# Patient Record
Sex: Male | Born: 1985 | Race: White | Hispanic: No | Marital: Single | Smoking: Former smoker
Health system: Southern US, Community
[De-identification: ages and names within clinical notes are randomized; demographics above are authoritative.]

## PROBLEM LIST (undated history)

## (undated) DIAGNOSIS — G709 Myoneural disorder, unspecified: Secondary | ICD-10-CM

## (undated) DIAGNOSIS — T8859XA Other complications of anesthesia, initial encounter: Secondary | ICD-10-CM

## (undated) DIAGNOSIS — I959 Hypotension, unspecified: Secondary | ICD-10-CM

## (undated) DIAGNOSIS — T4145XA Adverse effect of unspecified anesthetic, initial encounter: Secondary | ICD-10-CM

## (undated) DIAGNOSIS — R479 Unspecified speech disturbances: Secondary | ICD-10-CM

## (undated) DIAGNOSIS — G8250 Quadriplegia, unspecified: Secondary | ICD-10-CM

## (undated) DIAGNOSIS — K592 Neurogenic bowel, not elsewhere classified: Secondary | ICD-10-CM

## (undated) DIAGNOSIS — N319 Neuromuscular dysfunction of bladder, unspecified: Secondary | ICD-10-CM

## (undated) HISTORY — DX: Unspecified speech disturbances: R47.9

---

## 2005-04-27 ENCOUNTER — Emergency Department (HOSPITAL_COMMUNITY): Admission: EM | Admit: 2005-04-27 | Discharge: 2005-04-27 | Payer: Self-pay | Admitting: Emergency Medicine

## 2007-12-13 DIAGNOSIS — G8250 Quadriplegia, unspecified: Secondary | ICD-10-CM

## 2007-12-13 HISTORY — PX: OTHER SURGICAL HISTORY: SHX169

## 2007-12-13 HISTORY — DX: Quadriplegia, unspecified: G82.50

## 2009-06-19 ENCOUNTER — Ambulatory Visit (HOSPITAL_COMMUNITY): Admission: RE | Admit: 2009-06-19 | Discharge: 2009-06-19 | Payer: Self-pay | Admitting: Urology

## 2009-10-11 ENCOUNTER — Ambulatory Visit: Payer: Self-pay | Admitting: Diagnostic Radiology

## 2009-10-11 ENCOUNTER — Emergency Department (HOSPITAL_BASED_OUTPATIENT_CLINIC_OR_DEPARTMENT_OTHER): Admission: EM | Admit: 2009-10-11 | Discharge: 2009-10-11 | Payer: Self-pay | Admitting: Emergency Medicine

## 2009-12-12 HISTORY — PX: OTHER SURGICAL HISTORY: SHX169

## 2010-04-09 ENCOUNTER — Emergency Department (HOSPITAL_BASED_OUTPATIENT_CLINIC_OR_DEPARTMENT_OTHER): Admission: EM | Admit: 2010-04-09 | Discharge: 2010-04-09 | Payer: Self-pay | Admitting: Emergency Medicine

## 2010-08-05 ENCOUNTER — Emergency Department (HOSPITAL_BASED_OUTPATIENT_CLINIC_OR_DEPARTMENT_OTHER): Admission: EM | Admit: 2010-08-05 | Discharge: 2010-08-05 | Payer: Self-pay | Admitting: Emergency Medicine

## 2010-09-10 ENCOUNTER — Ambulatory Visit (HOSPITAL_COMMUNITY): Admission: RE | Admit: 2010-09-10 | Discharge: 2010-09-10 | Payer: Self-pay | Admitting: Gastroenterology

## 2010-12-12 HISTORY — PX: OTHER SURGICAL HISTORY: SHX169

## 2011-02-22 ENCOUNTER — Emergency Department (HOSPITAL_BASED_OUTPATIENT_CLINIC_OR_DEPARTMENT_OTHER)
Admission: EM | Admit: 2011-02-22 | Discharge: 2011-02-22 | Disposition: A | Discharge status: Nursing Home (Includes ICF) | Payer: Medicare Other | Source: Home / Self Care | Attending: Emergency Medicine | Admitting: Emergency Medicine

## 2011-02-22 ENCOUNTER — Inpatient Hospital Stay (HOSPITAL_COMMUNITY)
Admission: AD | Admit: 2011-02-22 | Discharge: 2011-02-25 | Disposition: A | Discharge status: Home Health | Payer: Medicare Other | Source: Other Acute Inpatient Hospital | Attending: Internal Medicine | Admitting: Internal Medicine

## 2011-02-22 ENCOUNTER — Emergency Department (INDEPENDENT_AMBULATORY_CARE_PROVIDER_SITE_OTHER): Payer: Medicare Other

## 2011-02-22 DIAGNOSIS — R1013 Epigastric pain: Secondary | ICD-10-CM | POA: Diagnosis present

## 2011-02-22 DIAGNOSIS — R509 Fever, unspecified: Secondary | ICD-10-CM | POA: Insufficient documentation

## 2011-02-22 DIAGNOSIS — Y929 Unspecified place or not applicable: Secondary | ICD-10-CM

## 2011-02-22 DIAGNOSIS — J189 Pneumonia, unspecified organism: Secondary | ICD-10-CM | POA: Diagnosis present

## 2011-02-22 DIAGNOSIS — N319 Neuromuscular dysfunction of bladder, unspecified: Secondary | ICD-10-CM | POA: Diagnosis present

## 2011-02-22 DIAGNOSIS — W19XXXS Unspecified fall, sequela: Secondary | ICD-10-CM

## 2011-02-22 DIAGNOSIS — A419 Sepsis, unspecified organism: Secondary | ICD-10-CM | POA: Diagnosis present

## 2011-02-22 DIAGNOSIS — R0602 Shortness of breath: Secondary | ICD-10-CM | POA: Insufficient documentation

## 2011-02-22 DIAGNOSIS — K5900 Constipation, unspecified: Secondary | ICD-10-CM | POA: Diagnosis present

## 2011-02-22 DIAGNOSIS — E876 Hypokalemia: Secondary | ICD-10-CM | POA: Diagnosis present

## 2011-02-22 DIAGNOSIS — J40 Bronchitis, not specified as acute or chronic: Secondary | ICD-10-CM | POA: Insufficient documentation

## 2011-02-22 DIAGNOSIS — R109 Unspecified abdominal pain: Secondary | ICD-10-CM

## 2011-02-22 DIAGNOSIS — R059 Cough, unspecified: Secondary | ICD-10-CM

## 2011-02-22 DIAGNOSIS — IMO0002 Reserved for concepts with insufficient information to code with codable children: Secondary | ICD-10-CM

## 2011-02-22 DIAGNOSIS — G8250 Quadriplegia, unspecified: Secondary | ICD-10-CM

## 2011-02-22 DIAGNOSIS — Z79899 Other long term (current) drug therapy: Secondary | ICD-10-CM

## 2011-02-22 DIAGNOSIS — M62838 Other muscle spasm: Secondary | ICD-10-CM | POA: Diagnosis present

## 2011-02-22 DIAGNOSIS — R062 Wheezing: Secondary | ICD-10-CM | POA: Insufficient documentation

## 2011-02-22 DIAGNOSIS — G904 Autonomic dysreflexia: Secondary | ICD-10-CM | POA: Diagnosis present

## 2011-02-22 DIAGNOSIS — X58XXXS Exposure to other specified factors, sequela: Secondary | ICD-10-CM

## 2011-02-22 DIAGNOSIS — E860 Dehydration: Secondary | ICD-10-CM | POA: Diagnosis present

## 2011-02-22 DIAGNOSIS — R05 Cough: Secondary | ICD-10-CM

## 2011-02-22 LAB — URINALYSIS, ROUTINE W REFLEX MICROSCOPIC
Ketones, ur: >80 mg/dL — AB
Nitrite: NEGATIVE
Protein, ur: NEGATIVE mg/dL
Urobilinogen, UA: 0.2 mg/dL (ref 0.0–1.0)

## 2011-02-22 LAB — COMPREHENSIVE METABOLIC PANEL
Alkaline Phosphatase: 51 U/L (ref 39–117)
BUN: 15 mg/dL (ref 6–23)
Chloride: 104 mEq/L (ref 96–112)
GFR calc non Af Amer: >60 mL/min (ref >60)
Glucose, Bld: 97 mg/dL (ref 70–99)
Potassium: 3.7 mEq/L (ref 3.5–5.1)
Total Bilirubin: 0.7 mg/dL (ref 0.3–1.2)

## 2011-02-22 LAB — CBC
MCV: 83.4 fL (ref 78.0–100.0)
Platelets: 193 10*3/uL (ref 150–400)
RDW: 13.5 % (ref 11.5–15.5)
WBC: 12.4 10*3/uL — ABNORMAL HIGH (ref 4.0–10.5)

## 2011-02-22 LAB — DIFFERENTIAL
Basophils Absolute: 0 10*3/uL (ref 0.0–0.1)
Eosinophils Absolute: 0 10*3/uL (ref 0.0–0.7)
Eosinophils Relative: 0 % (ref 0–5)
Lymphs Abs: 1.3 10*3/uL (ref 0.7–4.0)

## 2011-02-22 LAB — LIPASE, BLOOD: Lipase: 37 U/L (ref 23–300)

## 2011-02-23 LAB — BASIC METABOLIC PANEL
BUN: 8 mg/dL (ref 6–23)
CO2: 25 mEq/L (ref 19–32)
Glucose, Bld: 121 mg/dL — ABNORMAL HIGH (ref 70–99)
Potassium: 3.4 mEq/L — ABNORMAL LOW (ref 3.5–5.1)
Sodium: 140 mEq/L (ref 135–145)

## 2011-02-23 LAB — CBC
MCHC: 32.3 g/dL (ref 30.0–36.0)
RBC: 4.16 MIL/uL — ABNORMAL LOW (ref 4.22–5.81)
RDW: 13.7 % (ref 11.5–15.5)

## 2011-02-23 LAB — URINE CULTURE
Colony Count: NO GROWTH
Culture  Setup Time: 201203132005
Culture: NO GROWTH

## 2011-02-23 LAB — INFLUENZA PANEL BY PCR (TYPE A & B): Influenza B By PCR: NEGATIVE

## 2011-02-24 ENCOUNTER — Inpatient Hospital Stay (HOSPITAL_COMMUNITY): Payer: Medicare Other

## 2011-02-24 LAB — BASIC METABOLIC PANEL
CO2: 25 mEq/L (ref 19–32)
Calcium: 7.7 mg/dL — ABNORMAL LOW (ref 8.4–10.5)
Creatinine, Ser: 0.43 mg/dL (ref 0.4–1.5)
GFR calc Af Amer: >60 mL/min (ref >60)
GFR calc non Af Amer: >60 mL/min (ref >60)
Sodium: 141 mEq/L (ref 135–145)

## 2011-02-24 LAB — STREP A DNA PROBE

## 2011-02-24 LAB — CBC
MCH: 29.1 pg (ref 26.0–34.0)
MCHC: 32.7 g/dL (ref 30.0–36.0)
Platelets: 133 10*3/uL — ABNORMAL LOW (ref 150–400)
RDW: 14 % (ref 11.5–15.5)

## 2011-02-24 MED ORDER — IOHEXOL 300 MG/ML  SOLN
100.0000 mL | Freq: Once | INTRAMUSCULAR | Status: AC | PRN
  Administered 2011-02-24: 100 mL via INTRAVENOUS

## 2011-02-25 ENCOUNTER — Inpatient Hospital Stay (HOSPITAL_COMMUNITY)
Admission: EM | Admit: 2011-02-25 | Discharge: 2011-02-28 | DRG: 871 | Disposition: A | Discharge status: Home Health | Payer: Medicare Other | Source: Other Acute Inpatient Hospital | Attending: Internal Medicine | Admitting: Internal Medicine

## 2011-02-25 ENCOUNTER — Inpatient Hospital Stay (HOSPITAL_COMMUNITY)
Admission: AD | Admit: 2011-02-25 | Payer: Self-pay | Source: Other Acute Inpatient Hospital | Admitting: Internal Medicine

## 2011-02-25 ENCOUNTER — Inpatient Hospital Stay (HOSPITAL_COMMUNITY): Payer: Medicare Other

## 2011-02-25 LAB — CBC
MCH: 28.8 pg (ref 26.0–34.0)
MCHC: 33 g/dL (ref 30.0–36.0)
MCV: 87.5 fL (ref 78.0–100.0)
Platelets: 141 10*3/uL — ABNORMAL LOW (ref 150–400)
RBC: 4.16 MIL/uL — ABNORMAL LOW (ref 4.22–5.81)

## 2011-02-25 LAB — BASIC METABOLIC PANEL
BUN: 1 mg/dL — ABNORMAL LOW (ref 6–23)
Calcium: 8.3 mg/dL — ABNORMAL LOW (ref 8.4–10.5)
GFR calc non Af Amer: >60 mL/min (ref >60)
Glucose, Bld: 105 mg/dL — ABNORMAL HIGH (ref 70–99)
Sodium: 142 mEq/L (ref 135–145)

## 2011-02-26 LAB — CBC
MCH: 28.2 pg (ref 26.0–34.0)
MCV: 84.2 fL (ref 78.0–100.0)
Platelets: 181 10*3/uL (ref 150–400)
RDW: 13.5 % (ref 11.5–15.5)

## 2011-02-26 LAB — BASIC METABOLIC PANEL
BUN: 3 mg/dL — ABNORMAL LOW (ref 6–23)
Creatinine, Ser: 0.45 mg/dL (ref 0.4–1.5)
GFR calc non Af Amer: >60 mL/min (ref >60)

## 2011-02-26 LAB — MAGNESIUM: Magnesium: 2.2 mg/dL (ref 1.5–2.5)

## 2011-02-27 LAB — BASIC METABOLIC PANEL
BUN: 3 mg/dL — ABNORMAL LOW (ref 6–23)
Calcium: 8.4 mg/dL (ref 8.4–10.5)
GFR calc non Af Amer: >60 mL/min (ref >60)
Glucose, Bld: 100 mg/dL — ABNORMAL HIGH (ref 70–99)

## 2011-02-28 LAB — CULTURE, BLOOD (ROUTINE X 2)
Culture  Setup Time: 201203132001
Culture  Setup Time: 201203132001
Culture: NO GROWTH
Culture: NO GROWTH

## 2011-03-03 ENCOUNTER — Emergency Department (HOSPITAL_COMMUNITY)
Admission: EM | Admit: 2011-03-03 | Discharge: 2011-03-03 | Disposition: A | Discharge status: Home / Self Care | Payer: Medicare Other | Attending: Emergency Medicine | Admitting: Emergency Medicine

## 2011-03-03 DIAGNOSIS — IMO0002 Reserved for concepts with insufficient information to code with codable children: Secondary | ICD-10-CM | POA: Insufficient documentation

## 2011-03-03 DIAGNOSIS — G904 Autonomic dysreflexia: Secondary | ICD-10-CM | POA: Insufficient documentation

## 2011-03-03 DIAGNOSIS — X58XXXS Exposure to other specified factors, sequela: Secondary | ICD-10-CM | POA: Insufficient documentation

## 2011-03-03 DIAGNOSIS — Y846 Urinary catheterization as the cause of abnormal reaction of the patient, or of later complication, without mention of misadventure at the time of the procedure: Secondary | ICD-10-CM | POA: Insufficient documentation

## 2011-03-03 DIAGNOSIS — T83091A Other mechanical complication of indwelling urethral catheter, initial encounter: Secondary | ICD-10-CM | POA: Insufficient documentation

## 2011-03-03 DIAGNOSIS — G8250 Quadriplegia, unspecified: Secondary | ICD-10-CM | POA: Insufficient documentation

## 2011-03-03 LAB — BASIC METABOLIC PANEL
Calcium: 9.2 mg/dL (ref 8.4–10.5)
Creatinine, Ser: 0.58 mg/dL (ref 0.4–1.5)
GFR calc Af Amer: >60 mL/min (ref >60)
GFR calc non Af Amer: >60 mL/min (ref >60)

## 2011-03-03 LAB — URINALYSIS, ROUTINE W REFLEX MICROSCOPIC
Protein, ur: 100 mg/dL — AB
Urobilinogen, UA: 0.2 mg/dL (ref 0.0–1.0)

## 2011-03-03 LAB — DIFFERENTIAL
Basophils Relative: 0 % (ref 0–1)
Eosinophils Absolute: 0.3 10*3/uL (ref 0.0–0.7)
Monocytes Absolute: 1.2 10*3/uL — ABNORMAL HIGH (ref 0.1–1.0)
Monocytes Relative: 12 % (ref 3–12)
Neutro Abs: 5.6 10*3/uL (ref 1.7–7.7)

## 2011-03-03 LAB — PROTIME-INR: Prothrombin Time: 13.7 seconds (ref 11.6–15.2)

## 2011-03-03 LAB — CBC
Hemoglobin: 13.6 g/dL (ref 13.0–17.0)
MCH: 28.8 pg (ref 26.0–34.0)
MCHC: 33.5 g/dL (ref 30.0–36.0)
MCV: 85.8 fL (ref 78.0–100.0)

## 2011-03-03 LAB — URINE MICROSCOPIC-ADD ON

## 2011-03-03 LAB — ABO/RH: ABO/RH(D): A POS

## 2011-03-03 NOTE — Discharge Summary (Signed)
Curtis Mann, Curtis Mann NO.:  [id number]  MEDICAL RECORD NO.:  [id number]           PATIENT TYPE:  I  LOCATION:  5503                         FACILITY:  [facility]  PHYSICIAN:  Marinda Elk, M.D.DATE OF BIRTH:  [birth year]  DATE OF ADMISSION:  02/25/2011 DATE OF DISCHARGE:  02/28/2011                              DISCHARGE SUMMARY   PRIMARY CARE DOCTOR:  Dr. Florentina Jenny.  DISCHARGE DIAGNOSES: 1. Systemic inflammatory response syndrome. 2. Community-acquired pneumonia. 3. Abdominal pain. 4. Quadriplegia. 5. Hypokalemia.  DISCHARGE MEDICATIONS: 1. Tylenol 650 mg q.4 h. p.r.n. 2. Levaquin 750 mg p.o. daily for 5 days. 3. Senokot 2 tabs daily as needed. 4. Baclofen 20 mg one tab t.i.d. 5. Hydrocodone 5/500 one tab q.6 h. p.r.n. 6. Multivitamin 1 tab daily. 7. Oxybutynin XL 1 tab b.i.d.  PROCEDURES PERFORMED: 1. Abdominal x-ray showed no evidence of gallstone or other     cholecystitis, mild splenomegaly, small pleural effusion. 2. CT scan of the abdomen and pelvis showed left lower lobe pneumonia     with portable edema within the liver, pericholecystic fluid in the     abdomen.  These finding may represent volume overload. 3. Chest x-ray showed lower lobe pneumonia. 4. Abdominal x-ray with possible constipation.  No acute findings. 5. Chest x-ray showed hyperinflation with acute disease.  CONSULTANTS:  None.  BRIEF ADMITTING HISTORY AND PHYSICAL:  This 25 year old male with a C4 through C6 fracture in 2009 without dysreflexia and neurogenic bladder. The patient states that yesterday he started noticing some abdominal pain.  He said that he has spasm in his legs, leg spasm of its own, squeezing of his testicles and he thought he may be having pain radiating from his stomach.  He had some retching.  He was able to eat yesterday, but did not eat much today.  He has subsequently noted some wheezing.  He has not had much cough.  He states his diaphragm is  weak, however, he does admit to having sore throat, a temperature of 102. Please refer to dictation of February 22, 2011, for further details.  LABORATORY DATA ON ADMISSION:  White count of 12.4.  Lactic acid 0.7. His sodium was 142, potassium 3.7, chloride 23, glucose of 97, BUN of 15, creatinine 0.5.  LFTs within normal limits.  His lipase is 37.  His UA shows positive for ketone.  His white count is 12.4, hemoglobin of 13.9, platelet count of 193, ANC of 9.5.  His procalcitonin is less than 10.  HOSPITAL COURSE: 1. Community-acquired pneumonia.  On admission, he was admitted to     [facility].  He was put on Avelox.  With continuing spike     of fevers for 3 days, he was changed to vanco and Zosyn.  Then, he     was transferred to [facility] as per family request.  He was kept     on vanco and Zosyn for 3 days.  He did not spike any more fevers.     He was changed to Levaquin that he will continue for 5 days.  He  was discharged in stable condition. 2. SIRS probably secondary to pneumonia.  This has resolved.  At the    beginning, he was little bit hypotensive.  He responded to IV     fluids.  His strep throat was negative.  Influenza PCR was also     negative.  This was probably secondary to pneumonia.  This was     resolved with IV fluids. 3. Abdominal pain probably referred from an community-acquired     pneumonia.  This has resolved.  CT scan of the abdomen were done as     above.  LFTs remained within normal limits. 4. Hypokalemia.  This was probably secondary to dehydration.  This has     resolved. 5. Constipation.  He will remain on MiraLax and Senokot.  This has     resolved.  He will go home on Senokot p.r.n.  Vitals on the day of discharge shows temperature of 97, pulse 65, respirations 18, blood pressure 115/61 and he was satting 97% on room air.  Labs on day of discharge shows none.     Marinda Elk, M.D.     AF/MEDQ  D:  02/28/2011  T:   03/01/2011  Job:  [id number]  Electronically Signed by Lambert Keto M.D. on 03/03/2011 08:11:17 AM

## 2011-03-04 LAB — CROSSMATCH
Antibody Screen: NEGATIVE
Unit division: 0

## 2011-03-05 NOTE — H&P (Addendum)
NAMECONSUELO, Mann NO.:  [id number]  MEDICAL RECORD NO.:  [id number]           PATIENT TYPE:  I  LOCATION:  1527                         FACILITY:  [facility]  PHYSICIAN:  Calvert Cantor, M.D.     DATE OF BIRTH:  June 28, 1986  DATE OF ADMISSION:  02/22/2011 DATE OF DISCHARGE:                             HISTORY & PHYSICAL   PRIMARY CARE PHYSICIAN:  Dr. Florentina Jenny.  PRESENTING COMPLAINT:  Sent from [facility] for fever and wheezing.  HISTORY OF PRESENT ILLNESS:  This is a 25 year old male with history of C4-C6 injury after fall from a balcony which is resulted in quadriplegia, this occurred in 2009.  The patient also has autonomic dysreflexia and a neurogenic bladder and has to self-cath.  The patient states that yesterday he started noticing abdominal pain. He says that he has spasms in his legs and at times, leg spasms resultants squeezing of his testicles and he thought may be the pain was radiating from there.  He had some retching.  He was able to eat yesterday, but did not eat much today.  He subsequently noticed that he was wheezing.  He has not had much of a cough because he states his diaphragm is weak.  However, he does admit to having a sore throat and stuffy nose and body aches.  He had a temperature of 102, second 102.3 at home today and is febrile here with a temperature of 101.2.  He did receive Motrin a few hours ago and despite this continues to have a temperature.  The patient is not complaining of any chest pain.  He has not had any watery eyes or runny nose.  He does self cath, but is not prone to urinary tract infections and does not complain of any foul-smelling urine recently.  No complaints of diarrhea.  He has not had any vomiting associated with his abdominal pain.  He feels like he can eat a regular diet once again now.  PAST MEDICAL HISTORY: 1. Fall from a balcony in March 2009 resulting in C4 through C6 spinal     cord  injury.  The patient has had surgery and has a cage in his     neck. 2. 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 3. Had a bilateral biceps tendon transfer where part of his biceps was     removed and attached his triceps, this was done on November 09, 2010.  SOCIAL HISTORY:  Did smoke for about 5 years, but stopped after his injury in 2009.  Drinks occasionally.  He lives with his father who takes care of him.  No drug use.  ALLERGIES:  No known drug allergies.  FAMILY HISTORY:  Mother died of colon cancer at age 17.  Grandfather had melanoma.  HOME MEDICATIONS: 1. Baclofen 20 mg three times a day. 2. Oxybutynin 15 mg twice a day. 3. Vicodin 5/325 as needed.  He states he usually needs it twice a     day. 4. Multivitamins orally.  REVIEW OF SYSTEMS:  No recent weight loss, weight gain.  Positive for fatigue, body aches and fever.  HEENT:  No frequent headaches.  No blurred vision, double vision.  He does have sore throat and stuffy nose.  No earache.  RESPIRATORY:  No cough, but wheezing.  No significant shortness of breath.  CARDIAC:  No chest pain, palpitations, or pedal edema.  GI:  Positive for what is mentioned in HPI.  GU:  Per HPI. HEMATOLOGICALLY:  No easy bruising.  SKIN:  He did notice patches across his chest few days ago, which he associates with his autonomic dysreflexia, this resolved.  NEUROLOGICALLY:  Per HPI.  PSYCHOLOGICALLY: No anxiety, depression.  PHYSICAL EXAMINATION:  GENERAL:  Young male, sitting up in bed in no acute distress. VITAL SIGNS:  Blood pressure 112/56, pulse 97, respiratory rate 16, temperature 98.6, oxygen saturation 98% on 2 liters.  Blood pressure on arrival to the hospital is 88/53, temperature currently 101.2. HEENT:  Pupils are equal, round, and reacting to light.  Extraocular movements are intact.  Conjunctivae is pink.  Oropharynx clear. NECK:  Supple.  No thyromegaly or lymphadenopathy.  No carotid  bruits. HEART:  Regular rate and rhythm.  No murmurs, rubs or gallops. LUNGS:  Clear bilaterally.  Normal respiratory effort.  No use of accessory muscles. ABDOMEN:  Soft, tender in the epigastrium and right upper quadrant. Bowel sounds positive, nondistended. EXTREMITIES:  No cyanosis, clubbing or edema.  Pedal pulses positive. NEUROLOGICALLY:  Cranial nerves II through XII are intact.  He is able to move his arms up to shoulder.  He can flex his arm, but has no power as far as extension.  He is unable to move his risks or his fingers.  He is unable to move his lower extremities. PSYCHOLOGICALLY:  Awake, alert, and oriented.  Mood and affect normal. SKIN:  Warm and dry.  No rash or bruising.  BLOOD WORK:  WBC count is 12.4.  Rest of his CBC is normal.  Metabolic panel reveals a BUN of 15 and creatinine 0.5; therefore, his BUN- creatinine ratio is 30, rest his metabolic panel is normal.  LFTs are normal.  Lactic acid is 0.7, lipase is 37.  Urine is cloudy.  Specific gravity is little high at 1.026.  He does have ketones, but it is negative for infection.  Chest x-ray reveals underlying hyperinflation.  The patient is rotated, degraded by motion artifact.  No other acute abnormalities seen.  Abdominal two-view reveals possible constipation with moderate amount of stool in the colon.  There is distal gas and stool.  Procalcitonin is less than 0.10.  ASSESSMENT AND PLAN: 1. Sepsis.  He has a fever, leukocytosis.  May be related to his upper     respiratory tract.  Since he has a sore throat, I will request a     strep throat swab.  I will also request influenza swab.  He has     received Avelox and I will continue this.  He does not have any     pressure sores or rashes on his body.  Urine looks clean. 2. Epigastric pain tenderness with nausea.  His LFTs are normal.  He     wants to try a regular diet; therefore, I will order IV Protonix     for him and we will continue to monitor  his abdomen.  He does have     constipation on the x-ray and therefore I will order a Dulcolax     suppository for him. 3. Autonomic dysreflexia.  This is a  phenomenon where the patient     developed sudden hypertension, bradycardia.  At times, there is     blotching of skin, headache, flushing.  If this occurs, inciting     event should be found and corrected.  Usually this is     overdistention of the bladder constipation.  If no inciting event     is found, the patient can be given antihypertensives. 4. Neurogenic bladder.  He currently has a Foley catheter, which will     continue. 5. Quadriplegia. 6. Deep venous thrombosis prophylaxis with Lovenox.  TIME ON ADMISSION:  Sixty minutes.     Calvert Cantor, M.D.     SR/MEDQ  D:  02/22/2011  T:  02/22/2011  Job:  [id number]  cc:   Dr. Florentina Jenny  Electronically Signed by Calvert Cantor M.D. on 03/05/2011 05:07:42 PM

## 2011-03-17 LAB — COMPREHENSIVE METABOLIC PANEL
ALT: 25 U/L (ref 0–53)
CO2: 25 mEq/L (ref 19–32)
Calcium: 8.9 mg/dL (ref 8.4–10.5)
Creatinine, Ser: 0.6 mg/dL (ref 0.4–1.5)
GFR calc non Af Amer: >60 mL/min (ref >60)
Glucose, Bld: 99 mg/dL (ref 70–99)
Sodium: 140 mEq/L (ref 135–145)
Total Protein: 6.9 g/dL (ref 6.0–8.3)

## 2011-03-17 LAB — DIFFERENTIAL
Basophils Absolute: 0 10*3/uL (ref 0.0–0.1)
Eosinophils Relative: 1 % (ref 0–5)
Lymphocytes Relative: 6 % — ABNORMAL LOW (ref 12–46)
Lymphs Abs: 0.6 10*3/uL — ABNORMAL LOW (ref 0.7–4.0)
Neutro Abs: 7.3 10*3/uL (ref 1.7–7.7)
Neutrophils Relative %: 78 % — ABNORMAL HIGH (ref 43–77)

## 2011-03-17 LAB — CBC
Hemoglobin: 13.6 g/dL (ref 13.0–17.0)
MCHC: 34.9 g/dL (ref 30.0–36.0)
MCV: 87.7 fL (ref 78.0–100.0)
RBC: 4.44 MIL/uL (ref 4.22–5.81)
RDW: 13.4 % (ref 11.5–15.5)

## 2011-03-17 LAB — URINALYSIS, ROUTINE W REFLEX MICROSCOPIC
Glucose, UA: NEGATIVE mg/dL
Hgb urine dipstick: NEGATIVE
Ketones, ur: NEGATIVE mg/dL
Protein, ur: NEGATIVE mg/dL
pH: 5.5 (ref 5.0–8.0)

## 2011-03-17 LAB — URINE CULTURE
Colony Count: NO GROWTH
Culture: NO GROWTH

## 2011-03-17 LAB — URINE MICROSCOPIC-ADD ON

## 2011-03-20 LAB — PROTIME-INR
INR: 1.1 (ref 0.00–1.49)
Prothrombin Time: 14.2 seconds (ref 11.6–15.2)

## 2011-03-20 LAB — APTT: aPTT: 30 seconds (ref 24–37)

## 2011-03-29 NOTE — Discharge Summary (Signed)
  [name], [name] NO.:  [id number]  MEDICAL RECORD NO.:  [id number]           PATIENT TYPE:  LOCATION:                                 FACILITY:  PHYSICIAN:  Hartley Barefoot, MD    DATE OF BIRTH:  29-Jun-1986  DATE OF ADMISSION:  02/22/2011 DATE OF DISCHARGE:  02/28/2011                              DISCHARGE SUMMARY   Please, for further details, refer to discharge summary performed by Dr. Radonna Ricker on March 20th.  DISCHARGE DIAGNOSES: 1. Systemic inflammatory response syndrome. 2. Community-acquired pneumonia. 3. Abdominal pain. 4. Quadriplegia. 5. Hypokalemia.  Please refer to discharge medications dictated by Dr. Radonna Ricker.  HOSPITAL COURSE:  This is a 25 year old with C4 through C6 fracture in 2009 with a history of dysreflexia  and neurogenic bladder.  He presented to the hospital complaining of abdominal pain and cough. He had a temperature of 102.  HOSPITAL COURSE:  Community-acquired pneumonia.  The patient was admitted at [facility] and started on Avelox, then he was changed to vancomycin and Zosyn after 3 days of fever.  He was transferred to [facility] per family request.  Please, for further details, refer to prior discharge summary.  The patient was transferred in a stable condition.     Hartley Barefoot, MD     BR/MEDQ  D:  03/25/2011  T:  03/25/2011  Job:  [id number]  Electronically Signed by Hartley Barefoot MD on 03/29/2011 07:26:16 PM

## 2011-04-26 NOTE — Op Note (Signed)
Curtis Mann, Curtis Mann                  ACCOUNT NO.:  [id number]   MEDICAL RECORD NO.:  [id number]          PATIENT TYPE:  AMB   LOCATION:  DAY                          FACILITY:  [facility]   PHYSICIAN:  Sigmund I. Patsi Sears, M.D.DATE OF BIRTH:  1986-05-06   DATE OF PROCEDURE:  06/19/2009  DATE OF DISCHARGE:                               OPERATIVE REPORT   PREOPERATIVE DIAGNOSIS:  Neurogenic bladder.   POSTOPERATIVE DIAGNOSIS:  Neurogenic bladder.   PROCEDURE PERFORMED:  Flexible cystourethroscopy.   SURGEON:  Sigmund I. Patsi Sears, M.D.   RESIDENT:  Edward Qualia, MD   ANESTHESIA:  1% Lidocaine and Uro-Jet.   COMPLICATIONS:  None.   FINDINGS:  Normal anterior and posterior urethra.  Normal smooth walled  bladder.  No evidence of trabeculation.  Bladder capacity under  anesthesia easily at 300 mL.   INDICATIONS FOR PROCEDURE:  Curtis Mann is a 25 year old Caucasian male who is  quadriplegic with a cervical spine injury after a fall one year ago.  He  was seen and evaluated and his baseline urodynamics showed some  decreased compliance as well as some unstable bladder contractions.  He  also has a diagnosis of autonomic dysreflexia.  After his initial  evaluation, he had been placed on anticholinergics and he does clean  intermittent catheterization every 6 hours.  He noticed that since he  started his anticholinergics he has been able to increase his bladder  capacity to greater than 500 mL on some catheterizations.  He is brought  to the operating room for cystoscopic evaluation of his bladder.   DESCRIPTION OF PROCEDURE IN DETAIL:  Curtis Mann was brought back to the  operating room and identified via his wristband.  A preop time-out was  called to confirm correct patient, procedure and site.  He was  positioned supine and his groin was prepped and draped in the usual  sterile fashion.  Surgeon's were sterilely gowned and gloved.  1%  Lidocaine  Uro-Jet was infiltrated into the patient's  urethra.   A flexible cystoscope was inserted transurethrally into the bladder and  the anterior and posterior urethra had a normal appearance.  The bladder  was normal in shape and capacity.  There was no evidence of any  abnormal lesions, diverticulosis, or foreign bodies.  Bilateral orifices  were noted to be in the usual anatomic position and seemed to be  effluxing clear yellow urine.  The cystoscope was removed and bladder  capacity under anesthesia was noted to be easily 300 mL of water.  We  did not want to increase this to minimize his chance of autonomic  hyperreflexia.  He did not have any undue complications during this  procedure.  His bladder was emptied with a 16 French red rubber  catheter.  At that point, the procedure was terminated.   Dr. Patsi Sears was present, available and participated in all aspects of  this patient's care.   DISPOSITION:  The patient tolerated the procedure well and was  transported to the PACU in stable condition.     ______________________________  Darryll Capers  Sigmund I. Patsi Sears, M.D.  Electronically Signed    KR/MEDQ  D:  06/19/2009  T:  06/19/2009  Job:  [id number]   cc:   Vonzell Schlatter. Patsi Sears, M.D.  Fax: [phone]

## 2011-08-11 ENCOUNTER — Ambulatory Visit (HOSPITAL_COMMUNITY)
Admission: RE | Admit: 2011-08-11 | Discharge: 2011-08-11 | Disposition: A | Discharge status: Home / Self Care | Payer: Medicare Other | Source: Ambulatory Visit | Attending: Urology | Admitting: Urology

## 2011-08-11 DIAGNOSIS — G8250 Quadriplegia, unspecified: Secondary | ICD-10-CM | POA: Insufficient documentation

## 2011-08-11 DIAGNOSIS — X58XXXS Exposure to other specified factors, sequela: Secondary | ICD-10-CM | POA: Insufficient documentation

## 2011-08-11 DIAGNOSIS — Z01812 Encounter for preprocedural laboratory examination: Secondary | ICD-10-CM | POA: Insufficient documentation

## 2011-08-11 DIAGNOSIS — R32 Unspecified urinary incontinence: Secondary | ICD-10-CM | POA: Insufficient documentation

## 2011-08-11 DIAGNOSIS — IMO0002 Reserved for concepts with insufficient information to code with codable children: Secondary | ICD-10-CM | POA: Insufficient documentation

## 2011-08-11 DIAGNOSIS — N319 Neuromuscular dysfunction of bladder, unspecified: Secondary | ICD-10-CM | POA: Insufficient documentation

## 2011-08-11 DIAGNOSIS — Z79899 Other long term (current) drug therapy: Secondary | ICD-10-CM | POA: Insufficient documentation

## 2011-08-11 DIAGNOSIS — G904 Autonomic dysreflexia: Secondary | ICD-10-CM | POA: Insufficient documentation

## 2011-08-11 LAB — BASIC METABOLIC PANEL
CO2: 29 mEq/L (ref 19–32)
Chloride: 105 mEq/L (ref 96–112)
GFR calc Af Amer: >60 mL/min (ref >60)
Potassium: 3.9 mEq/L (ref 3.5–5.1)
Sodium: 141 mEq/L (ref 135–145)

## 2011-08-11 LAB — CBC
Platelets: 244 10*3/uL (ref 150–400)
RBC: 4.75 MIL/uL (ref 4.22–5.81)
RDW: 13.6 % (ref 11.5–15.5)
WBC: 6.9 10*3/uL (ref 4.0–10.5)

## 2011-08-11 LAB — SURGICAL PCR SCREEN
MRSA, PCR: NEGATIVE
Staphylococcus aureus: POSITIVE — AB

## 2011-08-12 NOTE — Op Note (Signed)
  NAMESATORU, MILICH NO.:  [id number]  MEDICAL RECORD NO.:  [id number]  LOCATION:  DAYL                         FACILITY:  [facility]  PHYSICIAN:  Sigmund I. 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  OPERATION:  Cystourethroscopy, Botox submucosal injection (200 units).  SURGEON:  Sigmund I. Patsi Sears, M.D.  ANESTHESIA:  General LMA.  PREPARATION:  After appropriate preanesthesia, the patient was brought to the operating room, placed on the operating room in dorsal supine position where general LMA anesthesia was induced.  He remained in this position, where the pubis was prepped with Betadine solution and draped in usual fashion.  REVIEW OF HISTORY:  The patient is a 25 year old C4 quadriplegic, resulting from a spinal cord injury.  The patient has a history of intermittent catheterizations by his father, but has difficulty with elevated bladder neck, and passage of the catheter.  The patient has significant incontinence, and desires Botox to help gain urinary control.  Noticed history of autonomic dysreflexia.  PROCEDURE IN DETAIL:  Cystourethroscopy was accomplished, with minimal amounts of water in order to avoid hydrodistention of the bladder. Under direct vision, reconstituted Botox was injected, 200 units in 20 cc, with 1 cc bleb injections.  The patient tolerated the procedure well.  There was no evidence of autonomic dysreflexia.  He was awakened and taken to recovery room in good condition.  I did not put Foley catheter in in order to avoid stimulation, but we will speak to his father about whether he would like to have Foley catheter placed, or would like  the intermittent cath.     Sigmund I. Patsi Sears, M.D.     SIT/MEDQ  D:  08/11/2011  T:  08/11/2011  Job:  [id number]  Electronically Signed by Jethro Bolus M.D. on 08/12/2011 03:12:33 PM

## 2011-08-13 LAB — URINE CULTURE
Culture  Setup Time: 201208301400
Special Requests: NEGATIVE

## 2012-09-07 ENCOUNTER — Emergency Department (HOSPITAL_BASED_OUTPATIENT_CLINIC_OR_DEPARTMENT_OTHER)
Admission: EM | Admit: 2012-09-07 | Discharge: 2012-09-07 | Disposition: A | Discharge status: Home / Self Care | Payer: Medicare Other | Attending: Emergency Medicine | Admitting: Emergency Medicine

## 2012-09-07 ENCOUNTER — Emergency Department (HOSPITAL_BASED_OUTPATIENT_CLINIC_OR_DEPARTMENT_OTHER): Payer: Medicare Other

## 2012-09-07 ENCOUNTER — Encounter (HOSPITAL_BASED_OUTPATIENT_CLINIC_OR_DEPARTMENT_OTHER): Payer: Self-pay | Admitting: Emergency Medicine

## 2012-09-07 DIAGNOSIS — N390 Urinary tract infection, site not specified: Secondary | ICD-10-CM

## 2012-09-07 DIAGNOSIS — G809 Cerebral palsy, unspecified: Secondary | ICD-10-CM | POA: Insufficient documentation

## 2012-09-07 LAB — URINALYSIS, ROUTINE W REFLEX MICROSCOPIC
Bilirubin Urine: NEGATIVE
Glucose, UA: NEGATIVE mg/dL
Specific Gravity, Urine: 1.008 (ref 1.005–1.030)
pH: 6 (ref 5.0–8.0)

## 2012-09-07 LAB — CBC WITH DIFFERENTIAL/PLATELET
Basophils Relative: 0 % (ref 0–1)
Eosinophils Absolute: 0.1 10*3/uL (ref 0.0–0.7)
HCT: 38.4 % — ABNORMAL LOW (ref 39.0–52.0)
Hemoglobin: 13.5 g/dL (ref 13.0–17.0)
Lymphs Abs: 2.1 10*3/uL (ref 0.7–4.0)
MCH: 30.1 pg (ref 26.0–34.0)
MCHC: 35.2 g/dL (ref 30.0–36.0)
Monocytes Absolute: 2.3 10*3/uL — ABNORMAL HIGH (ref 0.1–1.0)
Monocytes Relative: 12 % (ref 3–12)
Neutro Abs: 14.7 10*3/uL — ABNORMAL HIGH (ref 1.7–7.7)
Neutrophils Relative %: 77 % (ref 43–77)
RBC: 4.48 MIL/uL (ref 4.22–5.81)

## 2012-09-07 LAB — URINE MICROSCOPIC-ADD ON

## 2012-09-07 LAB — BASIC METABOLIC PANEL
BUN: 11 mg/dL (ref 6–23)
Chloride: 99 mEq/L (ref 96–112)
Creatinine, Ser: 0.5 mg/dL (ref 0.50–1.35)
Glucose, Bld: 114 mg/dL — ABNORMAL HIGH (ref 70–99)
Potassium: 3.9 mEq/L (ref 3.5–5.1)

## 2012-09-07 MED ORDER — CIPROFLOXACIN HCL 500 MG PO TABS: 500.0000 mg | ORAL_TABLET | Freq: Two times a day (BID) | ORAL | Status: DC

## 2012-09-07 MED ORDER — CEFTRIAXONE SODIUM 1 G IJ SOLR
1.0000 g | Freq: Once | INTRAMUSCULAR | Status: AC
  Administered 2012-09-07: 1 g via INTRAMUSCULAR
  Filled 2012-09-07: qty 10

## 2012-09-07 MED ORDER — LIDOCAINE HCL (PF) 1 % IJ SOLN
INTRAMUSCULAR | Status: AC
  Filled 2012-09-07: qty 5

## 2012-09-07 NOTE — ED Provider Notes (Signed)
History     CSN: [id number]  Arrival date & time 09/07/12  1622   First MD Initiated Contact with Patient 09/07/12 1638      Chief Complaint  Patient presents with  . Hypotension    (Consider location/radiation/quality/duration/timing/severity/associated sxs/prior treatment) HPI Comments: Patient with history of quadriplegia due to C-spine injury -- presents with several different complaints. Patient states that his blood pressure has been lower than his normal blood pressure of 80-58mmHg systolic. He also has had a fever noted at home to 101F. This has resolved without treatment. Also his heart has been "racing". He states that he has had similar symptoms in the past when he has had urinary tract infections. Patient also states that he's been having some chest "tightness" feeling like lead is sitting on his chest. Patient states that he has not had this before. He does note that he has been lifting heavier weights and has been exercising more strenuously over the past several days. He is unsure if this is contributing. Patient denies upper respiratory tract infection symptoms, cough, shortness of breath, abdominal pain, change in urination, bowel changes. Patient states he typically has a bowel movement every 2 days. It has been 3 days since his last bowel movement. No skin rashes or abscess.  The history is provided by the patient, medical records and a parent.    Past Medical History  Diagnosis Date  . Cerebral palsy     History reviewed. No pertinent past surgical history.  No family history on file.  History  Substance Use Topics  . Smoking status: Never Smoker   . Smokeless tobacco: Not on file  . Alcohol Use: No      Review of Systems  Constitutional: Positive for fever. Negative for appetite change and fatigue.  HENT: Negative for ear pain, sore throat and rhinorrhea.   Eyes: Negative for redness.  Respiratory: Positive for chest tightness. Negative for cough and  shortness of breath.   Cardiovascular: Positive for palpitations. Negative for leg swelling.  Gastrointestinal: Positive for nausea. Negative for vomiting, abdominal pain and diarrhea.  Genitourinary: Negative for decreased urine volume.  Musculoskeletal: Negative for myalgias.  Skin: Negative for rash.  Neurological: Negative for headaches.    Allergies  Review of patient's allergies indicates no known allergies.  Home Medications  No current outpatient prescriptions on file.  BP 77/53  Pulse 83  Temp 98.1 F (36.7 C) (Oral)  Resp 18  Physical Exam  Nursing note and vitals reviewed. Constitutional: He appears well-developed and well-nourished.  HENT:  Head: Normocephalic and atraumatic.  Eyes: Conjunctivae normal are normal. Right eye exhibits no discharge. Left eye exhibits no discharge.  Neck: Normal range of motion. Neck supple.  Cardiovascular: Normal rate, regular rhythm and normal heart sounds.   Pulmonary/Chest: Effort normal and breath sounds normal.  Abdominal: Soft. There is no tenderness.  Neurological: He is alert.  Skin: Skin is warm and dry.  Psychiatric: He has a normal mood and affect.    ED Course  Procedures (including critical care time)  Labs Reviewed  CBC WITH DIFFERENTIAL - Abnormal; Notable for the following:    WBC 19.1 (*)     HCT 38.4 (*)     Neutro Abs 14.7 (*)     Lymphocytes Relative 11 (*)     Monocytes Absolute 2.3 (*)     All other components within normal limits  BASIC METABOLIC PANEL - Abnormal; Notable for the following:    Glucose, Bld 114 (*)  All other components within normal limits  URINALYSIS, ROUTINE W REFLEX MICROSCOPIC - Abnormal; Notable for the following:    Color, Urine RED (*)  BIOCHEMICALS MAY BE AFFECTED BY COLOR   APPearance CLOUDY (*)     Hgb urine dipstick LARGE (*)     Ketones, ur 15 (*)     Protein, ur 100 (*)     Leukocytes, UA LARGE (*)     All other components within normal limits  URINE  MICROSCOPIC-ADD ON - Abnormal; Notable for the following:    Bacteria, UA MANY (*)     All other components within normal limits  LACTIC ACID, PLASMA  URINE CULTURE   Dg Chest 2 View  09/07/2012  *RADIOLOGY REPORT*  Clinical Data: Hypotension, fever, chest heaviness  CHEST - 2 VIEW  Comparison: 02/24/2011  Findings: Lungs are clear.  No pleural effusion or pneumothorax.  The heart is normal in size.  Cervical spine fixation hardware, incompletely visualized.  The visualized osseous structures are otherwise within normal limits.  IMPRESSION: No evidence of acute cardiopulmonary disease.   Original Report Authenticated By: Charline Bills, M.D.      1. Complicated urinary tract infection     5:03 PM Patient seen and examined. Work-up initiated. Medications ordered. D/w Dr. Bernette Mayers who will see.   Vital signs reviewed and are as follows: Filed Vitals:   09/07/12 1640  BP: 77/53  Pulse: 83  Temp: 98.1 F (36.7 C)  Resp: 18    Date: 09/07/2012  Rate: 79  Rhythm: normal sinus rhythm  QRS Axis: normal  Intervals: normal  ST/T Wave abnormalities: early repolarization  Conduction Disutrbances:none  Narrative Interpretation:   Old EKG Reviewed: none available  6:58 PM UA shows urinary tract infection. Patient and father informed. Patient continues to appear well. Will give IM Rocephin in ED at request of patient. Will discharge home on oral Cipro, 14 day course. Patient urged to followup with his primary care physician or his urologist next week for recheck of his urine. Urged to return sooner if he has high persistent fever, persistent vomiting, or any other symptoms that concern him. Patient and father verbalized understanding plan.  BP 84/57  Pulse 73  Temp 98.1 F (36.7 C) (Oral)  Resp 16  SpO2 97%   MDM  Patient with urinary tract infection. Patient has a history of quadriplegia and has atypical presentation of symptoms. He appears well, nontoxic. He's been afebrile in  emergency department. Patient's blood pressure is typically low. The blood pressure taken in emergency department is not unusual for him. I do not think that the patient is exhibiting sepsis. His lactic acid is normal.  Chest pain, tightness likely a typical symptom of his UTI or do to recent physical activity.        Renne Crigler, [place] 09/07/12 1902

## 2012-09-07 NOTE — ED Provider Notes (Signed)
Medical screening examination/treatment/procedure(s) were conducted as a shared visit with non-physician practitioner(s) and myself.  I personally evaluated the patient during the encounter  Pt with history of quadriplegia who typically self-caths but occasionally when he begins to have urine leaking he will place a foley in for a few days. 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 He has clear UTI on evaluation here but is otherwise non-toxic appearing and does not appear to be septic. He was advised to remove his foley catheter after a couple of days of antibiotics. Return for any other concerns.   Lannie Heaps B. Bernette Mayers, MD 09/07/12 1610

## 2012-09-07 NOTE — ED Notes (Signed)
Low b/p and intermittent fever since yesterday

## 2012-09-10 LAB — URINE CULTURE: Colony Count: >=100000

## 2012-09-11 NOTE — ED Notes (Signed)
+   Urine Patient treated with Cipro-sensitive to same-chart appended per protocol MD. 

## 2012-09-19 ENCOUNTER — Other Ambulatory Visit: Payer: Self-pay | Admitting: Urology

## 2012-10-02 ENCOUNTER — Encounter (HOSPITAL_COMMUNITY): Payer: Self-pay | Admitting: *Deleted

## 2012-10-02 ENCOUNTER — Encounter (HOSPITAL_COMMUNITY): Payer: Self-pay | Admitting: Pharmacy Technician

## 2012-10-02 NOTE — Progress Notes (Signed)
09-07-12 ekg epic 09-07-12 chest 2 view xray epic

## 2012-10-11 ENCOUNTER — Encounter (HOSPITAL_COMMUNITY): Payer: Self-pay | Admitting: Certified Registered Nurse Anesthetist

## 2012-10-11 ENCOUNTER — Encounter (HOSPITAL_COMMUNITY)
Admission: RE | Disposition: A | Discharge status: Home / Self Care | Payer: Self-pay | Source: Ambulatory Visit | Attending: Urology

## 2012-10-11 ENCOUNTER — Ambulatory Visit (HOSPITAL_COMMUNITY)
Admission: RE | Admit: 2012-10-11 | Discharge: 2012-10-11 | Disposition: A | Discharge status: Home / Self Care | Payer: Medicare Other | Source: Ambulatory Visit | Attending: Urology | Admitting: Urology

## 2012-10-11 ENCOUNTER — Ambulatory Visit (HOSPITAL_COMMUNITY): Payer: Medicare Other | Admitting: Certified Registered Nurse Anesthetist

## 2012-10-11 ENCOUNTER — Encounter (HOSPITAL_COMMUNITY): Payer: Self-pay

## 2012-10-11 ENCOUNTER — Encounter (HOSPITAL_COMMUNITY): Payer: Self-pay | Admitting: *Deleted

## 2012-10-11 DIAGNOSIS — Z87440 Personal history of urinary (tract) infections: Secondary | ICD-10-CM | POA: Insufficient documentation

## 2012-10-11 DIAGNOSIS — Z79899 Other long term (current) drug therapy: Secondary | ICD-10-CM | POA: Insufficient documentation

## 2012-10-11 DIAGNOSIS — IMO0002 Reserved for concepts with insufficient information to code with codable children: Secondary | ICD-10-CM | POA: Insufficient documentation

## 2012-10-11 DIAGNOSIS — N319 Neuromuscular dysfunction of bladder, unspecified: Secondary | ICD-10-CM | POA: Insufficient documentation

## 2012-10-11 DIAGNOSIS — N3020 Other chronic cystitis without hematuria: Secondary | ICD-10-CM | POA: Insufficient documentation

## 2012-10-11 DIAGNOSIS — N3941 Urge incontinence: Secondary | ICD-10-CM | POA: Insufficient documentation

## 2012-10-11 HISTORY — DX: Neurogenic bowel, not elsewhere classified: K59.2

## 2012-10-11 HISTORY — DX: Hypotension, unspecified: I95.9

## 2012-10-11 HISTORY — PX: CYSTOSCOPY WITH INJECTION: SHX1424

## 2012-10-11 HISTORY — DX: Quadriplegia, unspecified: G82.50

## 2012-10-11 HISTORY — DX: Neuromuscular dysfunction of bladder, unspecified: N31.9

## 2012-10-11 LAB — CBC
HCT: 41.5 % (ref 39.0–52.0)
MCV: 85 fL (ref 78.0–100.0)
RBC: 4.88 MIL/uL (ref 4.22–5.81)
WBC: 7.7 10*3/uL (ref 4.0–10.5)

## 2012-10-11 LAB — SURGICAL PCR SCREEN: Staphylococcus aureus: POSITIVE — AB

## 2012-10-11 SURGERY — CYSTOSCOPY, WITH INJECTION OF BLADDER NECK OR BLADDER WALL
Anesthesia: General | Site: Bladder | Wound class: Clean Contaminated

## 2012-10-11 MED ORDER — ONABOTULINUMTOXINA 100 UNITS IJ SOLR
INTRAMUSCULAR | Status: DC | PRN
  Administered 2012-10-11: 200 [IU] via INTRAMUSCULAR

## 2012-10-11 MED ORDER — STERILE WATER FOR IRRIGATION IR SOLN
Status: DC | PRN
  Administered 2012-10-11: 3000 mL via INTRAVESICAL

## 2012-10-11 MED ORDER — CEFAZOLIN SODIUM-DEXTROSE 2-3 GM-% IV SOLR
2.0000 g | INTRAVENOUS | Status: AC
  Administered 2012-10-11: 2 g via INTRAVENOUS

## 2012-10-11 MED ORDER — LACTATED RINGERS IV SOLN: INTRAVENOUS | Status: DC

## 2012-10-11 MED ORDER — HYDROCODONE-ACETAMINOPHEN 5-325 MG PO TABS: 1.0000 | ORAL_TABLET | Freq: Four times a day (QID) | ORAL | Status: DC | PRN

## 2012-10-11 MED ORDER — PROMETHAZINE HCL 25 MG/ML IJ SOLN: 6.2500 mg | INTRAMUSCULAR | Status: DC | PRN

## 2012-10-11 MED ORDER — MIDAZOLAM HCL 5 MG/5ML IJ SOLN
INTRAMUSCULAR | Status: DC | PRN
  Administered 2012-10-11: 2 mg via INTRAVENOUS

## 2012-10-11 MED ORDER — ONDANSETRON HCL 4 MG/2ML IJ SOLN
INTRAMUSCULAR | Status: DC | PRN
  Administered 2012-10-11: 4 mg via INTRAVENOUS

## 2012-10-11 MED ORDER — CEFAZOLIN SODIUM-DEXTROSE 2-3 GM-% IV SOLR
INTRAVENOUS | Status: AC
  Filled 2012-10-11: qty 50

## 2012-10-11 MED ORDER — LIDOCAINE HCL 1 % IJ SOLN
INTRAMUSCULAR | Status: DC | PRN
  Administered 2012-10-11: 50 mg via INTRADERMAL

## 2012-10-11 MED ORDER — LACTATED RINGERS IV SOLN
INTRAVENOUS | Status: DC
  Administered 2012-10-11: 1000 mL via INTRAVENOUS

## 2012-10-11 MED ORDER — METHENAMINE MAND-SOD PHOSPHATE 500-500 MG PO TABS: 1.0000 | ORAL_TABLET | Freq: Every day | ORAL | Status: DC

## 2012-10-11 MED ORDER — LACTATED RINGERS IV SOLN
INTRAVENOUS | Status: DC | PRN
  Administered 2012-10-11: 10:00:00 via INTRAVENOUS

## 2012-10-11 MED ORDER — FENTANYL CITRATE 0.05 MG/ML IJ SOLN: 25.0000 ug | INTRAMUSCULAR | Status: DC | PRN

## 2012-10-11 MED ORDER — MUPIROCIN 2 % EX OINT
TOPICAL_OINTMENT | Freq: Two times a day (BID) | CUTANEOUS | Status: DC
  Administered 2012-10-11: 1 via NASAL
  Filled 2012-10-11: qty 22

## 2012-10-11 MED ORDER — ONABOTULINUMTOXINA 100 UNITS IJ SOLR
400.0000 [IU] | Freq: Once | INTRAMUSCULAR | Status: DC
  Filled 2012-10-11: qty 400

## 2012-10-11 MED ORDER — OXYBUTYNIN CHLORIDE ER 15 MG PO TB24
30.0000 mg | ORAL_TABLET | Freq: Every morning | ORAL | Status: DC
Start: 2012-10-11 — End: 2012-10-11
  Filled 2012-10-11 (×2): qty 2

## 2012-10-11 MED ORDER — FENTANYL CITRATE 0.05 MG/ML IJ SOLN
INTRAMUSCULAR | Status: DC | PRN
  Administered 2012-10-11: 25 ug via INTRAVENOUS

## 2012-10-11 MED ORDER — PROPOFOL 10 MG/ML IV EMUL
INTRAVENOUS | Status: DC | PRN
  Administered 2012-10-11: 200 mg via INTRAVENOUS

## 2012-10-11 MED ORDER — OXYBUTYNIN CHLORIDE ER 15 MG PO TB24: 30.0000 mg | ORAL_TABLET | Freq: Every morning | ORAL | Status: DC

## 2012-10-11 SURGICAL SUPPLY — 19 items
BAG URO CATCHER STRL LF (DRAPE) ×2 IMPLANT
CATH ROBINSON RED A/P 14FR (CATHETERS) ×2 IMPLANT
CLOTH BEACON ORANGE TIMEOUT ST (SAFETY) ×2 IMPLANT
DEFLUX NEEDLE (NEEDLE) IMPLANT
DEFLUX SYRINGE (SYRINGE) IMPLANT
DRAPE CAMERA CLOSED 9X96 (DRAPES) ×2 IMPLANT
GLOVE BIOGEL M STRL SZ7.5 (GLOVE) ×2 IMPLANT
GOWN STRL NON-REIN LRG LVL3 (GOWN DISPOSABLE) ×2 IMPLANT
GOWN STRL REIN XL XLG (GOWN DISPOSABLE) ×2 IMPLANT
MANIFOLD NEPTUNE II (INSTRUMENTS) ×2 IMPLANT
NDL SAFETY ECLIPSE 18X1.5 (NEEDLE) IMPLANT
NDL SPNL 22GX7 QUINCKE BK (NEEDLE) ×1 IMPLANT
NEEDLE HYPO 18GX1.5 SHARP (NEEDLE) ×4
NEEDLE SPNL 22GX7 QUINCKE BK (NEEDLE) ×2 IMPLANT
PACK CYSTO (CUSTOM PROCEDURE TRAY) ×2 IMPLANT
SCRUB PCMX 4 OZ (MISCELLANEOUS) ×2 IMPLANT
SYR CONTROL 10ML LL (SYRINGE) ×4 IMPLANT
TUBING CONNECTING 10 (TUBING) IMPLANT
WATER STERILE IRR 3000ML UROMA (IV SOLUTION) ×2 IMPLANT

## 2012-10-11 NOTE — Op Note (Signed)
Pre-operative diagnosis :  Neurogenic Bladder with urinary incontinence  Postoperative diagnosis:Same  Operation:Cystoscopy, Botox injection ( 200 units)  Surgeon:  S. Patsi Sears, MD  First assistant : None  Anesthesia: General LMA  Preparation:  After appropriate premedication, the patient was brought into the OR and placed upon the OR table in the supine position, and the armband was double checked. He then received general LMA anesthesia, and was replaced in the dorsal lithotomy position.   Review history:  26 yo male, C-5, C-6 closed trauma, status post cystoscopy, Botox injection (200 units) 08/11/11 for neurogenic bladder-hyperreflexia secondary to C5-6 spinal injury. Botox took 2 weeks to begin to work. CIC QID will leak mildly late in day but this is after several coffees/day. Can now go 7-8 hrs from bedtime to am CIC. Has been taking additional oxybutinin, but is having constipation and dry mouth.  Interval HX:  Recently seen in ER 09/08/12 for fever (101) and UTI. Had (+) Klebsiella UTI sensitive to both Ceftriaxone and Cipro which he is still taking. Has chronic foley at this time due to increased UI with UTI. Does typically CIC Q4-6 hrs but over past several weeks this has progressively worsened and now he feels it is time for repeat Botox to bladder. No hematuria. Urine is clear at this time.    Statement of  Likelihood of Success: Excellent. TIME-OUT observed.:  Procedure:  Cysto showed small bilateral prostatic lobes. Urethra wnl, without scar. Bladder neck wnl.  Bladder base normal. Trigone normal. 20cc of Botox, 1cc/injection injected, for a total of 200 unit injection. No bleeding. No complication. Bladder drained and scope removed. Pt was awakened and taken to recovery room in good condition.

## 2012-10-11 NOTE — Interval H&P Note (Signed)
History and Physical Interval Note:  10/11/2012 8:43 AM  Curtis Mann  has presented today for surgery, with the diagnosis of neurogenic bladder   The various methods of treatment have been discussed with the patient and family. After consideration of risks, benefits and other options for treatment, the patient has consented to  Procedure(s) (LRB) with comments: CYSTOSCOPY WITH INJECTION (N/A) - botox as a surgical intervention .  The patient's history has been reviewed, patient examined, no change in status, stable for surgery.  I have reviewed the patient's chart and labs.  Questions were answered to the patient's satisfaction.     Jethro Bolus I

## 2012-10-11 NOTE — Anesthesia Preprocedure Evaluation (Signed)
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 negative psych ROS   GI/Hepatic negative GI ROS, Neg liver ROS,   Endo/Other  negative endocrine ROS  Renal/GU negative Renal ROS  negative genitourinary   Musculoskeletal negative musculoskeletal ROS (+)   Abdominal   Peds  Hematology negative hematology ROS (+)   Anesthesia Other Findings   Reproductive/Obstetrics negative OB ROS                           Anesthesia Physical Anesthesia Plan  ASA: III  Anesthesia Plan: General   Post-op Pain Management:    Induction: Intravenous  Airway Management Planned: LMA  Additional Equipment:   Intra-op Plan:   Post-operative Plan: Extubation in OR  Informed Consent: I have reviewed the patients History and Physical, chart, labs and discussed the procedure including the risks, benefits and alternatives for the proposed anesthesia with the patient or authorized representative who has indicated his/her understanding and acceptance.   Dental advisory given  Plan Discussed with: CRNA  Anesthesia Plan Comments:         Anesthesia Quick Evaluation

## 2012-10-11 NOTE — Transfer of Care (Signed)
Immediate Anesthesia Transfer of Care Note  Patient: Curtis Mann  Procedure(s) Performed: Procedure(s) (LRB) with comments: CYSTOSCOPY WITH INJECTION (N/A) - botox  Patient Location: PACU  Anesthesia Type:General  Level of Consciousness: awake and alert   Airway & Oxygen Therapy: Patient Spontanous Breathing  Post-op Assessment: Report given to PACU RN and Post -op Vital signs reviewed and stable  Post vital signs: Reviewed and stable  Complications: No apparent anesthesia complications

## 2012-10-11 NOTE — Anesthesia Postprocedure Evaluation (Signed)
Anesthesia Post Note  Patient: Curtis Mann  Procedure(s) Performed: Procedure(s) (LRB): CYSTOSCOPY WITH INJECTION (N/A)  Anesthesia type: General  Patient location: PACU  Post pain: Pain level controlled  Post assessment: Post-op Vital signs reviewed  Last Vitals:  Filed Vitals:   10/11/12 1130  BP: 71/16  Pulse: 74  Temp: 36.7 C  Resp: 20    Post vital signs: Reviewed  Level of consciousness: sedated  Complications: No apparent anesthesia complications

## 2012-10-11 NOTE — H&P (Signed)
1. Chronic Cystitis 595.2 2. Urge Incontinence Of Urine 788.31  History of Present Illness  GU HX:   C-5, C-6 closed trauma, status post cystoscopy, Botox injection (200 units) 08/11/11 for neurogenic bladder-hyperreflexia secondary to C5-6 spinal injury. Botox took 2 weeks to begin to work. CIC QID will leak mildly late in day but this is after several coffees/day. Can now go 7-8 hrs from bedtime to am CIC. Has been taking additional oxybutinin, but is having constipation and dry mouth.   Interval HX:  Recently seen in ER 09/08/12 for fever (101) and UTI. Had (+) Klebsiella UTI sensitive to both Ceftriaxone and Cipro which he is still taking. Has chronic foley at this time due to increased UI with UTI. Does typically CIC Q4-6 hrs but over past several weeks this has progressively worsened and now he feels it is time for repeat Botox to bladder. No hematuria. Urine is clear at this time.   Past Medical History Problems  1. History of  No Medical Problems  Surgical History Problems  1. History of  Cervical Vertebral Fusion 2. History of  Cystoscopy (Diagnostic)  Current Meds 1. Baclofen 20 MG Oral Tablet; Therapy: (Recorded:24Aug2011) to 2. Colace CAPS; Therapy: (Recorded:03Jul2013) to 3. Cranberry Concentrate CAPS; Therapy: (Recorded:26Mar2012) to 4. Hydrocodone-Acetaminophen 5-325 MG Oral Tablet; Therapy: 14Apr2013 to 5. Ibuprofen CAPS; Therapy: (Recorded:03Jul2013) to 6. Multi-Vitamin TABS; Therapy: (Recorded:26Mar2012) to 7. Myrbetriq 50 MG Oral Tablet Extended Release 24 Hour; Take 1 tablet daily; Therapy: 03Jul2013  to (Evaluate:28Jun2014)  Requested for: 03Jul2013; Last Rx:03Jul2013 8. Nitrofurantoin Macrocrystal 100 MG Oral Capsule; One capsule 2X/day for 10 days, then 1/day.   Take with food. MDD:2; Therapy: 26Mar2012 to (Evaluate:29Jan2014)  Requested for: 03Jul2013;  Last Rx:03Jul2013 9. Oxybutynin Chloride ER 15 MG Oral Tablet Extended Release 24 Hour; TAKE 2 TABLETS BY  MOUTH EVERY DAY; Therapy: 09Jun2010 to (Evaluate:29Oct2013)  Requested for: 03May2013;  Last Rx:02May2013  Allergies Medication  1. No Known Drug Allergies  Family History Problems  1. Maternal history of  Colon Cancer V16.0 2. Family history of  Family Health Status - Father's Age 26 3. Family history of  Mother Deceased At Age ____ 26, colon cancer 4. Family history of  Prostate Cancer V16.42  Social History Problems  1. Caffeine Use 1 per day 2. Former Smoker smoked x 3 yrs, quit 1 yr ago 3. Marital History - Single 4. Occupation: disabled Denied  5. History of  Alcohol Use  Review of Systems Genitourinary, constitutional, skin, eye, otolaryngeal, hematologic/lymphatic, cardiovascular, pulmonary, endocrine, musculoskeletal, gastrointestinal, neurological and psychiatric system(s) were reviewed and pertinent findings if present are noted.  Genitourinary: feelings of urinary urgency and incontinence.    Vitals Vital Signs [Data Includes: Last 1 Day]  08Oct2013 03:04PM  BMI Calculated: 17.34 BSA Calculated: 1.76 Weight: 128 lb  Blood Pressure: 83 / 54 Temperature: 96.2 F Heart Rate: 68  Physical Exam Constitutional: Well nourished and well developed . No acute distress. The patient appears well hydrated.  Abdomen: The abdomen is flat. The abdomen is soft and nontender. No suprapubic tenderness. No CVA tenderness.  Skin: Normal skin turgor and normal skin color and pigmentation.  Neuro/Psych:. Mood and affect are appropriate.    Results/Data  Old records or history reviewed: Reviewed ER records and C&S.  The following images/tracing/specimen were independently visualized:  RUS: right kidney: 11.3 X 0.86 X 4.22 X 5.37 cm. Left kidney: 11.09 X 0.96 X 5.28 X 4.19 cm. Both appear normal.  PVR: Ultrasound PVR Cath ballon seen. 12  ml ml.    Assessment Assessed  1. Urge Incontinence Of Urine 788.31 2. Chronic Cystitis 595.2   Will have pt complete Cipro as RX'd. He  will remove foley at completion of ABX. Will schedule pt for cysto/Botox with Dr. Patsi Sears. Risks and benefits discussed which include: bleeding, infection, perforation of bladder with injection. All questions addressed and wishes to proceed.   Plan Chronic Cystitis (595.2), Urinary Incontinence Without Sensory Awareness (788.34)  1. Follow-up Month x 6 Office  Follow-up  Requested for: 08Oct2013 Quadriparesis At C5 (344.04)  2. 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 Myrbetriq 50 MG Oral Tablet Extended Release 24 Hour; Take 1 tablet daily; Therapy: 03Jul2013  to 08Oct2013; Last Rx:03Jul2013; Status: DISCONTINUED

## 2012-10-12 ENCOUNTER — Encounter (HOSPITAL_COMMUNITY): Payer: Self-pay | Admitting: Urology

## 2013-06-11 ENCOUNTER — Other Ambulatory Visit (HOSPITAL_COMMUNITY): Payer: Self-pay | Admitting: Family Medicine

## 2013-06-11 DIAGNOSIS — M810 Age-related osteoporosis without current pathological fracture: Secondary | ICD-10-CM

## 2013-06-11 DIAGNOSIS — G8253 Quadriplegia, C5-C7 complete: Secondary | ICD-10-CM

## 2013-06-11 DIAGNOSIS — G8252 Quadriplegia, C1-C4 incomplete: Secondary | ICD-10-CM

## 2013-06-25 ENCOUNTER — Ambulatory Visit (HOSPITAL_COMMUNITY): Payer: Medicare Other

## 2013-06-26 ENCOUNTER — Ambulatory Visit (HOSPITAL_COMMUNITY)
Admission: RE | Admit: 2013-06-26 | Discharge: 2013-06-26 | Disposition: A | Discharge status: Home / Self Care | Payer: Medicare Other | Source: Ambulatory Visit | Attending: Family Medicine | Admitting: Family Medicine

## 2013-06-26 DIAGNOSIS — G8253 Quadriplegia, C5-C7 complete: Secondary | ICD-10-CM

## 2013-06-26 DIAGNOSIS — G8252 Quadriplegia, C1-C4 incomplete: Secondary | ICD-10-CM | POA: Insufficient documentation

## 2013-06-26 DIAGNOSIS — M810 Age-related osteoporosis without current pathological fracture: Secondary | ICD-10-CM

## 2013-08-06 ENCOUNTER — Emergency Department (HOSPITAL_BASED_OUTPATIENT_CLINIC_OR_DEPARTMENT_OTHER): Payer: Medicare Other

## 2013-08-06 ENCOUNTER — Encounter (HOSPITAL_BASED_OUTPATIENT_CLINIC_OR_DEPARTMENT_OTHER): Payer: Self-pay | Admitting: *Deleted

## 2013-08-06 ENCOUNTER — Emergency Department (HOSPITAL_BASED_OUTPATIENT_CLINIC_OR_DEPARTMENT_OTHER)
Admission: EM | Admit: 2013-08-06 | Discharge: 2013-08-07 | Disposition: A | Discharge status: Home / Self Care | Payer: Medicare Other | Attending: Emergency Medicine | Admitting: Emergency Medicine

## 2013-08-06 DIAGNOSIS — R059 Cough, unspecified: Secondary | ICD-10-CM | POA: Insufficient documentation

## 2013-08-06 DIAGNOSIS — R509 Fever, unspecified: Secondary | ICD-10-CM | POA: Insufficient documentation

## 2013-08-06 DIAGNOSIS — Z8669 Personal history of other diseases of the nervous system and sense organs: Secondary | ICD-10-CM | POA: Insufficient documentation

## 2013-08-06 DIAGNOSIS — Z79899 Other long term (current) drug therapy: Secondary | ICD-10-CM | POA: Insufficient documentation

## 2013-08-06 DIAGNOSIS — Z8679 Personal history of other diseases of the circulatory system: Secondary | ICD-10-CM | POA: Insufficient documentation

## 2013-08-06 DIAGNOSIS — Z8719 Personal history of other diseases of the digestive system: Secondary | ICD-10-CM | POA: Insufficient documentation

## 2013-08-06 DIAGNOSIS — R112 Nausea with vomiting, unspecified: Secondary | ICD-10-CM | POA: Insufficient documentation

## 2013-08-06 DIAGNOSIS — M542 Cervicalgia: Secondary | ICD-10-CM | POA: Insufficient documentation

## 2013-08-06 DIAGNOSIS — Z87448 Personal history of other diseases of urinary system: Secondary | ICD-10-CM | POA: Insufficient documentation

## 2013-08-06 DIAGNOSIS — N12 Tubulo-interstitial nephritis, not specified as acute or chronic: Secondary | ICD-10-CM | POA: Insufficient documentation

## 2013-08-06 DIAGNOSIS — Z87891 Personal history of nicotine dependence: Secondary | ICD-10-CM | POA: Insufficient documentation

## 2013-08-06 DIAGNOSIS — R51 Headache: Secondary | ICD-10-CM | POA: Insufficient documentation

## 2013-08-06 DIAGNOSIS — R05 Cough: Secondary | ICD-10-CM | POA: Insufficient documentation

## 2013-08-06 LAB — BASIC METABOLIC PANEL
Calcium: 9.3 mg/dL (ref 8.4–10.5)
Creatinine, Ser: 0.5 mg/dL (ref 0.50–1.35)
GFR calc Af Amer: >90 mL/min (ref >90)
GFR calc non Af Amer: >90 mL/min (ref >90)
Sodium: 134 mEq/L — ABNORMAL LOW (ref 135–145)

## 2013-08-06 LAB — URINALYSIS, ROUTINE W REFLEX MICROSCOPIC
Ketones, ur: 40 mg/dL — AB
Nitrite: NEGATIVE
Specific Gravity, Urine: 1.004 — ABNORMAL LOW (ref 1.005–1.030)
pH: 6.5 (ref 5.0–8.0)

## 2013-08-06 LAB — CBC WITH DIFFERENTIAL/PLATELET
Basophils Absolute: 0 10*3/uL (ref 0.0–0.1)
Basophils Relative: 0 % (ref 0–1)
Eosinophils Relative: 0 % (ref 0–5)
HCT: 38.5 % — ABNORMAL LOW (ref 39.0–52.0)
MCHC: 34 g/dL (ref 30.0–36.0)
MCV: 87.5 fL (ref 78.0–100.0)
Monocytes Absolute: 3.2 10*3/uL — ABNORMAL HIGH (ref 0.1–1.0)
Neutro Abs: 20.4 10*3/uL — ABNORMAL HIGH (ref 1.7–7.7)
Platelets: 180 10*3/uL (ref 150–400)
RDW: 13.2 % (ref 11.5–15.5)

## 2013-08-06 LAB — CSF CELL COUNT WITH DIFFERENTIAL
RBC Count, CSF: 0 /mm3
WBC, CSF: 1 /mm3 (ref 0–5)

## 2013-08-06 LAB — GLUCOSE, CSF: Glucose, CSF: 87 mg/dL — ABNORMAL HIGH (ref 43–76)

## 2013-08-06 LAB — URINE MICROSCOPIC-ADD ON

## 2013-08-06 LAB — HEPATIC FUNCTION PANEL
Indirect Bilirubin: 0.4 mg/dL (ref 0.3–0.9)
Total Protein: 7 g/dL (ref 6.0–8.3)

## 2013-08-06 MED ORDER — ACETAMINOPHEN 325 MG PO TABS
650.0000 mg | ORAL_TABLET | Freq: Four times a day (QID) | ORAL | Status: DC | PRN
  Administered 2013-08-06: 650 mg via ORAL
  Filled 2013-08-06: qty 2

## 2013-08-06 MED ORDER — ACETAMINOPHEN 325 MG PO TABS: 650.0000 mg | ORAL_TABLET | Freq: Once | ORAL | Status: DC

## 2013-08-06 MED ORDER — DEXTROSE 5 % IV SOLN
1.0000 g | Freq: Once | INTRAVENOUS | Status: AC
  Administered 2013-08-06: 1 g via INTRAVENOUS
  Filled 2013-08-06: qty 10

## 2013-08-06 NOTE — ED Notes (Addendum)
Fever, nausea, chills, neck pain on the right side into his head. Patient took tylenol at noon today for his fever.

## 2013-08-06 NOTE — ED Provider Notes (Signed)
CSN: [id number]     Arrival date & time 08/06/13  1626 History   First MD Initiated Contact with Patient 08/06/13 1745     Chief Complaint  Patient presents with  . Fever   (Consider location/radiation/quality/duration/timing/severity/associated sxs/prior Treatment) Patient is a 27 y.o. male presenting with fever. The history is provided by the patient and a parent.  Fever Max temp prior to arrival:  103 Temp source:  Oral Severity:  Moderate Onset quality:  Sudden Duration:  3 days Timing:  Intermittent Progression:  Waxing and waning Chronicity:  New Associated symptoms: cough, headaches (when the fever is high, then when fever is gone he has no headache), nausea and vomiting   Associated symptoms: no chest pain, no confusion, no diarrhea, no ear pain, no rhinorrhea and no sore throat   Risk factors comment:  Paraplegia   Past Medical History  Diagnosis Date  . Quadriplegic spinal paralysis 2009  . Hypotension occasional  . Neurogenic bladder     foley and i and o caths  . Neurogenic bowel     bowel program with digital stimulation as needed   Past Surgical History  Procedure Laterality Date  . Surgery for spinal cord injury c 4 to c 6  2009  . Bladder botox surgery  2012  . Bicep and tricept tendon transfers bilateral  2011  . Cystoscopy with injection  10/11/2012    Procedure: CYSTOSCOPY WITH INJECTION;  Surgeon: Kathi Ludwig, MD;  Location: [facility];  Service: Urology;  Laterality: N/A;  botox   No family history on file. History  Substance Use Topics  . Smoking status: Former Smoker -- 0.25 packs/day for 4 years    Types: Cigarettes    Quit date: 12/13/2007  . Smokeless tobacco: Never Used  . Alcohol Use: No    Review of Systems  Constitutional: Positive for fever.  HENT: Positive for neck pain (left neck hurts after sleeping, no pain now). Negative for ear pain, sore throat and rhinorrhea.   Respiratory: Positive for cough.   Cardiovascular: Negative  for chest pain.  Gastrointestinal: Positive for nausea and vomiting. Negative for abdominal pain, diarrhea and abdominal distention.  Genitourinary:       Has not noticed any typical changes in his urine that he usually has with UTIs  Musculoskeletal: Negative for back pain.  Neurological: Positive for headaches (when the fever is high, then when fever is gone he has no headache).  Psychiatric/Behavioral: Negative for confusion.  All other systems reviewed and are negative.    Allergies  Review of patient's allergies indicates no known allergies.  Home Medications   Current Outpatient Rx  Name  Route  Sig  Dispense  Refill  . baclofen (LIORESAL) 20 MG tablet   Oral   Take 20 mg by mouth 3 (three) times daily.         Marland Kitchen CRANBERRY PO   Oral   Take 2 capsules by mouth daily.         Marland Kitchen HYDROcodone-acetaminophen (NORCO/VICODIN) 5-325 MG per tablet   Oral   Take 1 tablet by mouth every 6 (six) hours as needed. Pain         . HYDROcodone-acetaminophen (NORCO/VICODIN) 5-325 MG per tablet   Oral   Take 1 tablet by mouth every 6 (six) hours as needed.   30 tablet   0   . methenamine-sodium biphosphonate (UROQUID) 500-500 MG per tablet   Oral   Take 1 tablet by mouth daily.  30 tablet   6   . Multiple Vitamin (MULTIVITAMIN) capsule   Oral   Take 1 capsule by mouth daily.         Marland Kitchen oxybutynin (DITROPAN XL) 15 MG 24 hr tablet   Oral   Take 30 mg by mouth every morning.          Marland Kitchen oxybutynin (DITROPAN XL) 15 MG 24 hr tablet   Oral   Take 2 tablets (30 mg total) by mouth every morning.   30 tablet   6    BP 95/57  Pulse 103  Temp(Src) 101.8 F (38.8 C) (Oral)  Resp 18  Ht 6' (1.829 m)  Wt 130 lb (58.968 kg)  BMI 17.63 kg/m2  SpO2 97% Physical Exam  Nursing note and vitals reviewed. Constitutional: He is oriented to person, place, and time. He appears well-developed and well-nourished.  HENT:  Head: Normocephalic and atraumatic.  Right Ear: External  ear normal.  Left Ear: External ear normal.  Nose: Nose normal.  Mouth/Throat: Oropharynx is clear and moist. No oropharyngeal exudate.  Eyes: EOM are normal. Pupils are equal, round, and reactive to light. Right eye exhibits no discharge. Left eye exhibits no discharge.  Neck: Normal range of motion and full passive range of motion without pain. Neck supple. No spinous process tenderness and no muscular tenderness present.  Cardiovascular: Normal rate, regular rhythm, normal heart sounds and intact distal pulses.   Pulmonary/Chest: Effort normal and breath sounds normal.  Abdominal: Soft. He exhibits no distension.  Musculoskeletal: He exhibits no edema.  Lymphadenopathy:    He has no cervical adenopathy.  Neurological: He is alert and oriented to person, place, and time.  Skin: Skin is warm and dry.    ED Course  LUMBAR PUNCTURE Date/Time: 08/06/2013 8:03 PM Performed by: Pricilla Loveless T Authorized by: Pricilla Loveless T Consent: Verbal consent obtained. written consent obtained. Risks and benefits: risks, benefits and alternatives were discussed Consent given by: patient and parent Time out: Immediately prior to procedure a "time out" was called to verify the correct patient, procedure, equipment, support staff and site/side marked as required. Indications: evaluation for infection Anesthesia: local infiltration Local anesthetic: lidocaine 1% without epinephrine Patient sedated: no Preparation: Patient was prepped and draped in the usual sterile fashion. Lumbar space: L4-L5 interspace Patient's position: sitting Number of attempts: 1 Fluid appearance: clear Tubes of fluid: 4 Post-procedure: site cleaned and adhesive bandage applied Patient tolerance: Patient tolerated the procedure well with no immediate complications.   (including critical care time) Labs Review Labs Reviewed  CBC WITH DIFFERENTIAL - Abnormal; Notable for the following:    WBC 24.6 (*)    HCT 38.5 (*)     Neutrophils Relative % 83 (*)    Neutro Abs 20.4 (*)    Lymphocytes Relative 4 (*)    Monocytes Relative 13 (*)    Monocytes Absolute 3.2 (*)    All other components within normal limits  BASIC METABOLIC PANEL - Abnormal; Notable for the following:    Sodium 134 (*)    Potassium 3.2 (*)    Glucose, Bld 157 (*)    All other components within normal limits  URINALYSIS, ROUTINE W REFLEX MICROSCOPIC - Abnormal; Notable for the following:    APPearance CLOUDY (*)    Specific Gravity, Urine 1.004 (*)    Hgb urine dipstick SMALL (*)    Ketones, ur 40 (*)    Leukocytes, UA LARGE (*)    All other components within normal  limits  URINE MICROSCOPIC-ADD ON - Abnormal; Notable for the following:    Bacteria, UA FEW (*)    All other components within normal limits  GLUCOSE, CSF - Abnormal; Notable for the following:    Glucose, CSF 87 (*)    All other components within normal limits  CSF CULTURE  URINE CULTURE  CULTURE, BLOOD (ROUTINE X 2)  CULTURE, BLOOD (ROUTINE X 2)  HEPATIC FUNCTION PANEL  CSF CELL COUNT WITH DIFFERENTIAL  CSF CELL COUNT WITH DIFFERENTIAL  PROTEIN, CSF  CG4 I-STAT (LACTIC ACID)   Imaging Review Dg Chest 2 View  08/06/2013   *RADIOLOGY REPORT*  Clinical Data: Left-sided chest pain, shortness of breath, fever  CHEST - 2 VIEW  Comparison: 09/07/2012; 02/24/2011  Findings:  Grossly unchanged cardiac silhouette and mediastinal contours. The lungs remain hyperexpanded.  No focal parenchymal opacities.  No pleural effusion or pneumothorax.  No evidence of edema.  Grossly unchanged bones including lower cervical paraspinal fusion, incompletely evaluated.  IMPRESSION: Hyperexpanded lungs without acute cardiopulmonary disease.   Original Report Authenticated By: Tacey Ruiz, MD    MDM   1. Fever   2. Pyelonephritis    Due to having intermittent neck pain and headache, an LP was performed. No signs of meningitis on exam or LP. There is 1 WBC on tube 1, but this is not c/w  meningitis.  His mental status has remained in normal here. He is high risk for a UTI given that he self caths and intermittently has a Foley. I recommended to him that he stop having a Foley this time. Will treat for pyelo. He is a high white count but otherwise appears well. He and his dad want to go home and try outpatient therapy as he has been able to take by mouth and otherwise appears well. Based on prior urine cultures that are both sensitive to Bactrim we will treat with Bactrim for 14 days. Advised very close follow up with his PCP.    Audree Camel, MD 08/07/13 [phone]

## 2013-08-07 MED ORDER — SULFAMETHOXAZOLE-TRIMETHOPRIM 800-160 MG PO TABS: 1.0000 | ORAL_TABLET | Freq: Two times a day (BID) | ORAL | Status: DC

## 2013-08-07 NOTE — ED Notes (Signed)
rec'd call from [facility] lab for gram stain results, MD made aware.

## 2013-08-08 LAB — URINE CULTURE: Colony Count: 80000

## 2013-08-09 NOTE — ED Notes (Signed)
Post ED Visit - Positive Culture Follow-up: Successful Patient Follow-Up  Culture assessed and recommendations reviewed by: []  Wes Dulaney, Pharm.D., BCPS [x]  Celedonio Miyamoto, [address].D., BCPS []  Georgina Pillion, Pharm.D., BCPS []  [place], [place].D., BCPS, AAHIVP []  Estella Husk, Pharm.D., BCPS, AAHIVP  Positive Urine culture  []  Patient discharged without antimicrobial prescription and treatment is now indicated [x]  Organism is resistant to prescribed ED discharge antimicrobial []  Patient with positive blood cultures  Changes discussed with ED provider: Antony Madura  New antibiotic prescription stop Bactrim-start ciprofloxacin 500 mg po BID x 14 days  Larena Sox 08/09/2013, 12:33 PM

## 2013-08-09 NOTE — Progress Notes (Signed)
ED Antimicrobial Stewardship Positive Culture Follow Up   Curtis Mann is an 27 y.o. male who presented to [facility] on 08/06/2013 with a chief complaint of  Chief Complaint  Patient presents with  . Fever    Recent Results (from the past 720 hour(s))  URINE CULTURE     Status: None   Collection Time    08/06/13  6:35 PM      Result Value Range Status   Specimen Description URINE, CATHETERIZED   Final   Special Requests NONE   Final   Culture  Setup Time     Final   Value: 08/07/2013 04:41     Performed at [facility]   Colony Count     Final   Value: 80,000 COLONIES/ML     Performed at [facility]   Culture     Final   Value: PSEUDOMONAS AERUGINOSA     Performed at [facility]   Report Status 08/08/2013 FINAL   Final   Organism ID, Bacteria PSEUDOMONAS AERUGINOSA   Final  CSF CULTURE     Status: None   Collection Time    08/06/13  8:06 PM      Result Value Range Status   Specimen Description CSF   Final   Special Requests NONE   Final   Gram Stain     Final   Value: WBC PRESENT,BOTH PMN AND MONONUCLEAR     NO ORGANISMS SEEN     Performed at [facility]   Culture     Final   Value: NO GROWTH 3 DAYS     Note: Gram Stain Report Called to,Read Back By and Verified With: ALICIA BAYSE ON 08/07/2013 AT 12:15A BY WILEJ     Performed at [facility]   Report Status PENDING   Incomplete  CULTURE, BLOOD (ROUTINE X 2)     Status: None   Collection Time    08/06/13 10:20 PM      Result Value Range Status   Specimen Description BLOOD FOREARM RIGHT   Final   Special Requests NONE BOTTLES DRAWN AEROBIC AND ANAEROBIC 5CC EACH   Final   Culture  Setup Time     Final   Value: 08/07/2013 03:54     Performed at [facility]   Culture     Final   Value:        BLOOD CULTURE RECEIVED NO GROWTH TO DATE CULTURE WILL BE HELD FOR 5 DAYS BEFORE ISSUING A FINAL NEGATIVE REPORT     Performed at [facility]   Report Status PENDING    Incomplete    [x]  Treated with Bactrim, organism resistant to prescribed antimicrobial []  Patient discharged originally without antimicrobial agent and treatment is now indicated  New antibiotic prescription: ciprofloxacin 500 mg PO BID x 14 days  ED Provider: Antony Madura, PAC   Mickeal Skinner 08/09/2013, 10:26 AM Infectious Diseases Pharmacist Phone# [phone]

## 2013-08-10 ENCOUNTER — Emergency Department (HOSPITAL_COMMUNITY): Payer: Medicare Other

## 2013-08-10 ENCOUNTER — Emergency Department (HOSPITAL_COMMUNITY)
Admission: EM | Admit: 2013-08-10 | Discharge: 2013-08-11 | Disposition: A | Discharge status: Home / Self Care | Payer: Medicare Other | Attending: Emergency Medicine | Admitting: Emergency Medicine

## 2013-08-10 ENCOUNTER — Encounter (HOSPITAL_COMMUNITY): Payer: Self-pay | Admitting: Emergency Medicine

## 2013-08-10 DIAGNOSIS — R21 Rash and other nonspecific skin eruption: Secondary | ICD-10-CM | POA: Insufficient documentation

## 2013-08-10 DIAGNOSIS — Z79899 Other long term (current) drug therapy: Secondary | ICD-10-CM | POA: Insufficient documentation

## 2013-08-10 DIAGNOSIS — Z7982 Long term (current) use of aspirin: Secondary | ICD-10-CM | POA: Insufficient documentation

## 2013-08-10 DIAGNOSIS — Z8669 Personal history of other diseases of the nervous system and sense organs: Secondary | ICD-10-CM | POA: Insufficient documentation

## 2013-08-10 DIAGNOSIS — R519 Headache, unspecified: Secondary | ICD-10-CM

## 2013-08-10 DIAGNOSIS — Z8719 Personal history of other diseases of the digestive system: Secondary | ICD-10-CM | POA: Insufficient documentation

## 2013-08-10 DIAGNOSIS — Z87891 Personal history of nicotine dependence: Secondary | ICD-10-CM | POA: Insufficient documentation

## 2013-08-10 DIAGNOSIS — R231 Pallor: Secondary | ICD-10-CM | POA: Insufficient documentation

## 2013-08-10 DIAGNOSIS — R51 Headache: Secondary | ICD-10-CM | POA: Insufficient documentation

## 2013-08-10 DIAGNOSIS — R509 Fever, unspecified: Secondary | ICD-10-CM | POA: Insufficient documentation

## 2013-08-10 DIAGNOSIS — R110 Nausea: Secondary | ICD-10-CM | POA: Insufficient documentation

## 2013-08-10 DIAGNOSIS — Z87448 Personal history of other diseases of urinary system: Secondary | ICD-10-CM | POA: Insufficient documentation

## 2013-08-10 LAB — CBC WITH DIFFERENTIAL/PLATELET
Basophils Absolute: 0 10*3/uL (ref 0.0–0.1)
Lymphs Abs: 1.3 10*3/uL (ref 0.7–4.0)
MCV: 85 fL (ref 78.0–100.0)
Monocytes Relative: 17 % — ABNORMAL HIGH (ref 3–12)
Platelets: 231 10*3/uL (ref 150–400)
RDW: 13.1 % (ref 11.5–15.5)
WBC: 5.1 10*3/uL (ref 4.0–10.5)

## 2013-08-10 LAB — URINALYSIS, ROUTINE W REFLEX MICROSCOPIC
Bilirubin Urine: NEGATIVE
Ketones, ur: 40 mg/dL — AB
Nitrite: NEGATIVE
Specific Gravity, Urine: 1.014 (ref 1.005–1.030)
Urobilinogen, UA: 0.2 mg/dL (ref 0.0–1.0)

## 2013-08-10 LAB — CSF CULTURE W GRAM STAIN: Culture: NO GROWTH

## 2013-08-10 MED ORDER — SODIUM CHLORIDE 0.9 % IV BOLUS (SEPSIS)
1000.0000 mL | Freq: Once | INTRAVENOUS | Status: AC
  Administered 2013-08-10: 1000 mL via INTRAVENOUS

## 2013-08-10 MED ORDER — METOCLOPRAMIDE HCL 5 MG/ML IJ SOLN
10.0000 mg | Freq: Once | INTRAMUSCULAR | Status: AC
  Administered 2013-08-10: 10 mg via INTRAVENOUS
  Filled 2013-08-10: qty 2

## 2013-08-10 MED ORDER — SODIUM CHLORIDE 0.9 % IV SOLN
Freq: Once | INTRAVENOUS | Status: AC
  Administered 2013-08-10: 22:00:00 via INTRAVENOUS

## 2013-08-10 MED ORDER — DIPHENHYDRAMINE HCL 50 MG/ML IJ SOLN
25.0000 mg | Freq: Once | INTRAMUSCULAR | Status: AC
  Administered 2013-08-10: 25 mg via INTRAVENOUS
  Filled 2013-08-10: qty 1

## 2013-08-10 MED ORDER — KETOROLAC TROMETHAMINE 30 MG/ML IJ SOLN
30.0000 mg | Freq: Once | INTRAMUSCULAR | Status: AC
  Administered 2013-08-10: 30 mg via INTRAVENOUS
  Filled 2013-08-10: qty 1

## 2013-08-10 MED ORDER — MORPHINE SULFATE 4 MG/ML IJ SOLN
4.0000 mg | Freq: Once | INTRAMUSCULAR | Status: AC
  Administered 2013-08-10: 4 mg via INTRAVENOUS
  Filled 2013-08-10: qty 1

## 2013-08-10 NOTE — ED Notes (Signed)
Pt notified that urine is needed and urinal placed at bedside

## 2013-08-10 NOTE — ED Provider Notes (Signed)
CSN: [id number]     Arrival date & time 08/10/13  2038 History   First MD Initiated Contact with Patient 08/10/13 2105     Chief Complaint  Patient presents with  . Headache   (Consider location/radiation/quality/duration/timing/severity/associated sxs/prior Treatment) HPI Comments: Patient who is as C4-5 [place] has had fever and headache since Monday. Had full evaluation on Tuesday including LP, blood cultures, urine with the final dx Pylonephritis and DC home on Septra.  Has had persistent HA and fevers since States when he sits straight up the pain increases, denies photophobia.  Has been taking the antibiotic as prescribed.  Has tried Norco for pain without relief  Also developed rash on L shoulder and R hip   Patient is a 27 y.o. male presenting with headaches. The history is provided by the patient.  Headache Pain location:  Frontal Quality:  Sharp Radiates to:  Does not radiate Severity currently:  5/10 Severity at highest:  10/10 Onset quality:  Gradual Duration:  6 days Timing:  Intermittent Progression:  Unchanged Chronicity:  New Similar to prior headaches: no   Context: activity   Relieved by:  Nothing Exacerbated by: sitting upright. Ineffective treatments:  Prescription medications Associated symptoms: fever and nausea   Associated symptoms: no dizziness   Nausea:    Severity:  Mild   Progression:  Unchanged Risk factors comment:  Paraplegia   Past Medical History  Diagnosis Date  . Quadriplegic spinal paralysis 2009  . Hypotension occasional  . Neurogenic bladder     foley and i and o caths  . Neurogenic bowel     bowel program with digital stimulation as needed   Past Surgical History  Procedure Laterality Date  . Surgery for spinal cord injury c 4 to c 6  2009  . Bladder botox surgery  2012  . Bicep and tricept tendon transfers bilateral  2011  . Cystoscopy with injection  10/11/2012    Procedure: CYSTOSCOPY WITH INJECTION;  Surgeon: Kathi Ludwig, MD;  Location: [facility];  Service: Urology;  Laterality: N/A;  botox   History reviewed. No pertinent family history. History  Substance Use Topics  . Smoking status: Former Smoker -- 0.25 packs/day for 4 years    Types: Cigarettes    Quit date: 12/13/2007  . Smokeless tobacco: Never Used  . Alcohol Use: No    Review of Systems  Constitutional: Positive for fever. Negative for chills.  Gastrointestinal: Positive for nausea.  Skin: Positive for pallor and rash.  Neurological: Positive for headaches. Negative for dizziness.  All other systems reviewed and are negative.    Allergies  Review of patient's allergies indicates no known allergies.  Home Medications   Current Outpatient Rx  Name  Route  Sig  Dispense  Refill  . aspirin 325 MG tablet   Oral   Take 325 mg by mouth daily.         . baclofen (LIORESAL) 20 MG tablet   Oral   Take 20 mg by mouth 3 (three) times daily.         Marland Kitchen HYDROcodone-acetaminophen (NORCO/VICODIN) 5-325 MG per tablet   Oral   Take 1 tablet by mouth every 6 (six) hours as needed. Pain         . HYDROcodone-acetaminophen (NORCO/VICODIN) 5-325 MG per tablet   Oral   Take 1 tablet by mouth every 6 (six) hours as needed.   30 tablet   0   . methenamine-sodium biphosphonate (UROQUID) 500-500 MG per  tablet   Oral   Take 1 tablet by mouth daily.         Marland Kitchen oxybutynin (DITROPAN XL) 15 MG 24 hr tablet   Oral   Take 2 tablets (30 mg total) by mouth every morning.   30 tablet   6   . sulfamethoxazole-trimethoprim (BACTRIM DS,SEPTRA DS) 800-160 MG per tablet   Oral   Take 1 tablet by mouth 2 (two) times daily. One po bid x 14 days   28 tablet   0    BP 95/46  Pulse 85  Temp(Src) 97.8 F (36.6 C) (Oral)  Resp 14  SpO2 96% Physical Exam  Vitals reviewed. Constitutional: He is oriented to person, place, and time. He appears well-developed and well-nourished.  HENT:  Head: Normocephalic.  Right Ear: External ear normal.   Left Ear: External ear normal.  Mouth/Throat: Oropharynx is clear and moist.  Eyes: Pupils are equal, round, and reactive to light.  Neck: Normal range of motion.  Cardiovascular: Normal rate and regular rhythm.   Pulmonary/Chest: Effort normal and breath sounds normal.  Abdominal: Soft.  Resolving rash on anterior abdomen   Musculoskeletal:  Spastic movement upper extremities paralysis of lower extremities   Neurological: He is alert and oriented to person, place, and time.  Skin: Rash noted.    ED Course  Procedures (including critical care time) Labs Review Labs Reviewed  CBC WITH DIFFERENTIAL - Abnormal; Notable for the following:    Monocytes Relative 17 (*)    All other components within normal limits  URINALYSIS, ROUTINE W REFLEX MICROSCOPIC - Abnormal; Notable for the following:    Ketones, ur 40 (*)    Leukocytes, UA TRACE (*)    All other components within normal limits  URINE MICROSCOPIC-ADD ON  ROCKY MTN SPOTTED FVR AB, IGG-BLOOD  B. BURGDORFI ANTIBODIES BY WB   Imaging Review Ct Head Wo Contrast  08/10/2013   CLINICAL DATA:  Headache.  EXAM: CT HEAD WITHOUT CONTRAST  TECHNIQUE: Contiguous axial images were obtained from the base of the skull through the vertex without intravenous contrast.  COMPARISON:  04/17/2005  FINDINGS: No acute intracranial abnormality. Specifically, no hemorrhage, hydrocephalus, mass lesion, acute infarction, or significant intracranial injury. No acute calvarial abnormality. Visualized paranasal sinuses and mastoids clear. Orbital soft tissues unremarkable.  IMPRESSION: Negative.   Electronically Signed   By: Charlett Nose   On: 08/10/2013 22:59    MDM   1. Headache     Headache resolved after IV fluids headache cocktail and IV morphine Think this is post lumbar tap headache  Encouraged to drink plenty of fluids and eat salty foods for the next several days To return if he again developes headaches     Arman Filter, NP 08/11/13  0104

## 2013-08-10 NOTE — ED Notes (Signed)
Pt states on Monday he woke with a fever of 101.6 and Tuesday his fever went up to 103  Pt states [facility] and [facility] test and a lumbar puncture   Pt was sent home on an antibiotic  Pt states since everytime he tries to sit up straight he has "excruciating pain"   Pt has a red area on his left shoulder and another place on his right hip  Pt states the one on his shoulder came up on Thursday

## 2013-08-11 LAB — POCT I-STAT, CHEM 8
BUN: 5 mg/dL — ABNORMAL LOW (ref 6–23)
Chloride: 103 mEq/L (ref 96–112)
Creatinine, Ser: 0.7 mg/dL (ref 0.50–1.35)
Potassium: 3.9 mEq/L (ref 3.5–5.1)
Sodium: 140 mEq/L (ref 135–145)

## 2013-08-13 LAB — CULTURE, BLOOD (ROUTINE X 2): Culture: NO GROWTH

## 2013-08-13 NOTE — ED Provider Notes (Signed)
Medical screening examination/treatment/procedure(s) were performed by non-physician practitioner and as supervising physician I was immediately available for consultation/collaboration.    Jamie Hafford L Rebeca Valdivia, MD 08/13/13 0719 

## 2013-12-27 ENCOUNTER — Other Ambulatory Visit: Payer: Self-pay | Admitting: Urology

## 2014-01-14 ENCOUNTER — Encounter (HOSPITAL_COMMUNITY): Payer: Self-pay | Admitting: Pharmacy Technician

## 2014-01-14 ENCOUNTER — Encounter (HOSPITAL_COMMUNITY): Payer: Self-pay | Admitting: *Deleted

## 2014-01-15 ENCOUNTER — Other Ambulatory Visit: Payer: Self-pay | Admitting: Urology

## 2014-01-16 ENCOUNTER — Other Ambulatory Visit: Payer: Self-pay | Admitting: Urology

## 2014-01-20 ENCOUNTER — Encounter (HOSPITAL_COMMUNITY): Payer: Self-pay | Admitting: *Deleted

## 2014-01-20 ENCOUNTER — Encounter (HOSPITAL_COMMUNITY)
Admission: RE | Disposition: A | Discharge status: Home / Self Care | Payer: Self-pay | Source: Ambulatory Visit | Attending: Urology

## 2014-01-20 ENCOUNTER — Ambulatory Visit (HOSPITAL_COMMUNITY)
Admission: RE | Admit: 2014-01-20 | Discharge: 2014-01-20 | Disposition: A | Discharge status: Home / Self Care | Payer: Medicare Other | Source: Ambulatory Visit | Attending: Urology | Admitting: Urology

## 2014-01-20 ENCOUNTER — Ambulatory Visit (HOSPITAL_COMMUNITY): Payer: Medicare Other | Admitting: Registered Nurse

## 2014-01-20 ENCOUNTER — Encounter (HOSPITAL_COMMUNITY): Payer: Medicare Other | Admitting: Registered Nurse

## 2014-01-20 DIAGNOSIS — N3020 Other chronic cystitis without hematuria: Secondary | ICD-10-CM | POA: Insufficient documentation

## 2014-01-20 DIAGNOSIS — G904 Autonomic dysreflexia: Secondary | ICD-10-CM | POA: Insufficient documentation

## 2014-01-20 DIAGNOSIS — Z87891 Personal history of nicotine dependence: Secondary | ICD-10-CM | POA: Insufficient documentation

## 2014-01-20 DIAGNOSIS — N319 Neuromuscular dysfunction of bladder, unspecified: Secondary | ICD-10-CM | POA: Insufficient documentation

## 2014-01-20 DIAGNOSIS — Z981 Arthrodesis status: Secondary | ICD-10-CM | POA: Insufficient documentation

## 2014-01-20 DIAGNOSIS — G8254 Quadriplegia, C5-C7 incomplete: Secondary | ICD-10-CM | POA: Insufficient documentation

## 2014-01-20 DIAGNOSIS — N3289 Other specified disorders of bladder: Secondary | ICD-10-CM | POA: Insufficient documentation

## 2014-01-20 DIAGNOSIS — Z79899 Other long term (current) drug therapy: Secondary | ICD-10-CM | POA: Insufficient documentation

## 2014-01-20 DIAGNOSIS — N39498 Other specified urinary incontinence: Secondary | ICD-10-CM | POA: Insufficient documentation

## 2014-01-20 DIAGNOSIS — IMO0002 Reserved for concepts with insufficient information to code with codable children: Secondary | ICD-10-CM | POA: Insufficient documentation

## 2014-01-20 DIAGNOSIS — N3941 Urge incontinence: Secondary | ICD-10-CM | POA: Insufficient documentation

## 2014-01-20 HISTORY — PX: CYSTOSCOPY: SHX5120

## 2014-01-20 SURGERY — CYSTOSCOPY
Anesthesia: General

## 2014-01-20 MED ORDER — LEVOFLOXACIN 500 MG PO TABS: 500.0000 mg | ORAL_TABLET | Freq: Every day | ORAL | Status: DC

## 2014-01-20 MED ORDER — ONDANSETRON HCL 4 MG/2ML IJ SOLN
INTRAMUSCULAR | Status: AC
  Filled 2014-01-20: qty 2

## 2014-01-20 MED ORDER — PROMETHAZINE HCL 25 MG/ML IJ SOLN: 6.2500 mg | INTRAMUSCULAR | Status: DC | PRN

## 2014-01-20 MED ORDER — PROPOFOL 10 MG/ML IV BOLUS
INTRAVENOUS | Status: DC | PRN
  Administered 2014-01-20: 150 mg via INTRAVENOUS

## 2014-01-20 MED ORDER — LACTATED RINGERS IV SOLN
INTRAVENOUS | Status: DC
Start: 2014-01-20 — End: 2014-01-20

## 2014-01-20 MED ORDER — MIDAZOLAM HCL 5 MG/5ML IJ SOLN
INTRAMUSCULAR | Status: DC | PRN
  Administered 2014-01-20 (×2): 1 mg via INTRAVENOUS

## 2014-01-20 MED ORDER — GLYCOPYRROLATE 0.2 MG/ML IJ SOLN
INTRAMUSCULAR | Status: DC | PRN
  Administered 2014-01-20: 0.2 mg via INTRAVENOUS

## 2014-01-20 MED ORDER — LIDOCAINE HCL (CARDIAC) 20 MG/ML IV SOLN
INTRAVENOUS | Status: AC
  Filled 2014-01-20: qty 5

## 2014-01-20 MED ORDER — ONABOTULINUMTOXINA 100 UNITS IJ SOLR
200.0000 [IU] | Freq: Once | INTRAMUSCULAR | Status: AC
  Administered 2014-01-20: 200 [IU] via INTRAMUSCULAR
  Filled 2014-01-20: qty 200

## 2014-01-20 MED ORDER — GENTAMICIN SULFATE 40 MG/ML IJ SOLN
260.0000 mg | Freq: Once | INTRAVENOUS | Status: AC
  Administered 2014-01-20: 260 mg via INTRAVENOUS
  Filled 2014-01-20: qty 6.5

## 2014-01-20 MED ORDER — MIDAZOLAM HCL 2 MG/2ML IJ SOLN
INTRAMUSCULAR | Status: AC
  Filled 2014-01-20: qty 2

## 2014-01-20 MED ORDER — GLYCOPYRROLATE 0.2 MG/ML IJ SOLN
INTRAMUSCULAR | Status: AC
  Filled 2014-01-20: qty 1

## 2014-01-20 MED ORDER — GENTAMICIN SULFATE 40 MG/ML IJ SOLN: 120.0000 mg | INTRAVENOUS | Status: DC

## 2014-01-20 MED ORDER — ONDANSETRON HCL 4 MG/2ML IJ SOLN
INTRAMUSCULAR | Status: DC | PRN
  Administered 2014-01-20: 4 mg via INTRAVENOUS

## 2014-01-20 MED ORDER — PROPOFOL 10 MG/ML IV BOLUS
INTRAVENOUS | Status: AC
  Filled 2014-01-20: qty 20

## 2014-01-20 MED ORDER — LIDOCAINE HCL (CARDIAC) 20 MG/ML IV SOLN
INTRAVENOUS | Status: DC | PRN
  Administered 2014-01-20: 80 mg via INTRAVENOUS

## 2014-01-20 MED ORDER — FENTANYL CITRATE 0.05 MG/ML IJ SOLN
INTRAMUSCULAR | Status: DC | PRN
  Administered 2014-01-20: 50 ug via INTRAVENOUS

## 2014-01-20 MED ORDER — SODIUM CHLORIDE 0.9 % IJ SOLN
INTRAMUSCULAR | Status: AC
  Filled 2014-01-20: qty 40

## 2014-01-20 MED ORDER — FENTANYL CITRATE 0.05 MG/ML IJ SOLN
INTRAMUSCULAR | Status: AC
  Filled 2014-01-20: qty 2

## 2014-01-20 MED ORDER — LACTATED RINGERS IV SOLN
INTRAVENOUS | Status: DC | PRN
  Administered 2014-01-20: 12:00:00 via INTRAVENOUS

## 2014-01-20 MED ORDER — SODIUM CHLORIDE 0.9 % IR SOLN
Status: DC | PRN
Start: 2014-01-20 — End: 2014-01-20
  Administered 2014-01-20: 2000 mL via INTRAVESICAL

## 2014-01-20 SURGICAL SUPPLY — 12 items
BAG URO CATCHER STRL LF (DRAPE) ×2 IMPLANT
CATH INTERMIT  6FR 70CM (CATHETERS) ×1 IMPLANT
CLOTH BEACON ORANGE TIMEOUT ST (SAFETY) ×2 IMPLANT
DRAPE CAMERA CLOSED 9X96 (DRAPES) ×2 IMPLANT
GLOVE BIOGEL M STRL SZ7.5 (GLOVE) ×2 IMPLANT
GOWN STRL REUS W/TWL XL LVL3 (GOWN DISPOSABLE) ×2 IMPLANT
GUIDEWIRE STR DUAL SENSOR (WIRE) ×1 IMPLANT
MANIFOLD NEPTUNE II (INSTRUMENTS) ×2 IMPLANT
NS IRRIG 1000ML POUR BTL (IV SOLUTION) ×1 IMPLANT
PACK CYSTO (CUSTOM PROCEDURE TRAY) ×2 IMPLANT
SCRUB PCMX 4 OZ (MISCELLANEOUS) ×1 IMPLANT
TUBING CONNECTING 10 (TUBING) ×2 IMPLANT

## 2014-01-20 NOTE — Op Note (Signed)
Pre-operative diagnosis :   Neurogenic Bladder with urinary incontinence  Postoperative diagnosis:  Same  Operation: Cystoscopy, Botox bladder injection ( 200 units).   Surgeon:  Curtis RhodesS. Patsi Searsannenbaum, MD  First assistant:  none  Anesthesia:  Gen LMA  Preparation:  After appropriate preanesthesia, the patient was brought the operating room, placed in the upper table in the dorsal supine position where general LMA anesthesia was introduced. Using replaced in the dorsal lithotomy position with pubis was prepped with Betadine solution draped in usual fashion. The patient's temperature remained normal. No evidence of autonomic dysreflexia.  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 Chronic cystitis (595.2)  3. Urinary incontinence (788.30)   Assessed By: Jethro Bolusannenbaum, Diamante Rubin (Urology); Last Assessed: 13 Jun 2012  History of Present Illness  [name] is a 28 year old gentleman returns today having bladder spasms & to discuss possibly scheduling Botox surgery. Hx of a spinal cord injury at C5-C6 in 2009. He catheterized 3-4 times a day and generally is dry. He is on oxybutynin ER 30 mg a day with 2 tablets taken simultaneously. His renal ultrasound 2013 was normal. His last Botox 200 units was on 10/11/12.      Statement of  Likelihood of Success: Excellent. TIME-OUT observed.:  Procedure:  The injection scope was placed transurethrally without difficulty. Cystoscopy revealed a neurogenic bladder with trabeculation. Trigone was easily identified with ureteral orifices in normal position. The patient then underwent 200 units of Botox submucosal injection in 21 cc injections without difficulty. There was no bleeding noted. The bladder is drained of fluid. The patient tolerated the procedure well. He was awakened and taken to recovery room in good condition.

## 2014-01-20 NOTE — Anesthesia Preprocedure Evaluation (Addendum)
Anesthesia Evaluation  Patient identified by MRN, date of birth, ID band Patient awake    Reviewed: Allergy & Precautions, H&P , NPO status , Patient's Chart, lab work & pertinent test results  History of Anesthesia Complications (+) AWARENESS UNDER ANESTHESIA  Airway Mallampati: II TM Distance: >3 FB Neck ROM: Full    Dental  (+) Teeth Intact and Dental Advisory Given   Pulmonary neg pulmonary ROS, former smoker,  Prior tracheostomy breath sounds clear to auscultation  Pulmonary exam normal       Cardiovascular negative cardio ROS  Rhythm:Regular Rate:Normal  Autonomic hyper-reflexia   Neuro/Psych Spinal paralysis C4-6 ; quadriplegic 2009 negative psych ROS   GI/Hepatic negative GI ROS, Neg liver ROS,   Endo/Other  negative endocrine ROS  Renal/GU negative Renal ROS  negative genitourinary   Musculoskeletal negative musculoskeletal ROS (+)   Abdominal   Peds  Hematology negative hematology ROS (+)   Anesthesia Other Findings   Reproductive/Obstetrics negative OB ROS                          Anesthesia Physical Anesthesia Plan  ASA: III  Anesthesia Plan: General   Post-op Pain Management:    Induction: Intravenous  Airway Management Planned: LMA  Additional Equipment:   Intra-op Plan:   Post-operative Plan: Extubation in OR  Informed Consent: I have reviewed the patients History and Physical, chart, labs and discussed the procedure including the risks, benefits and alternatives for the proposed anesthesia with the patient or authorized representative who has indicated his/her understanding and acceptance.   Dental advisory given  Plan Discussed with: CRNA  Anesthesia Plan Comments:         Anesthesia Quick Evaluation

## 2014-01-20 NOTE — Preoperative (Signed)
Beta Blockers   Reason not to administer Beta Blockers:Not Applicable 

## 2014-01-20 NOTE — H&P (Signed)
ms  1. 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 Chronic cystitis (595.2) 3. Urinary incontinence (788.30)   Assessed By: Jethro Bolus (Urology); Last Assessed: 13 Jun 2012  History of Present Illness [name] is a 28 year old gentleman returns today having bladder spasms & to discuss possibly scheduling Botox surgery. Hx of a spinal cord injury at C5-C6 in 2009. He catheterized 3-4 times a day and generally is dry. He is on oxybutynin ER 30 mg a day with 2 tablets taken simultaneously. His renal ultrasound 2013 was normal. His last Botox 200 units was on 10/11/12.     He says he gets approximately 2 infections a year treated with antibiotics. Other times he may have a low-grade infection with spasticity, but he drinks a lot of fluids and it clears. He says he is having some breakthrough incontinence and he will put a Foley in for a week and then go back to clean intermittent catheterization and things normalize for a few weeks.    He is on prophylactic antibiotics. He failed Toviaz and Myrbetriq.     There is no other modifying factors or associated signs or symptoms. There is no other aggravating or relieving factors. The symptoms are minimal, mild in severity and persistent.   Past Medical History Problems  1. History of No Medical Problems  Surgical History Problems  1. History of Cervical Vertebral Fusion 2. History of Cystoscopy (Diagnostic) 3. History of Urologic Surgery  Current Meds 1. Baclofen 20 MG Oral Tablet;  Therapy: (Recorded:24Aug2011) to Recorded 2. Cranberry Concentrate CAPS;  Therapy: (Recorded:26Mar2012) to Recorded 3. Hydrocodone-Acetaminophen 5-325 MG Oral Tablet;  Therapy: 14Apr2013 to Recorded 4. Multi-Vitamin TABS;  Therapy: (Recorded:26Mar2012) to Recorded 5. Nitrofurantoin Macrocrystal 100 MG Oral Capsule; TAKE 1 CAPSULE Daily;  Therapy: 18Feb2014 to (Evaluate:13Feb2015)   Requested for: 18Feb2014; Last  Rx:18Feb2014 Ordered 6. Oxybutynin Chloride ER 15 MG Oral Tablet Extended Release 24 Hour; TAKE 2 TABLETS  BY MOUTH EVERY DAY;  Therapy: 09Jun2010 to (Evaluate:07Jun2015)  Requested for: 13Jun2014; Last  Rx:12Jun2014 Ordered  Allergies Medication  1. No Known Drug Allergies  Family History Problems  1. Family history of Colon Cancer (V16.0) : Mother 2. Family history of Family Health Status - Father's Age   52 3. Family history of Mother Deceased At Age ____   41, colon cancer 4. Family history of Prostate Cancer (V16.42)  Social History Problems  1. Denied: History of Alcohol Use 2. Caffeine Use   1 per day 3. Former Smoker   smoked x 3 yrs, quit 1 yr ago 4. Marital History - Single 5. Occupation:   disabled  Review of Systems Genitourinary, constitutional, skin, eye, otolaryngeal, hematologic/lymphatic, cardiovascular, pulmonary, endocrine, musculoskeletal, gastrointestinal, neurological and psychiatric system(s) were reviewed and pertinent findings if present are noted.  Genitourinary: incontinence and bladder spasms.    Vitals Vital Signs [Data Includes: Last 1 Day]  Recorded: 14Jan2015 03:38PM  Blood Pressure: 82 / 59 Temperature: 98.1 F Heart Rate: 81  Physical Exam Constitutional: Well nourished and well developed . No acute distress. The patient appears well hydrated. Wheelchair-bound.  ENT:. The ears and nose are normal in appearance.  Neck: The appearance of the neck is normal.  Pulmonary: No respiratory distress.  Cardiovascular:. No peripheral edema.  Abdomen: Incision site(s) well healed. The abdomen is soft and nontender.  Genitourinary: Examination of the penis demonstrates no discharge, no masses, no lesions and a normal meatus. The scrotum is without lesions. The  right epididymis is palpably normal and non-tender. The left epididymis is palpably normal and non-tender. The right testis is non-tender and without  masses. The left testis is non-tender and without masses.  Lymphatics: The femoral and inguinal nodes are not enlarged or tender.  Skin: Normal skin turgor, no visible rash and no visible skin lesions.  Neuro/Psych:. Mood and affect are appropriate. Decreased sensation of the perineum/perianal region (S3,4,5).    Assessment Assessed  1. 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 Chronic cystitis (595.2) 3. Closed fracture of C5-C7 level with spinal cord injury (806.05) 4. 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 Urinary incontinence without sensory awareness (788.34) 6. Urge incontinence of urine (788.31)  Pt needs clean u/a before another Botox injection. He currently has indwelling foley catheter. ( convenience). He will wait until he can give a clean I/o catheter specimen, and, when his c/s is clear, he will have Botox again. ( 200 units). He has a hx of klebsiella UTI, sensitive to cipro, and Gentamycin, and will take cipro 5 days prior to his Botox injection.   Plan Autonomic dysreflexia, Chronic cystitis, Closed fracture of C5-C7 level with spinal cord injury, Quadriplegia, C5-C7, incomplete, Urge incontinence of urine, Urinary incontinence without sensory awareness  1. Follow-up Schedule Surgery Office  Follow-up  Status: Hold For - Appointment   Requested for: 14Jan2015  1. I/o cath for c/s  2. schedule Botox, 200 unit bladder injection, with awareness of his autonomic dysreflexia. ( he sits up in order to let BP fall, empty bladder and empty bowel).   3. cover with cipro for prior klebsiella UTI, sensitive to cipro and gentamycin.   Signatures Electronically signed by : Jethro BolusSigmund Camila Norville, M.D.; Dec 25 2013  4:20PM EST

## 2014-01-20 NOTE — Transfer of Care (Signed)
Immediate Anesthesia Transfer of Care Note  Patient: Curtis Mann  Procedure(s) Performed: Procedure(s): CYSTOSCOPY WITH BOTOX INJECTION (N/A)  Patient Location: PACU  Anesthesia Type:General  Level of Consciousness: awake, alert , oriented and patient cooperative  Airway & Oxygen Therapy: Patient Spontanous Breathing and Patient connected to face mask oxygen  Post-op Assessment: Report given to PACU RN, Post -op Vital signs reviewed and stable and Patient moving all extremities  Post vital signs: Reviewed and stable  Complications: No apparent anesthesia complications

## 2014-01-20 NOTE — Interval H&P Note (Signed)
History and Physical Interval Note:  01/20/2014 8:45 AM  Curtis Mann  has presented today for surgery, with the diagnosis of Overactive Bladder  The various methods of treatment have been discussed with the patient and family. After consideration of risks, benefits and other options for treatment, the patient has consented to  Procedure(s): CYSTOSCOPY (N/A) as a surgical intervention .  The patient's history has been reviewed, patient examined, no change in status, stable for surgery.  I have reviewed the patient's chart and labs.  Questions were answered to the patient's satisfaction.     Jethro BolusANNENBAUM, Judythe Postema I

## 2014-01-20 NOTE — Anesthesia Postprocedure Evaluation (Signed)
Anesthesia Post Note  Patient: Curtis Mann  Procedure(s) Performed: Procedure(s) (LRB): CYSTOSCOPY WITH BOTOX INJECTION (N/A)  Anesthesia type: General  Patient location: PACU  Post pain: Pain level controlled  Post assessment: Post-op Vital signs reviewed  Last Vitals:  Filed Vitals:   01/20/14 1445  BP: 96/66  Pulse: 79  Temp: 36.4 C  Resp: 14    Post vital signs: Reviewed  Level of consciousness: sedated  Complications: No apparent anesthesia complications

## 2014-01-20 NOTE — Discharge Instructions (Signed)
I have reviewed discharge instructions in detail with the patient. They will follow-up with me or their physician as scheduled. My nurse will also be calling the patients as per protocol.   

## 2014-01-21 ENCOUNTER — Encounter (HOSPITAL_COMMUNITY): Payer: Self-pay | Admitting: Urology

## 2014-09-05 ENCOUNTER — Emergency Department (HOSPITAL_COMMUNITY)
Admission: EM | Admit: 2014-09-05 | Discharge: 2014-09-05 | Disposition: A | Discharge status: Home / Self Care | Payer: Medicare Other | Attending: Emergency Medicine | Admitting: Emergency Medicine

## 2014-09-05 ENCOUNTER — Encounter (HOSPITAL_COMMUNITY): Payer: Self-pay | Admitting: Emergency Medicine

## 2014-09-05 ENCOUNTER — Emergency Department (HOSPITAL_COMMUNITY): Payer: Medicare Other

## 2014-09-05 DIAGNOSIS — Z9889 Other specified postprocedural states: Secondary | ICD-10-CM | POA: Insufficient documentation

## 2014-09-05 DIAGNOSIS — Z8679 Personal history of other diseases of the circulatory system: Secondary | ICD-10-CM | POA: Diagnosis not present

## 2014-09-05 DIAGNOSIS — N3020 Other chronic cystitis without hematuria: Secondary | ICD-10-CM

## 2014-09-05 DIAGNOSIS — R197 Diarrhea, unspecified: Secondary | ICD-10-CM | POA: Insufficient documentation

## 2014-09-05 DIAGNOSIS — Z79899 Other long term (current) drug therapy: Secondary | ICD-10-CM | POA: Insufficient documentation

## 2014-09-05 DIAGNOSIS — Z87891 Personal history of nicotine dependence: Secondary | ICD-10-CM | POA: Insufficient documentation

## 2014-09-05 DIAGNOSIS — Z8669 Personal history of other diseases of the nervous system and sense organs: Secondary | ICD-10-CM | POA: Insufficient documentation

## 2014-09-05 DIAGNOSIS — K6289 Other specified diseases of anus and rectum: Secondary | ICD-10-CM | POA: Diagnosis not present

## 2014-09-05 DIAGNOSIS — R1032 Left lower quadrant pain: Secondary | ICD-10-CM | POA: Insufficient documentation

## 2014-09-05 LAB — COMPREHENSIVE METABOLIC PANEL
ALT: 11 U/L (ref 0–53)
AST: 14 U/L (ref 0–37)
Albumin: 4.2 g/dL (ref 3.5–5.2)
Alkaline Phosphatase: 42 U/L (ref 39–117)
Anion gap: 11 (ref 5–15)
BUN: 11 mg/dL (ref 6–23)
CO2: 26 meq/L (ref 19–32)
CREATININE: 0.47 mg/dL — AB (ref 0.50–1.35)
Calcium: 9.2 mg/dL (ref 8.4–10.5)
Chloride: 103 mEq/L (ref 96–112)
GFR calc non Af Amer: >90 mL/min (ref >90)
Glucose, Bld: 93 mg/dL (ref 70–99)
Potassium: 4.2 mEq/L (ref 3.7–5.3)
SODIUM: 140 meq/L (ref 137–147)
Total Bilirubin: 0.2 mg/dL — ABNORMAL LOW (ref 0.3–1.2)
Total Protein: 7.2 g/dL (ref 6.0–8.3)

## 2014-09-05 LAB — CBC WITH DIFFERENTIAL/PLATELET
BASOS PCT: 0 % (ref 0–1)
Basophils Absolute: 0 10*3/uL (ref 0.0–0.1)
EOS ABS: 0.3 10*3/uL (ref 0.0–0.7)
Eosinophils Relative: 3 % (ref 0–5)
HEMATOCRIT: 42.3 % (ref 39.0–52.0)
Hemoglobin: 14.5 g/dL (ref 13.0–17.0)
Lymphocytes Relative: 24 % (ref 12–46)
Lymphs Abs: 1.9 10*3/uL (ref 0.7–4.0)
MCH: 29.3 pg (ref 26.0–34.0)
MCHC: 34.3 g/dL (ref 30.0–36.0)
MCV: 85.5 fL (ref 78.0–100.0)
MONO ABS: 0.9 10*3/uL (ref 0.1–1.0)
Monocytes Relative: 11 % (ref 3–12)
Neutro Abs: 4.7 10*3/uL (ref 1.7–7.7)
Neutrophils Relative %: 62 % (ref 43–77)
Platelets: 275 10*3/uL (ref 150–400)
RBC: 4.95 MIL/uL (ref 4.22–5.81)
RDW: 13.2 % (ref 11.5–15.5)
WBC: 7.7 10*3/uL (ref 4.0–10.5)

## 2014-09-05 LAB — URINALYSIS, ROUTINE W REFLEX MICROSCOPIC
BILIRUBIN URINE: NEGATIVE
Glucose, UA: NEGATIVE mg/dL
HGB URINE DIPSTICK: NEGATIVE
KETONES UR: NEGATIVE mg/dL
Leukocytes, UA: NEGATIVE
Nitrite: NEGATIVE
PROTEIN: NEGATIVE mg/dL
Specific Gravity, Urine: 1.013 (ref 1.005–1.030)
UROBILINOGEN UA: 0.2 mg/dL (ref 0.0–1.0)
pH: 7.5 (ref 5.0–8.0)

## 2014-09-05 LAB — I-STAT CG4 LACTIC ACID, ED: Lactic Acid, Venous: 0.51 mmol/L (ref 0.5–2.2)

## 2014-09-05 MED ORDER — IOHEXOL 300 MG/ML  SOLN
80.0000 mL | Freq: Once | INTRAMUSCULAR | Status: AC | PRN
  Administered 2014-09-05: 80 mL via INTRAVENOUS

## 2014-09-05 MED ORDER — SODIUM CHLORIDE 0.9 % IV BOLUS (SEPSIS)
1000.0000 mL | INTRAVENOUS | Status: AC
  Administered 2014-09-05: 1000 mL via INTRAVENOUS

## 2014-09-05 MED ORDER — MORPHINE SULFATE 4 MG/ML IJ SOLN
4.0000 mg | Freq: Once | INTRAMUSCULAR | Status: AC
  Administered 2014-09-05: 4 mg via INTRAVENOUS
  Filled 2014-09-05: qty 1

## 2014-09-05 MED ORDER — SODIUM CHLORIDE 0.9 % IV BOLUS (SEPSIS)
1000.0000 mL | Freq: Once | INTRAVENOUS | Status: AC
  Administered 2014-09-05: 1000 mL via INTRAVENOUS

## 2014-09-05 MED ORDER — IOHEXOL 300 MG/ML  SOLN
25.0000 mL | INTRAMUSCULAR | Status: AC
  Administered 2014-09-05 (×2): 25 mL via ORAL

## 2014-09-05 NOTE — Discharge Instructions (Signed)
Return to the emergency room with worsening of symptoms, new symptoms or with symptoms that are concerning, especially fevers, severe abdominal pain, nausea, vomiting, unable to keep fluids down, if you are unable to have BM. Use stool softeners and metamucil, fiber supplement. Continue with rectal stimulation as normal to continue having BM. Follow up with gastroenterology in 2 days. Follow up with urology as soon as possible.

## 2014-09-05 NOTE — ED Notes (Signed)
Pt reports Monday evening began having left lower quadrant pain. Pt reports some diarrhea Tuesday. Pt reports temp to 101 Sunday. Pt also c/o neck pain. Pt is currently wheelchair bound due to old spinal cord injury. Pt awake, alert, oriented x4, NAD.

## 2014-09-05 NOTE — ED Notes (Signed)
CT notified that pt has completed drinking contrast.

## 2014-09-05 NOTE — ED Provider Notes (Signed)
CSN: [id number]     Arrival date & time 09/05/14  1251 History   First MD Initiated Contact with Patient 09/05/14 1338     Chief Complaint  Patient presents with  . Abdominal Pain     (Consider location/radiation/quality/duration/timing/severity/associated sxs/prior Treatment) HPI Comments: The patient is a 28 year old male with past medical history of quadriplegia, hypertension, presents emergency room chief complaint of left lower abdominal discomfort for 5 days.  The patient reports initial onset of symptoms with movement upon laying down. He reports increase in discomfort with movement.  Patient reports fever 6 days ago, resolved. He reports one episode of loose stool 4 days ago. Patient is unsure if it's musculoskeletal, abdomen due to decrease sensation. Denies recent travel.  The history is provided by the patient. No language interpreter was used.    Past Medical History  Diagnosis Date  . Quadriplegic spinal paralysis 2009  . Hypotension occasional  . Neurogenic bladder     foley and i and o caths  . Neurogenic bowel     bowel program with digital stimulation as needed   Past Surgical History  Procedure Laterality Date  . Surgery for spinal cord injury c 4 to c 6  2009  . Bladder botox surgery  2012  . Bicep and tricept tendon transfers bilateral  2011  . Cystoscopy with injection  10/11/2012    Procedure: CYSTOSCOPY WITH INJECTION;  Surgeon: Kathi Ludwig, MD;  Location: [facility];  Service: Urology;  Laterality: N/A;  botox  . Cystoscopy N/A 01/20/2014    Procedure: CYSTOSCOPY WITH BOTOX INJECTION;  Surgeon: Kathi Ludwig, MD;  Location: [facility];  Service: Urology;  Laterality: N/A;   History reviewed. No pertinent family history. History  Substance Use Topics  . Smoking status: Former Smoker -- 0.25 packs/day for 4 years    Types: Cigarettes    Quit date: 12/13/2007  . Smokeless tobacco: Never Used  . Alcohol Use: No    Review of Systems   Constitutional: Positive for fever.  Respiratory: Negative for cough.   Gastrointestinal: Positive for abdominal pain and diarrhea. Negative for constipation and blood in stool.  Musculoskeletal: Positive for back pain.      Allergies  Review of patient's allergies indicates no known allergies.  Home Medications   Prior to Admission medications   Medication Sig Start Date End Date Taking? Authorizing Provider  baclofen (LIORESAL) 20 MG tablet Take 20 mg by mouth 3 (three) times daily.    Historical Provider, MD  Cranberry 400 MG CAPS Take 1 capsule by mouth daily.    Historical Provider, MD  HYDROcodone-acetaminophen (NORCO/VICODIN) 5-325 MG per tablet Take 1 tablet by mouth every 6 (six) hours as needed. Pain    Historical Provider, MD  levofloxacin (LEVAQUIN) 500 MG tablet Take 1 tablet (500 mg total) by mouth daily. 01/20/14   Kathi Ludwig, MD  levofloxacin (LEVAQUIN) 500 MG tablet Take 1 tablet (500 mg total) by mouth daily. 01/20/14   Kathi Ludwig, MD  methenamine-sodium biphosphonate (UROQUID) 500-500 MG per tablet Take 1 tablet by mouth daily. 10/11/12   Kathi Ludwig, MD  Multiple Vitamin (MULTIVITAMIN WITH MINERALS) TABS tablet Take 1 tablet by mouth daily.    Historical Provider, MD  oxybutynin (DITROPAN XL) 15 MG 24 hr tablet Take 15 mg by mouth every morning.    Historical Provider, MD   BP 81/60  Pulse 65  Temp(Src) 98 F (36.7 C) (Oral)  Resp 18  Wt 120 lb (  54.432 kg)  SpO2 97% Physical Exam  Nursing note and vitals reviewed. Constitutional: He is oriented to person, place, and time. He appears well-developed and well-nourished. No distress.  HENT:  Head: Normocephalic and atraumatic.  Cardiovascular: Normal rate and regular rhythm.   Pulmonary/Chest: Effort normal and breath sounds normal. He has no wheezes. He has no rales.  Abdominal: Soft. There is tenderness in the suprapubic area and left lower quadrant. There is guarding.   Musculoskeletal:  No movement in lower extremities. Decrease movement in upper extremities. No tenderness with palpation of lower back.  Neurological: He is alert and oriented to person, place, and time.  Skin: Skin is warm and dry. He is not diaphoretic.    ED Course  Procedures (including critical care time) Labs Review Labs Reviewed  COMPREHENSIVE METABOLIC PANEL - Abnormal; Notable for the following:    Creatinine, Ser 0.47 (*)    Total Bilirubin 0.2 (*)    All other components within normal limits  URINE CULTURE  CBC WITH DIFFERENTIAL  URINALYSIS, ROUTINE W REFLEX MICROSCOPIC  I-STAT CG4 LACTIC ACID, ED    Imaging Review No results found.   EKG Interpretation None      MDM   Final diagnoses:  Chronic nonspecific cystitis  Proctitis   Pt is a 28 year old male with a past medical history of quadriplegia, presents with abdominal pain.  Pt has suprapubic discomfort and left sided abdominal discomfort with palpation. Pt has decrease sensation due to spinal injury, CT to rule out acute findings. Labs, pain medication ordered. Pt care assumed by Ranell Patrick, PA-C at shift change, awaiting CT results.    Mellody Drown, PA-C 09/05/14 2242

## 2014-09-05 NOTE — ED Provider Notes (Signed)
28 year old male, history of high level quadriplegia, presents with abdominal discomfort which is lower midabdomen. On exam the patient does have reproducible tenderness in this area, he reports a  fever measured at approximately 101 yesterday. No difficulty with urination, self caths, no chest pain cough or shortness of breath.  there is no tachycardia, no difficulty breathing, no peripheral edema or rashes, his urinalysis at the bedside appears clean, he will need lab testing and a CT scan to rule out intra-abdominal process such as appendicitis or diverticulitis. The patient is in agreement.   Medical screening examination/treatment/procedure(s) were conducted as a shared visit with non-physician practitioner(s) and myself.  I personally evaluated the patient during the encounter.  Clinical Impression:   Final diagnoses:  Chronic nonspecific cystitis  Proctitis         Vida Roller, MD 09/06/14 [phone]

## 2014-09-06 LAB — URINE CULTURE
Colony Count: NO GROWTH
Culture: NO GROWTH

## 2014-09-06 NOTE — ED Provider Notes (Signed)
CSN: [id number]     Arrival date & time 09/05/14  1251 History   First MD Initiated Contact with Patient 09/05/14 1338     Chief Complaint  Patient presents with  . Abdominal Pain    Sign out patient from Mellody Drown PA-C at 4:15pm.  HPI Curtis Mann is a 28 y.o. male with PMH of quadriparesis, hypotension, neurogentic bladder and neurogenic bowel requiring in and out catheterizations and rectal stimulation every other day to have BM presenting with abdominal pain that is dull, started on Sunday with 101 temperature and became sharp last night. Pain located in LLQ, does not radiate. Patient reports one episode of diarrhea 4 days ago without blood. No melenotic stool. Patient having difficulty localizing pain due to old spinal cord injury.    Past Medical History  Diagnosis Date  . Quadriplegic spinal paralysis 2009  . Hypotension occasional  . Neurogenic bladder     foley and i and o caths  . Neurogenic bowel     bowel program with digital stimulation as needed   Past Surgical History  Procedure Laterality Date  . Surgery for spinal cord injury c 4 to c 6  2009  . Bladder botox surgery  2012  . Bicep and tricept tendon transfers bilateral  2011  . Cystoscopy with injection  10/11/2012    Procedure: CYSTOSCOPY WITH INJECTION;  Surgeon: Kathi Ludwig, MD;  Location: [facility];  Service: Urology;  Laterality: N/A;  botox  . Cystoscopy N/A 01/20/2014    Procedure: CYSTOSCOPY WITH BOTOX INJECTION;  Surgeon: Kathi Ludwig, MD;  Location: [facility];  Service: Urology;  Laterality: N/A;   History reviewed. No pertinent family history. History  Substance Use Topics  . Smoking status: Former Smoker -- 0.25 packs/day for 4 years    Types: Cigarettes    Quit date: 12/13/2007  . Smokeless tobacco: Never Used  . Alcohol Use: No    Review of Systems  Constitutional: Positive for fever. Negative for chills.  HENT: Negative for congestion and rhinorrhea.   Eyes: Negative for visual  disturbance.  Respiratory: Negative for cough and shortness of breath.   Cardiovascular: Negative for chest pain and palpitations.  Gastrointestinal: Positive for abdominal pain and diarrhea. Negative for nausea and vomiting.  Genitourinary: Negative for dysuria and hematuria.  Musculoskeletal: Negative for back pain and gait problem.  Skin: Negative for rash.  Neurological: Negative for weakness and headaches.      Allergies  Review of patient's allergies indicates no known allergies.  Home Medications   Prior to Admission medications   Medication Sig Start Date End Date Taking? Authorizing Provider  baclofen (LIORESAL) 20 MG tablet Take 20 mg by mouth 3 (three) times daily.   Yes Historical Provider, MD  Cranberry 400 MG CAPS Take 1 capsule by mouth daily.   Yes Historical Provider, MD  HYDROcodone-acetaminophen (NORCO/VICODIN) 5-325 MG per tablet Take 1 tablet by mouth every 6 (six) hours as needed for moderate pain. Pain   Yes Historical Provider, MD  Multiple Vitamin (MULTIVITAMIN WITH MINERALS) TABS tablet Take 1 tablet by mouth daily.   Yes Historical Provider, MD  nitrofurantoin (MACRODANTIN) 100 MG capsule Take 100 mg by mouth daily. 08/04/14  Yes Historical Provider, MD  oxybutynin (DITROPAN XL) 15 MG 24 hr tablet Take 30 mg by mouth every morning.    Yes Historical Provider, MD   BP 109/43  Pulse 62  Temp(Src) 97.5 F (36.4 C) (Oral)  Resp 12  Wt 120  lb (54.432 kg)  SpO2 98% Physical Exam  Nursing note and vitals reviewed. Constitutional: He appears well-developed and well-nourished. No distress.  HENT:  Head: Normocephalic and atraumatic.  Eyes: Conjunctivae and EOM are normal. Right eye exhibits no discharge. Left eye exhibits no discharge.  Cardiovascular: Normal rate, regular rhythm and normal heart sounds.   Pulmonary/Chest: Effort normal and breath sounds normal. No respiratory distress. He has no wheezes.  Abdominal: Soft. Bowel sounds are normal. He exhibits  no distension.  LLQ tenderness without rebound, guarding or signs of peritonitis. +BS  Neurological: He is alert. He exhibits normal muscle tone. Coordination normal.  Skin: Skin is warm and dry. He is not diaphoretic.    ED Course  Procedures (including critical care time) Labs Review Labs Reviewed  COMPREHENSIVE METABOLIC PANEL - Abnormal; Notable for the following:    Creatinine, Ser 0.47 (*)    Total Bilirubin 0.2 (*)    All other components within normal limits  URINE CULTURE  CBC WITH DIFFERENTIAL  URINALYSIS, ROUTINE W REFLEX MICROSCOPIC  I-STAT CG4 LACTIC ACID, ED    Imaging Review Ct Abdomen Pelvis W Contrast  09/05/2014   CLINICAL DATA:  Diffuse abdominal pain.  Quadriplegia.  EXAM: CT ABDOMEN AND PELVIS WITH CONTRAST  TECHNIQUE: Multidetector CT imaging of the abdomen and pelvis was performed using the standard protocol following bolus administration of intravenous contrast.  CONTRAST:  80mL OMNIPAQUE IOHEXOL 300 MG/ML  SOLN  COMPARISON:  CT 02/24/2011  FINDINGS: Lower chest: Lung bases are clear. No pleural fluid or pericardial fluid.  Hepatobiliary: No focal hepatic lesion. No biliary duct dilatation. Gallbladder is normal.  Pancreas: Pancreas is normal. No duct dilatation. No pancreatic inflammation.  Spleen: Spleen is normal  Adrenals/urinary tract: Adrenal glands and kidneys are normal. No hydronephrosis. No ureterolithiasis. There is gas within the bladder. Mild bladder wall thickening. There is a Foley catheter within the bladder.  Stomach/Bowel: The stomach and small bowel are normal. The appendix is normal. The ascending and transverse colon are normal. The descending and sigmoid colon are normal. The rectum demonstrates diffuse circumferential bowel wall thickening over approximately 10 cm segment from the sigmoid colon to the anus.  There is no evidence of perirectal abscess. There are small perirectal lymph nodes present (image 59, series 2). This rectal wall thickening /  edema is increased compared to prior.  Vascular/Lymphatic: Abdominal aorta is normal caliber. There is no retroperitoneal or periportal lymphadenopathy. No pelvic lymphadenopathy.  Reproductive: The prostate gland is normal. Foley catheter again noted.  Musculoskeletal: No acute findings  Other: No free fluid in the abdomen pelvis.  CTs.  IMPRESSION: 1. Diffuse circumferential bowel wall thickening of the rectum suggests proctitis. 2. Small perirectal lymph nodes are similar to  prior and reactive. 3. Gas with the bladder mild bladder wall thickening consistent with chronic cystitis and catheterization. 4. Normal appendix. 5. No free fluid or abscess in abdomen hour pelvis   Electronically Signed   By: Genevive Bi M.D.   On: 09/05/2014 17:06     EKG Interpretation None     Meds given in ED:  Medications  morphine 4 MG/ML injection 4 mg (4 mg Intravenous Given 09/05/14 1416)  sodium chloride 0.9 % bolus 1,000 mL (0 mLs Intravenous Stopped 09/05/14 1544)  iohexol (OMNIPAQUE) 300 MG/ML solution 25 mL (25 mLs Oral Contrast Given 09/05/14 1526)  morphine 4 MG/ML injection 4 mg (4 mg Intravenous Given 09/05/14 1628)  iohexol (OMNIPAQUE) 300 MG/ML solution 80 mL (80 mLs Intravenous  Contrast Given 09/05/14 1633)  sodium chloride 0.9 % bolus 1,000 mL (0 mLs Intravenous Stopped 09/05/14 2009)    Discharge Medication List as of 09/05/2014  7:29 PM        MDM   Final diagnoses:  Chronic nonspecific cystitis  Proctitis   Patient with history of quadriparesis presenting with one week of LLQ abdominal pain that is worsening and associated with remote fever and one episode of diarrhea. Labs without signs of infection in urine, CBC and CMP unremarkable. Patient unable to localize pain well and CT ordered. Patient with diffuse circumferential bowel thickening of rectum suggesting proctitis. Also with bladder thickening consistent with chronic cystitis.  Patient followed by [facility]. Consult to them and  spoke with on call physician. He agreed that proctitis most likely due to rectal stimulation to have BMs and stressed the importance of continuing to have BM. Call on Monday for appointment. He recommended no abx, steriod creams. Additionally patient with cystitis and to follow up with his urologist Dr. Patsi Sears. Patient encouraged to use stool softeners and metamucil. I have also discussed reasons to return immediately to the ER. Patient is afebrile, nontoxic, and in no acute distress. Patient is appropriate for outpatient management and is stable for discharge. Pain well controlled in ED.    Discussed return precautions with patient. Discussed all results and patient verbalizes understanding and agrees with plan.  This is a shared patient. This patient was discussed with the physician, Dr. Effie Shy who saw and evaluated the patient and agrees with the plan.      Louann Sjogren, PA-C 09/06/14 0121

## 2014-09-06 NOTE — ED Provider Notes (Signed)
Medical screening examination/treatment/procedure(s) were conducted as a shared visit with non-physician practitioner(s) and myself.  I personally evaluated the patient during the encounter  Please see my separate respective documentation pertaining to this patient encounter   Vida Roller, MD 09/06/14 [phone]

## 2014-09-07 NOTE — ED Provider Notes (Signed)
Medical screening examination/treatment/procedure(s) were performed by non-physician practitioner and as supervising physician I was immediately available for consultation/collaboration.   EKG Interpretation None       Flint Melter, MD 09/07/14 1154

## 2015-03-02 ENCOUNTER — Other Ambulatory Visit: Payer: Self-pay | Admitting: Urology

## 2015-03-30 ENCOUNTER — Encounter (HOSPITAL_COMMUNITY): Payer: Self-pay | Admitting: *Deleted

## 2015-04-08 MED ORDER — GENTAMICIN SULFATE 40 MG/ML IJ SOLN
260.0000 mg | INTRAVENOUS | Status: AC
  Administered 2015-04-09: 260 mg via INTRAVENOUS
  Filled 2015-04-08: qty 6.5

## 2015-04-08 NOTE — H&P (Signed)
Reason For Visit F/u to discuss Botox   Active Problems Problems  1. 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 Chronic cystitis (N30.20)   Assessed By: Jethro Bolusannenbaum, Karna Abed (Urology); Last Assessed: 25 Dec 2013 3. Closed fracture of C5-C7 level with spinal cord injury   Assessed By: Jethro Bolusannenbaum, Bambi Fehnel (Urology); Last Assessed: 03 Oct 2014 4. 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 Urinary incontinence (R32)   Assessed By: Jethro Bolusannenbaum, Robley Matassa (Urology); Last Assessed: 18 Feb 2015  History of Present Illness     29 YO quadriplegic male returns today to discuss repeating Botox. 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 He is very happy with results of Botox. Is CIC QID will leak mildly late in day but this is after several coffees/day. Can now go 7-8 hrs from bedtime to am CIC.    Dr. Redmond Schoolripp wants him off Ditropan , Benedryl and Ativan. ( awaiting literature).     He is on prophylactic antibiotics. He failed Toviaz and Myrbetriq in the past. He has oxybutinin at home.   Past Medical History Problems  1. History of No Medical Problems  Surgical History Problems  1. History of Cervical Vertebral Fusion 2. History of Cystoscopy (Diagnostic) 3. History of Urologic Surgery 4. History of Urologic Surgery  Current Meds 1. Baclofen 20 MG Oral Tablet;  Therapy: (Recorded:24Aug2011) to Recorded 2. Cranberry Concentrate CAPS;  Therapy: (Recorded:26Mar2012) to Recorded 3. Hydrocodone-Acetaminophen 5-325 MG Oral Tablet;  Therapy: 14Apr2013 to Recorded 4. Multi-Vitamin TABS;  Therapy: (Recorded:26Mar2012) to Recorded 5. Nitrofurantoin Macrocrystal 100 MG Oral Capsule; Take 1 capsule by mouth every day;  Therapy: 18Feb2014 to (Evaluate:12Mar2016)  Requested for: 19Mar2015; Last  Rx:18Mar2015 Ordered 6. Oxybutynin Chloride ER  15 MG Oral Tablet Extended Release 24 Hour; TAKE 2 TABLETS  BY MOUTH EVERY DAY;  Therapy: 09Jun2010 to (Evaluate:07Jun2015)  Requested for: 13Jun2014; Last  Rx:12Jun2014 Ordered  Allergies Medication  1. Ditropan  Family History Problems  1. Family history of Colon Cancer : Mother 2. Family history of Family Health Status - Father's Age   29 3. Family history of Mother Deceased At Age ____   7351, colon cancer 4. Family history of Prostate Cancer  Social History Problems  1. Denied: History of Alcohol Use 2. Caffeine Use   1 per day 3. Former Smoker   smoked x 3 yrs, quit 1 yr ago 4. Marital History - Single 5. Occupation:   disabled  Review of Systems Genitourinary, constitutional, skin, eye, otolaryngeal, hematologic/lymphatic, cardiovascular, pulmonary, endocrine, musculoskeletal, gastrointestinal, neurological and psychiatric system(s) were reviewed and pertinent findings if present are noted and are otherwise negative.  Genitourinary: incontinence and bladder spasms.    Vitals Vital Signs [Data Includes: Last 1 Day]  Recorded: 09Mar2016 10:36AM  Blood Pressure: 75 / 42 Temperature: 98.1 F Heart Rate: 75  Physical Exam Constitutional: Well nourished and well developed . No acute distress. Wheel-chair bound quadareplegic.  ENT:. The ears and nose are normal in appearance.  Neck: The appearance of the neck is normal and no neck mass is present.  Pulmonary: No respiratory distress and normal respiratory rhythm and effort.  Cardiovascular: Heart rate and rhythm are normal . No peripheral edema.  Abdomen: The abdomen is soft and nontender. No masses are palpated. No CVA tenderness. No hernias are palpable. No hepatosplenomegaly noted.  Genitourinary: Examination of the penis demonstrates no discharge, no masses, no lesions and a  normal meatus. The scrotum is without lesions. The right epididymis is palpably normal and non-tender. The left epididymis is palpably normal  and non-tender. The right testis is non-tender and without masses. The left testis is non-tender and without masses.  Lymphatics: The femoral and inguinal nodes are not enlarged or tender.  Skin: Normal skin turgor, no visible rash and no visible skin lesions.  Neuro/Psych:. Mood and affect are appropriate.    Assessment Assessed  1. 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 Urinary incontinence (R32) 3. 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 Urinary incontinence without sensory awareness (N39.42)  Renal u/s of kidneys in October, 2015 normal. He needs repeat Botox, and will need u/a. gent. coverage. Note hx of autonomic dysreflexia. Stop oxybutinin   Plan 1. Will need u/a prior to surgery Botox.   Discussion/Summary cc: Dr. Henry Tripp     Signatures Electronically signed by : Sheyanne Munley, M.D.; Feb 18 2015 11:13AM EST   

## 2015-04-09 ENCOUNTER — Ambulatory Visit (HOSPITAL_COMMUNITY): Payer: Medicare Other | Admitting: Anesthesiology

## 2015-04-09 ENCOUNTER — Ambulatory Visit (HOSPITAL_COMMUNITY)
Admission: RE | Admit: 2015-04-09 | Discharge: 2015-04-09 | Disposition: A | Discharge status: Home / Self Care | Payer: Medicare Other | Source: Ambulatory Visit | Attending: Urology | Admitting: Urology

## 2015-04-09 ENCOUNTER — Encounter (HOSPITAL_COMMUNITY)
Admission: RE | Disposition: A | Discharge status: Home / Self Care | Payer: Self-pay | Source: Ambulatory Visit | Attending: Urology

## 2015-04-09 ENCOUNTER — Encounter (HOSPITAL_COMMUNITY): Payer: Self-pay | Admitting: *Deleted

## 2015-04-09 DIAGNOSIS — N319 Neuromuscular dysfunction of bladder, unspecified: Secondary | ICD-10-CM

## 2015-04-09 DIAGNOSIS — G904 Autonomic dysreflexia: Secondary | ICD-10-CM | POA: Diagnosis not present

## 2015-04-09 DIAGNOSIS — G8254 Quadriplegia, C5-C7 incomplete: Secondary | ICD-10-CM | POA: Insufficient documentation

## 2015-04-09 DIAGNOSIS — Z87891 Personal history of nicotine dependence: Secondary | ICD-10-CM | POA: Insufficient documentation

## 2015-04-09 DIAGNOSIS — R32 Unspecified urinary incontinence: Secondary | ICD-10-CM | POA: Insufficient documentation

## 2015-04-09 DIAGNOSIS — N3020 Other chronic cystitis without hematuria: Secondary | ICD-10-CM | POA: Diagnosis not present

## 2015-04-09 HISTORY — DX: Myoneural disorder, unspecified: G70.9

## 2015-04-09 HISTORY — PX: CYSTOSCOPY: SHX5120

## 2015-04-09 LAB — CBC
HCT: 41 % (ref 39.0–52.0)
Hemoglobin: 13.9 g/dL (ref 13.0–17.0)
MCH: 29.4 pg (ref 26.0–34.0)
MCHC: 33.9 g/dL (ref 30.0–36.0)
MCV: 86.9 fL (ref 78.0–100.0)
PLATELETS: 237 10*3/uL (ref 150–400)
RBC: 4.72 MIL/uL (ref 4.22–5.81)
RDW: 13.1 % (ref 11.5–15.5)
WBC: 6.1 10*3/uL (ref 4.0–10.5)

## 2015-04-09 LAB — BASIC METABOLIC PANEL
Anion gap: 6 (ref 5–15)
BUN: 10 mg/dL (ref 6–23)
CHLORIDE: 109 mmol/L (ref 96–112)
CO2: 25 mmol/L (ref 19–32)
CREATININE: 0.55 mg/dL (ref 0.50–1.35)
Calcium: 8.8 mg/dL (ref 8.4–10.5)
GFR calc non Af Amer: >90 mL/min (ref >90)
Glucose, Bld: 96 mg/dL (ref 70–99)
Potassium: 3.9 mmol/L (ref 3.5–5.1)
SODIUM: 140 mmol/L (ref 135–145)

## 2015-04-09 SURGERY — CYSTOSCOPY
Anesthesia: General

## 2015-04-09 MED ORDER — MIDAZOLAM HCL 2 MG/2ML IJ SOLN
INTRAMUSCULAR | Status: AC
  Filled 2015-04-09: qty 2

## 2015-04-09 MED ORDER — PROPOFOL 10 MG/ML IV BOLUS
INTRAVENOUS | Status: DC | PRN
  Administered 2015-04-09: 160 mg via INTRAVENOUS

## 2015-04-09 MED ORDER — MIDAZOLAM HCL 5 MG/5ML IJ SOLN
INTRAMUSCULAR | Status: DC | PRN
  Administered 2015-04-09: 2 mg via INTRAVENOUS

## 2015-04-09 MED ORDER — FENTANYL CITRATE (PF) 100 MCG/2ML IJ SOLN
INTRAMUSCULAR | Status: DC | PRN
  Administered 2015-04-09: 50 ug via INTRAVENOUS

## 2015-04-09 MED ORDER — FENTANYL CITRATE (PF) 100 MCG/2ML IJ SOLN
INTRAMUSCULAR | Status: AC
  Filled 2015-04-09: qty 2

## 2015-04-09 MED ORDER — LACTATED RINGERS IV SOLN
INTRAVENOUS | Status: DC
  Administered 2015-04-09: 1000 mL via INTRAVENOUS

## 2015-04-09 MED ORDER — LIDOCAINE HCL (CARDIAC) 20 MG/ML IV SOLN
INTRAVENOUS | Status: DC | PRN
  Administered 2015-04-09: 50 mg via INTRAVENOUS

## 2015-04-09 MED ORDER — DEXAMETHASONE SODIUM PHOSPHATE 10 MG/ML IJ SOLN
INTRAMUSCULAR | Status: DC | PRN
  Administered 2015-04-09: 10 mg via INTRAVENOUS

## 2015-04-09 MED ORDER — CHLORHEXIDINE GLUCONATE 4 % EX LIQD: Freq: Once | CUTANEOUS | Status: DC

## 2015-04-09 MED ORDER — ONDANSETRON HCL 4 MG/2ML IJ SOLN
INTRAMUSCULAR | Status: DC | PRN
  Administered 2015-04-09: 4 mg via INTRAVENOUS

## 2015-04-09 MED ORDER — DEXAMETHASONE SODIUM PHOSPHATE 10 MG/ML IJ SOLN
INTRAMUSCULAR | Status: AC
  Filled 2015-04-09: qty 1

## 2015-04-09 MED ORDER — PROMETHAZINE HCL 25 MG/ML IJ SOLN: 6.2500 mg | INTRAMUSCULAR | Status: DC | PRN

## 2015-04-09 MED ORDER — SODIUM CHLORIDE 0.9 % IJ SOLN
INTRAMUSCULAR | Status: AC
  Filled 2015-04-09: qty 50

## 2015-04-09 MED ORDER — FENTANYL CITRATE (PF) 100 MCG/2ML IJ SOLN
25.0000 ug | INTRAMUSCULAR | Status: DC | PRN
Start: 2015-04-09 — End: 2015-04-09
  Administered 2015-04-09 (×2): 25 ug via INTRAVENOUS

## 2015-04-09 MED ORDER — ONABOTULINUMTOXINA 100 UNITS IJ SOLR
200.0000 [IU] | Freq: Once | INTRAMUSCULAR | Status: AC
  Administered 2015-04-09: 200 [IU] via INTRAMUSCULAR
  Filled 2015-04-09: qty 200

## 2015-04-09 MED ORDER — SODIUM CHLORIDE 0.9 % IR SOLN
Status: DC | PRN
  Administered 2015-04-09: 3000 mL

## 2015-04-09 MED ORDER — ONDANSETRON HCL 4 MG/2ML IJ SOLN
INTRAMUSCULAR | Status: AC
  Filled 2015-04-09: qty 2

## 2015-04-09 MED ORDER — PROPOFOL 10 MG/ML IV BOLUS
INTRAVENOUS | Status: AC
  Filled 2015-04-09: qty 20

## 2015-04-09 SURGICAL SUPPLY — 14 items
BAG URO CATCHER STRL LF (DRAPE) ×2 IMPLANT
BASKET ZERO TIP NITINOL 2.4FR (BASKET) IMPLANT
BSKT STON RTRVL ZERO TP 2.4FR (BASKET)
CATH FOLEY 2WAY SLVR  5CC 14FR (CATHETERS) ×1
CATH FOLEY 2WAY SLVR 5CC 14FR (CATHETERS) IMPLANT
CATH INTERMIT  6FR 70CM (CATHETERS) ×2 IMPLANT
GLOVE BIOGEL M STRL SZ7.5 (GLOVE) ×8 IMPLANT
GOWN STRL REUS W/TWL LRG LVL3 (GOWN DISPOSABLE) ×3 IMPLANT
GOWN STRL REUS W/TWL XL LVL3 (GOWN DISPOSABLE) ×2 IMPLANT
GUIDEWIRE STR DUAL SENSOR (WIRE) ×2 IMPLANT
MANIFOLD NEPTUNE II (INSTRUMENTS) ×2 IMPLANT
PACK CYSTO (CUSTOM PROCEDURE TRAY) ×2 IMPLANT
SCRUB PCMX 4 OZ (MISCELLANEOUS) ×1 IMPLANT
TUBING CONNECTING 10 (TUBING) ×2 IMPLANT

## 2015-04-09 NOTE — Anesthesia Postprocedure Evaluation (Signed)
  Anesthesia Post-op Note  Patient: Curtis Mann  Procedure(s) Performed: Procedure(s) (LRB): CYSTOSCOPY WITH BOTOX INJECTION (N/A)  Patient Location: PACU  Anesthesia Type: General  Level of Consciousness: awake and alert   Airway and Oxygen Therapy: Patient Spontanous Breathing  Post-op Pain: mild  Post-op Assessment: Post-op Vital signs reviewed, Patient's Cardiovascular Status Stable, Respiratory Function Stable, Patent Airway and No signs of Nausea or vomiting  Last Vitals:  Filed Vitals:   04/09/15 1309  BP: 104/57  Pulse:   Temp:   Resp: 16    Post-op Vital Signs: stable   Complications: No apparent anesthesia complications

## 2015-04-09 NOTE — Transfer of Care (Signed)
Immediate Anesthesia Transfer of Care Note  Patient: Curtis Mann  Procedure(s) Performed: Procedure(s): CYSTOSCOPY WITH BOTOX INJECTION (N/A)  Patient Location: PACU  Anesthesia Type:General  Level of Consciousness: sedated  Airway & Oxygen Therapy: Patient Spontanous Breathing and Patient connected to face mask oxygen  Post-op Assessment: Report given to RN and Post -op Vital signs reviewed and stable  Post vital signs: Reviewed and stable  Last Vitals:  Filed Vitals:   04/09/15 0905  BP: 106/63  Pulse: 68  Temp: 36.4 C  Resp: 18    Complications: No apparent anesthesia complications

## 2015-04-09 NOTE — Op Note (Signed)
Pre-operative diagnosis :  Neurogenic overactive bladder with incontinence  Postoperative diagnosis:  Same  Operation:  Intravesical Botox submucosal injection (200 units)  Surgeon:  S. Patsi Searsannenbaum, MD  First assistant:  None  Anesthesia:  General LMA  Preparation:  After appropriate pre-anesthesia, the patient was brought the operative room, placed on the operating table in the dorsal supine position where general LMA anesthesia was introduced. He was then replaced in the dorsal lithotomy position where pubis was prepped with Betadine solution and draped in usual fashion. No evidence of autonomic dysreflexia was noted. The patient was previously treated with antibiotic coverage for urinary tract infection, based on urine culture 4 days ago.  Review history:  Problems  1. 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 Chronic cystitis (N30.20)  Assessed By: Jethro Bolusannenbaum, Aireana Ryland (Urology); Last Assessed: 25 Dec 2013 3. Closed fracture of C5-C7 level with spinal cord injury  Assessed By: Jethro Bolusannenbaum, Leovardo Thoman (Urology); Last Assessed: 03 Oct 2014 4. 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 Urinary incontinence (R32)  Assessed By: Jethro Bolusannenbaum, Ashantia Amaral (Urology); Last Assessed: 18 Feb 2015  History of Present Illness    29 YO quadriplegic male returns today to discuss repeating Botox. 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 He is very happy with results of Botox. Is CIC QID will leak mildly late in day but this is after several coffees/day. Can now go 7-8 hrs from bedtime to am CIC.   Dr. Redmond Schoolripp wants him off Ditropan , Benedryl and Ativan. ( awaiting literature).     He is on prophylactic antibiotics. He failed Toviaz and Myrbetriq in the past. He has oxybutinin at home.     Statement of  Likelihood of Success: Excellent.  TIME-OUT observed.:  Procedure:  Cystoscopy was accomplished, showing normal urethral meatus, and normal pendulous urethra. The proximal urethra was normal. The prostate is normal in its structure. The bladder neck is normal. The bladder itself shows normal trigone, with clear reflux in both orifices. Trabeculation is noted. There is no cellule formation. I do not see bladder stone tumor or diverticular formation.  200 units of Botox is reconstituted in 20 mL of diluent, and is injected in 1 mL increments in the submucosa of the bladder, proximal to the trigone. No bleeding was noted. The patient tolerated the procedure well. Following this, the bladder was drained of fluid. A 14 French Foley catheter was left in the bladder, and is noted that the patient arrived with a 14 French Foley catheter in his bladder.  He was awakened, taken to recovery room in good condition.

## 2015-04-09 NOTE — Discharge Instructions (Signed)
Neurogenic Bladder °Neurogenic bladder is a loss of normal control of bladder function. This is caused by damaged nerves, which can be a result of a variety of injuries and diseases.  °The muscles and nerves of the urinary system work together to store urine and release urine at the right time. Nerves carry messages from the bladder to the brain and from the brain to the muscles of the bladder. In a neurogenic bladder, the nerves that are supposed to carry these messages do not work properly.There are 2 types of neurogenic bladder: °· Overactive.  The bladder is unable to control when or how much to urinate. Even when only a small amount of urine is in the bladder, there may be an urge to urinate that cannot be controlled. This can result in wetting accidents (urge incontinence). °· Underactive. The bladder holds much more urine than normal. Small amounts of urine leak out as bladder pressure builds because there is not a sensation that the bladder is full. This can also result in wetting accidents. °CAUSES  °Neurogenic bladder can be caused by many problems. These include:  °· Stroke. °· Multiple sclerosis. °· Infection. °· Trauma and injuries to the spine, spinal cord, depending on the level in the back of the injury. °· Diabetes. °· Parkinson's Disease. °· Brain and spinal cord tumors. °· Birth defects that affect the brain or spinal cord. °· Guillain-Barré syndrome. °· Complications of surgery involving the spine, spinal cord, or pelvis. °SYMPTOMS  °The following are the most common problems and symptoms of neurogenic bladder. However, each individual may experience symptoms differently.  °· Urinary tract infection symptoms: °¨ Pain or burning when urinating. °¨ Back or flank pain. °¨ Cloudy or bloody urine. °¨ Fever. °· Kidney stone symptoms. Symptoms of kidney stones include: °¨ Chills. °¨ Shivering. °¨ Feeling sick to your stomach (nausea) and/or vomiting. °¨ Fever. °· Loss of bladder control (urinary  incontinence). °· Small urine volume when emptying bladder (voiding). °· Urinary frequency and urgency. °· Dribbling urine. °· Loss of sensation of bladder fullness. °· Kidney failure. °· Infection in the blood stream (sepsis). °DIAGNOSIS  °When neurogenic bladder is suspected, both the nervous system and the bladder are examined. In addition to reviewing your complete medical history and having a physical exam, diagnostic procedures for neurogenic bladder may include: °· X-rays of the spine. °· Imaging tests of the kidneys, ureters, and bladder. These tests may include: °¨ MRI. °¨ CT scan. °¨ Ultrasound. °· Urodynamics or Cystometrogram (CMG). Thistests the nerves and muscles of the bladder. The test can show how much the bladder can hold and if it empties completely. °· EMG of the sphincter . This is measure of the electrical activity of the sphincter muscle and this helps determine when the sphincter is contracting or squeezing down. °· Video studies of the bladder and sphincter while you are urinating. °· Cystoscopy . Looking inside the bladder with a telescope like instrument. °TREATMENT  °Specific treatment for neurogenic bladder will be determined by your caregiver based on: °· Your age, overall health, and medical history. °· Severity of symptoms. °· Cause of the nerve damage. °· Type of bladder problem present. °· Your tolerance for specific medications, procedures, or therapies. °· Expectations for the course of the condition. °· Your opinion or preference. °Treatment may include: °· Antibiotic medicine. °· Medications. °· Insertion of a flexible tube (catheter) to empty the bladder. °· Surgery to create an artificial sphincterto prevent urinary leakage. °· Sacral nerve stimulation (SNS) to   help stimulate the nerves in order to empty the bladder. °· Sling surgery to hold the neck of the bladder and urethra in the proper position to prevent leakage. °· Bladder augmentation. °· Ileal loop surgery to connect  a section of intestine to the ureters and positioned out to a opening on the abdomen. Urine comes out and can be collected into a plastic bag. °· Perineal pads may be used to catch urine. °HOME CARE INSTRUCTIONS  °· Take all medications as prescribed by your caregiver. °· Review your medications (both prescription and non-prescription) with your caregiver to make sure medications you are presently taking will not be harmful. °· Periodic blood, urine, and imaging tests may be required. Follow your caregiver's advice regarding the timing of these. °· Follow the catheter care instructions if you use a catheter. °· Use disposiable underwear if you have bladder weakness. °SEEK MEDICAL CARE IF:  °· You have increasing fatigue or weakness. °· You find there is less and less control of the urine stream despite treatment. °· You develop a loss of appetite or nausea. °· You begin to have trouble inserting the catheter. °· Your urine appears somewhat dark, cloudy, or has an unusual smell. °SEEK IMMEDIATE MEDICAL CARE IF:  °· You develop worsening abdominal or back pain. °· You cannot pass any urine. °· Your urine becomes bloody. °· You experience a lot of burning while urinating. °· You have a fever. °· You keep vomiting. °· You have bleeding with catheter insertion. °Document Released: 06/11/2007 Document Revised: 11/14/2012 Document Reviewed: 03/11/2014 °Exit[employer]® Patient Information ©2015 [employer]. This information is not intended to replace advice given to you by your health care provider. Make sure you discuss any questions you have with your health care provider. ° °

## 2015-04-09 NOTE — Interval H&P Note (Signed)
History and Physical Interval Note:  04/09/2015 10:46 AM  Curtis Mann  has presented today for surgery, with the diagnosis of URINARY INCONTINENCE  The various methods of treatment have been discussed with the patient and family. After consideration of risks, benefits and other options for treatment, the patient has consented to  Procedure(s): CYSTOSCOPY (N/A) as a surgical intervention .  The patient's history has been reviewed, patient examined, no change in status, stable for surgery.  I have reviewed the patient's chart and labs.  Questions were answered to the patient's satisfaction.     Oluwadarasimi Favor I Scott Fix

## 2015-04-09 NOTE — Anesthesia Preprocedure Evaluation (Signed)
Anesthesia Evaluation  Patient identified by MRN, date of birth, ID band Patient awake    Reviewed: Allergy & Precautions, NPO status , Patient's Chart, lab work & pertinent test results  Airway Mallampati: II  TM Distance: >3 FB Neck ROM: Full    Dental no notable dental hx.    Pulmonary neg pulmonary ROS, former smoker,  breath sounds clear to auscultation  Pulmonary exam normal       Cardiovascular negative cardio ROS  Rhythm:Regular Rate:Normal     Neuro/Psych Quadriplegic spinal paralysis 2009    Neuromuscular disease negative psych ROS   GI/Hepatic negative GI ROS, Neg liver ROS,   Endo/Other  negative endocrine ROS  Renal/GU negative Renal ROS  negative genitourinary   Musculoskeletal negative musculoskeletal ROS (+)   Abdominal   Peds negative pediatric ROS (+)  Hematology negative hematology ROS (+)   Anesthesia Other Findings   Reproductive/Obstetrics negative OB ROS                             Anesthesia Physical Anesthesia Plan  ASA: III  Anesthesia Plan: General   Post-op Pain Management:    Induction: Intravenous  Airway Management Planned: LMA  Additional Equipment:   Intra-op Plan:   Post-operative Plan:   Informed Consent: I have reviewed the patients History and Physical, chart, labs and discussed the procedure including the risks, benefits and alternatives for the proposed anesthesia with the patient or authorized representative who has indicated his/her understanding and acceptance.   Dental advisory given  Plan Discussed with: CRNA and Surgeon  Anesthesia Plan Comments:         Anesthesia Quick Evaluation

## 2015-04-10 ENCOUNTER — Encounter (HOSPITAL_COMMUNITY): Payer: Self-pay | Admitting: Urology

## 2015-09-16 ENCOUNTER — Telehealth: Payer: Self-pay | Admitting: Geriatric Medicine

## 2015-09-16 NOTE — Telephone Encounter (Signed)
FYI. Please call Pt's father Molly Maduro and schedule appt at their convenience. Okay to put 2-15 minute appt's together if needed. Thank you.

## 2015-09-16 NOTE — Telephone Encounter (Signed)
Please advise 

## 2015-09-16 NOTE — Telephone Encounter (Signed)
I'll see him, please arrange a OV

## 2015-09-16 NOTE — Telephone Encounter (Signed)
Caller name:Robert Deyo Relationship to patient:father Can be reached:[phone] Pharmacy:  Reason for call:has medicare and medicaid. His dr is retiring and is needing to find a new dr. Would Dr Drue Novel be willing to take him on as new patient? Patient is a quadriplegic. If no, do you recommend a dr?

## 2015-09-17 NOTE — Telephone Encounter (Signed)
Father aware, unable to schedule until patient has [facility] switched to [facility].

## 2016-03-24 ENCOUNTER — Other Ambulatory Visit: Payer: Self-pay | Admitting: Urology

## 2016-03-24 MED ORDER — ONABOTULINUMTOXINA 100 UNITS IJ SOLR: 200.0000 [IU] | Freq: Once | INTRAMUSCULAR | Status: DC

## 2016-04-18 ENCOUNTER — Encounter (HOSPITAL_COMMUNITY): Payer: Self-pay | Admitting: *Deleted

## 2016-04-26 ENCOUNTER — Encounter (HOSPITAL_COMMUNITY): Payer: Self-pay | Admitting: *Deleted

## 2016-04-29 ENCOUNTER — Ambulatory Visit (HOSPITAL_COMMUNITY): Payer: Medicare Other | Admitting: Anesthesiology

## 2016-04-29 ENCOUNTER — Ambulatory Visit (HOSPITAL_COMMUNITY)
Admission: RE | Admit: 2016-04-29 | Discharge: 2016-04-29 | Disposition: A | Discharge status: Home / Self Care | Payer: Medicare Other | Source: Ambulatory Visit | Attending: Urology | Admitting: Urology

## 2016-04-29 ENCOUNTER — Encounter (HOSPITAL_COMMUNITY): Payer: Self-pay | Admitting: *Deleted

## 2016-04-29 ENCOUNTER — Encounter (HOSPITAL_COMMUNITY)
Admission: RE | Disposition: A | Discharge status: Home / Self Care | Payer: Self-pay | Source: Ambulatory Visit | Attending: Urology

## 2016-04-29 DIAGNOSIS — N3942 Incontinence without sensory awareness: Secondary | ICD-10-CM | POA: Insufficient documentation

## 2016-04-29 DIAGNOSIS — Z79899 Other long term (current) drug therapy: Secondary | ICD-10-CM | POA: Insufficient documentation

## 2016-04-29 DIAGNOSIS — Z87891 Personal history of nicotine dependence: Secondary | ICD-10-CM | POA: Insufficient documentation

## 2016-04-29 DIAGNOSIS — G8254 Quadriplegia, C5-C7 incomplete: Secondary | ICD-10-CM | POA: Insufficient documentation

## 2016-04-29 DIAGNOSIS — Z79891 Long term (current) use of opiate analgesic: Secondary | ICD-10-CM | POA: Insufficient documentation

## 2016-04-29 DIAGNOSIS — G904 Autonomic dysreflexia: Secondary | ICD-10-CM | POA: Diagnosis not present

## 2016-04-29 DIAGNOSIS — R32 Unspecified urinary incontinence: Secondary | ICD-10-CM | POA: Diagnosis present

## 2016-04-29 DIAGNOSIS — Z792 Long term (current) use of antibiotics: Secondary | ICD-10-CM | POA: Diagnosis not present

## 2016-04-29 DIAGNOSIS — N319 Neuromuscular dysfunction of bladder, unspecified: Secondary | ICD-10-CM

## 2016-04-29 HISTORY — PX: CYSTOSCOPY WITH INJECTION: SHX1424

## 2016-04-29 HISTORY — DX: Adverse effect of unspecified anesthetic, initial encounter: T41.45XA

## 2016-04-29 HISTORY — DX: Other complications of anesthesia, initial encounter: T88.59XA

## 2016-04-29 LAB — BASIC METABOLIC PANEL
ANION GAP: 9 (ref 5–15)
BUN: 11 mg/dL (ref 6–20)
CHLORIDE: 108 mmol/L (ref 101–111)
CO2: 24 mmol/L (ref 22–32)
CREATININE: 0.58 mg/dL — AB (ref 0.61–1.24)
Calcium: 9.1 mg/dL (ref 8.9–10.3)
GFR calc non Af Amer: >60 mL/min (ref >60)
Glucose, Bld: 94 mg/dL (ref 65–99)
POTASSIUM: 4.1 mmol/L (ref 3.5–5.1)
Sodium: 141 mmol/L (ref 135–145)

## 2016-04-29 LAB — CBC
HEMATOCRIT: 41.3 % (ref 39.0–52.0)
HEMOGLOBIN: 13.8 g/dL (ref 13.0–17.0)
MCH: 28.9 pg (ref 26.0–34.0)
MCHC: 33.4 g/dL (ref 30.0–36.0)
MCV: 86.4 fL (ref 78.0–100.0)
Platelets: 286 10*3/uL (ref 150–400)
RBC: 4.78 MIL/uL (ref 4.22–5.81)
RDW: 13.5 % (ref 11.5–15.5)
WBC: 6.5 10*3/uL (ref 4.0–10.5)

## 2016-04-29 SURGERY — CYSTOSCOPY, WITH INJECTION OF BLADDER NECK OR BLADDER WALL
Anesthesia: General

## 2016-04-29 MED ORDER — ONABOTULINUMTOXINA 100 UNITS IJ SOLR
100.0000 [IU] | Freq: Once | INTRAMUSCULAR | Status: DC
  Filled 2016-04-29: qty 100

## 2016-04-29 MED ORDER — DEXAMETHASONE SODIUM PHOSPHATE 10 MG/ML IJ SOLN
INTRAMUSCULAR | Status: AC
  Filled 2016-04-29: qty 1

## 2016-04-29 MED ORDER — CEFAZOLIN SODIUM-DEXTROSE 2-4 GM/100ML-% IV SOLN
2.0000 g | INTRAVENOUS | Status: AC
  Administered 2016-04-29: 2 g via INTRAVENOUS
  Filled 2016-04-29: qty 100

## 2016-04-29 MED ORDER — LIDOCAINE HCL (CARDIAC) 20 MG/ML IV SOLN
INTRAVENOUS | Status: DC | PRN
  Administered 2016-04-29: 60 mg via INTRAVENOUS

## 2016-04-29 MED ORDER — LACTATED RINGERS IV SOLN
INTRAVENOUS | Status: DC
  Administered 2016-04-29: 12:00:00 via INTRAVENOUS

## 2016-04-29 MED ORDER — FENTANYL CITRATE (PF) 100 MCG/2ML IJ SOLN
INTRAMUSCULAR | Status: DC | PRN
  Administered 2016-04-29 (×2): 50 ug via INTRAVENOUS

## 2016-04-29 MED ORDER — FENTANYL CITRATE (PF) 100 MCG/2ML IJ SOLN
INTRAMUSCULAR | Status: AC
  Filled 2016-04-29: qty 2

## 2016-04-29 MED ORDER — SODIUM CHLORIDE 0.9 % IJ SOLN: 100.0000 [IU] | Freq: Once | INTRAMUSCULAR | Status: DC

## 2016-04-29 MED ORDER — FENTANYL CITRATE (PF) 100 MCG/2ML IJ SOLN
25.0000 ug | INTRAMUSCULAR | Status: DC | PRN
  Administered 2016-04-29 (×2): 50 ug via INTRAVENOUS

## 2016-04-29 MED ORDER — SODIUM CHLORIDE 0.9 % IJ SOLN
INTRAMUSCULAR | Status: AC
  Filled 2016-04-29: qty 20

## 2016-04-29 MED ORDER — ONDANSETRON HCL 4 MG/2ML IJ SOLN
INTRAMUSCULAR | Status: DC | PRN
  Administered 2016-04-29: 4 mg via INTRAVENOUS

## 2016-04-29 MED ORDER — DEXAMETHASONE SODIUM PHOSPHATE 4 MG/ML IJ SOLN
INTRAMUSCULAR | Status: DC | PRN
  Administered 2016-04-29: 10 mg via INTRAVENOUS

## 2016-04-29 MED ORDER — ONDANSETRON HCL 4 MG/2ML IJ SOLN
INTRAMUSCULAR | Status: AC
  Filled 2016-04-29: qty 2

## 2016-04-29 MED ORDER — LIDOCAINE HCL (CARDIAC) 20 MG/ML IV SOLN
INTRAVENOUS | Status: AC
  Filled 2016-04-29: qty 5

## 2016-04-29 MED ORDER — PROPOFOL 10 MG/ML IV BOLUS
INTRAVENOUS | Status: AC
  Filled 2016-04-29: qty 20

## 2016-04-29 MED ORDER — HYOSCYAMINE SULFATE 0.125 MG SL SUBL: 0.1250 mg | SUBLINGUAL_TABLET | SUBLINGUAL | Status: DC | PRN

## 2016-04-29 MED ORDER — MIDAZOLAM HCL 2 MG/2ML IJ SOLN
INTRAMUSCULAR | Status: AC
  Filled 2016-04-29: qty 2

## 2016-04-29 MED ORDER — MIDAZOLAM HCL 5 MG/5ML IJ SOLN
INTRAMUSCULAR | Status: DC | PRN
  Administered 2016-04-29: 2 mg via INTRAVENOUS

## 2016-04-29 MED ORDER — CEFAZOLIN SODIUM-DEXTROSE 2-4 GM/100ML-% IV SOLN
INTRAVENOUS | Status: AC
  Filled 2016-04-29: qty 100

## 2016-04-29 MED ORDER — PROPOFOL 10 MG/ML IV BOLUS
INTRAVENOUS | Status: DC | PRN
  Administered 2016-04-29: 150 mg via INTRAVENOUS

## 2016-04-29 MED ORDER — ONABOTULINUMTOXINA 100 UNITS IJ SOLR
100.0000 [IU] | Freq: Once | INTRAMUSCULAR | Status: AC
  Administered 2016-04-29: 200 [IU] via INTRAMUSCULAR
  Filled 2016-04-29: qty 100

## 2016-04-29 MED ORDER — SODIUM CHLORIDE 0.9 % IJ SOLN
INTRAMUSCULAR | Status: DC | PRN
  Administered 2016-04-29: 20 mL

## 2016-04-29 SURGICAL SUPPLY — 18 items
BAG URO CATCHER STRL LF (MISCELLANEOUS) ×2 IMPLANT
CATH ROBINSON RED A/P 14FR (CATHETERS) ×2 IMPLANT
CLOTH BEACON ORANGE TIMEOUT ST (SAFETY) ×2 IMPLANT
DEFLUX NEEDLE (NEEDLE) IMPLANT
DEFLUX SYRINGE (SYRINGE) IMPLANT
GLOVE BIOGEL M STRL SZ7.5 (GLOVE) ×2 IMPLANT
GOWN STRL REUS W/TWL LRG LVL3 (GOWN DISPOSABLE) ×2 IMPLANT
GOWN STRL REUS W/TWL XL LVL3 (GOWN DISPOSABLE) ×2 IMPLANT
MANIFOLD NEPTUNE II (INSTRUMENTS) ×2 IMPLANT
NDL SAFETY ECLIPSE 18X1.5 (NEEDLE) IMPLANT
NDL SPNL 22GX7 QUINCKE BK (NEEDLE) ×1 IMPLANT
NEEDLE HYPO 18GX1.5 SHARP (NEEDLE)
NEEDLE SPNL 22GX7 QUINCKE BK (NEEDLE) ×2 IMPLANT
PACK CYSTO (CUSTOM PROCEDURE TRAY) ×2 IMPLANT
SCRUB PCMX 4 OZ (MISCELLANEOUS) ×2 IMPLANT
SYR CONTROL 10ML LL (SYRINGE) IMPLANT
TUBING CONNECTING 10 (TUBING) IMPLANT
WATER STERILE IRR 3000ML UROMA (IV SOLUTION) ×2 IMPLANT

## 2016-04-29 NOTE — Transfer of Care (Signed)
  Last Vitals:  Filed Vitals:   04/29/16 1048  BP: 92/58  Pulse: 74  Temp: 36.7 C  Resp: 16    Last Pain:  Filed Vitals:   04/29/16 1107  PainSc: 3       Patients Stated Pain Goal: 3 (04/29/16 1105)  Immediate Anesthesia Transfer of Care Note  Patient: Curtis Mann  Procedure(s) Performed: Procedure(s) (LRB): CYSTOSCOPY WITH INJECTION (N/A)  Patient Location: PACU  Anesthesia Type: General  Level of Consciousness: awake, alert  and oriented  Airway & Oxygen Therapy: Patient Spontanous Breathing and Patient connected to face mask oxygen  Post-op Assessment: Report given to PACU RN and Post -op Vital signs reviewed and stable  Post vital signs: Reviewed and stable  Complications: No apparent anesthesia complications

## 2016-04-29 NOTE — Discharge Instructions (Addendum)
Neurogenic Bladder Neurogenic bladder is a bladder control disorder. It is caused by problems with the nerves that control your bladder. This condition may make your bladder overactive or underactive. You may have trouble holding your urine or passing your urine (urinating). The bladder is a hollow organ in the lower part of your abdomen. It stores urine after the urine is made by your kidneys. Normally, when your bladder is not full, the muscles that control your bladder are relaxed. When your bladder fills with urine, nerve signals are sent to your brain, indicating that your bladder is full. Your brain then sends signals through your spinal cord to the muscles in your bladder that start and stop urine flow. If you have neurogenic bladder, the nerves and muscles do not work together the way they should. CAUSES  Any kind of nerve damage or condition that disrupts the signals from your brain to your bladder can cause neurogenic bladder. Many things can cause these nerve problems. They include:  A disease that affects the nervous system, such as:  Alzheimer disease.  Cerebral palsy.  Multiple sclerosis.  Diabetes.  Parkinson disease.  Damage to your brain or spinal cord from:  Trauma.  Tumor.  Infection.  Surgery.  Alcohol abuse.  Stroke.  A spinal cord birth defect. RISK FACTORS Having nerve damage or a nerve disorder puts you at risk for neurogenic bladder. SIGNS AND SYMPTOMS  Signs and symptoms of neurogenic bladder include:  Leaking or gushing urine (incontinence).  A sudden, strong urge to pass urine (urgency).  Frequent urination during the day and night.  Being unable to empty your bladder completely (urinary retention).  Frequent urinary tract infections. DIAGNOSIS  Your health care provider may diagnose neurogenic bladder based on your symptoms and medical history. A physical exam will also be done. You may be asked to keep a record of your bladder symptoms  and the times that you urinate (bladder diary). Your health care provider may also do several tests to help diagnose neurogenic bladder, including:  A urine test to check for infection.  A bladder scan after you urinate to see how much urine is left in your bladder.  Various tests to measure your urine flow and see how well the flow is controlled (urodynamic tests).  A procedure that involves using a tool that is like a very thin telescope to look through your urethra into your bladder (cystoscopy). A health care provider who specializes in the urinary tract (urologist) may do this test.  Imaging tests of your brain or spine. TREATMENT  Treatment depends on the cause of your neurogenic bladder and the symptoms you are having. Work closely with your health care provider to find the treatments that will improve your quality of life. Treatment options include:  Learning ways to control when you urinate, such as:  Urinating at scheduled times.  Training yourself to delay urination.  Doing exercises (Kegel exercises) to strengthen the muscles that control urine flow.  Avoiding foods or drinks that make your symptoms worse.  Taking medicines to:  Stimulate an underactive bladder.  Relax an overactive bladder.  Treat a urinary tract infection.  Learning how to use a thin tube (catheter) to empty your bladder. A catheter is a hollow tube that you pass through your urethra.  Procedures to stimulate the nerves that control your bladder.  Surgical procedures if other treatments do not help. HOME CARE INSTRUCTIONS   Keep a bladder diary to find out which foods, liquids, or activities  make your symptoms worse.  Use your bladder diary to schedule bathroom trips. If you are away from home, plan to be near a bathroom when your schedule says you need one.  Do Kegel exercises to strengthen the muscles that control urination. These muscles are the ones you use to try to hold urine when you  need to go. To do Kegel exercises:  Squeeze these muscles tight and hold for about 10 seconds.  Repeat three times.  Do these exercises often during the day when you do not have to urinate.  Limit your drinking of beverages that stimulate urination. These include soda, coffee, and tea.  After urinating, wait a few minutes and try again (double voiding).  Make sure you urinate just before you leave the house and just before you go to bed.  Take medicines only as directed by your health care provider.  Keep all follow-up visits as directed by your health care provider. This is important. SEEK MEDICAL CARE IF:   You are having a hard time controlling your symptoms.  Your symptoms are getting worse.  You have signs of a urinary tract infection:  A burning feeling when you urinate.  Chills.  Fever. SEEK IMMEDIATE MEDICAL CARE IF:  You cannot pass urine.    This information is not intended to replace advice given to you by your health care provider. Make sure you discuss any questions you have with your health care provider.   Document Released: 06/11/2007 Document Revised: 12/19/2014 Document Reviewed: 03/11/2014 Elsevier Interactive Patient Education 2016 [employer]      General Anesthesia, Adult, Care After Refer to this sheet in the next few weeks. These instructions provide you with information on caring for yourself after your procedure. Your health care provider may also give you more specific instructions. Your treatment has been planned according to current medical practices, but problems sometimes occur. Call your health care provider if you have any problems or questions after your procedure. WHAT TO EXPECT AFTER THE PROCEDURE After the procedure, it is typical to experience: Sleepiness. Nausea and vomiting. HOME CARE INSTRUCTIONS For the first 24 hours after general anesthesia: Have a responsible person with you. Do not drive a car. If you are alone, do  not take public transportation. Do not drink alcohol. Do not take medicine that has not been prescribed by your health care provider. Do not sign important papers or make important decisions. You may resume a normal diet and activities as directed by your health care provider. Change bandages (dressings) as directed. If you have questions or problems that seem related to general anesthesia, call the hospital and ask for the anesthetist or anesthesiologist on call. SEEK MEDICAL CARE IF: You have nausea and vomiting that continue the day after anesthesia. You develop a rash. SEEK IMMEDIATE MEDICAL CARE IF:  You have difficulty breathing. You have chest pain. You have any allergic problems.   This information is not intended to replace advice given to you by your health care provider. Make sure you discuss any questions you have with your health care provider.   Document Released: 03/06/2001 Document Revised: 12/19/2014 Document Reviewed: 03/28/2012 Elsevier Interactive Patient Education 2016 [employer] Foley Catheter Care, Adult A Foley catheter is a soft, flexible tube. This tube is placed into your bladder to drain pee (urine). If you go home with this catheter in place, follow the instructions below. TAKING CARE OF THE CATHETER Wash your hands with soap and water. Put soap and water on  a clean washcloth. Clean the skin where the tube goes into your body. Clean away from the tube site. Never wipe toward the tube. Clean the area using a circular motion. Remove all the soap. Pat the area dry with a clean towel. For males, reposition the skin that covers the end of the penis (foreskin). Attach the tube to your leg with tape or a leg strap. Do not stretch the tube tight. If you are using tape, remove any stickiness left behind by past tape you used. Keep the drainage bag below your hips. Keep it off the floor. Check your tube during the day. Make sure it is working and draining. Make  sure the tube does not curl, twist, or bend. Do not pull on the tube or try to take it out. TAKING CARE OF THE DRAINAGE BAGS You will have a large overnight drainage bag and a small leg bag. You may wear the overnight bag any time. Never wear the small bag at night. Follow the directions below. Emptying the Drainage Bag Empty your drainage bag when it is  - full or at least 2-3 times a day. Wash your hands with soap and water. Keep the drainage bag below your hips. Hold the dirty bag over the toilet or clean container. Open the pour spout at the bottom of the bag. Empty the pee into the toilet or container. Do not let the pour spout touch anything. Clean the pour spout with a gauze pad or cotton ball that has rubbing alcohol on it. Close the pour spout. Attach the bag to your leg with tape or a leg strap. Wash your hands well. Changing the Drainage Bag Change your bag once a month or sooner if it starts to smell or look dirty.  Wash your hands with soap and water. Pinch the rubber tube so that pee does not spill out. Disconnect the catheter tube from the drainage tube at the connection valve. Do not let the tubes touch anything. Clean the end of the catheter tube with an alcohol wipe. Clean the end of a the drainage tube with a different alcohol wipe. Connect the catheter tube to the drainage tube of the clean drainage bag. Attach the new bag to the leg with tape or a leg strap. Avoid attaching the new bag too tightly. Wash your hands well. Cleaning the Drainage Bag Wash your hands with soap and water. Wash the bag in warm, soapy water. Rinse the bag with warm water. Fill the bag with a mixture of white vinegar and water (1 cup vinegar to 1 quart warm water [.2 liter vinegar to 1 liter warm water]). Close the bag and soak it for 30 minutes in the solution. Rinse the bag with warm water. Hang the bag to dry with the pour spout open and hanging downward. Store the clean bag (once it is  dry) in a clean plastic bag. Wash your hands well. PREVENT INFECTION Wash your hands before and after touching your tube. Take showers every day. Wash the skin where the tube enters your body. Do not take baths. Replace wet leg straps with dry ones, if this applies. Do not use powders, sprays, or lotions on the genital area. Only use creams, lotions, or ointments as told by your doctor. For females, wipe from front to back after going to the bathroom. Drink enough fluids to keep your pee clear or pale yellow unless you are told not to have too much fluid (fluid restriction). Do not let  the drainage bag or tubing touch or lie on the floor. Wear cotton underwear to keep the area dry. GET HELP IF: Your pee is cloudy or smells unusually bad. Your tube becomes clogged. You are not draining pee into the bag or your bladder feels full. Your tube starts to leak. GET HELP RIGHT AWAY IF: You have pain, puffiness (swelling), redness, or yellowish-white fluid (pus) where the tube enters the body. You have pain in the belly (abdomen), legs, lower back, or bladder. You have a fever. You see blood fill the tube, or your pee is pink or red. You feel sick to your stomach (nauseous), throw up (vomit), or have chills. Your tube gets pulled out. MAKE SURE YOU:  Understand these instructions. Will watch your condition. Will get help right away if you are not doing well or get worse.   This information is not intended to replace advice given to you by your health care provider. Make sure you discuss any questions you have with your health care provider.   Document Released: 03/25/2013 Document Revised: 12/19/2014 Document Reviewed: 03/25/2013 Elsevier Interactive Patient Education [employer]

## 2016-04-29 NOTE — H&P (Signed)
Reason For Visit F/u to discuss Botox   Active Problems Problems  1. 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 Chronic cystitis (N30.20)   Assessed By: Jethro Bolusannenbaum, Lyrick Worland (Urology); Last Assessed: 25 Dec 2013 3. Closed fracture of C5-C7 level with spinal cord injury   Assessed By: Jethro Bolusannenbaum, Zahava Quant (Urology); Last Assessed: 03 Oct 2014 4. 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 Urinary incontinence (R32)   Assessed By: Jethro Bolusannenbaum, Tyliah Schlereth (Urology); Last Assessed: 18 Feb 2015  History of Present Illness     [age] YO quadriplegic male returns today to discuss repeating Botox. 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 He is very happy with results of Botox. Is CIC QID will leak mildly late in day but this is after several coffees/day. Can now go 7-8 hrs from bedtime to am CIC.    Dr. Redmond Schoolripp wants him off Ditropan , Benedryl and Ativan. ( awaiting literature).     He is on prophylactic antibiotics. He failed Toviaz and Myrbetriq in the past. He has oxybutinin at home.   Past Medical History Problems  1. History of No Medical Problems  Surgical History Problems  1. History of Cervical Vertebral Fusion 2. History of Cystoscopy (Diagnostic) 3. History of Urologic Surgery 4. History of Urologic Surgery  Current Meds 1. Baclofen 20 MG Oral Tablet;  Therapy: (Recorded:24Aug2011) to Recorded 2. Cranberry Concentrate CAPS;  Therapy: (Recorded:26Mar2012) to Recorded 3. Hydrocodone-Acetaminophen 5-325 MG Oral Tablet;  Therapy: 14Apr2013 to Recorded 4. Multi-Vitamin TABS;  Therapy: (Recorded:26Mar2012) to Recorded 5. Nitrofurantoin Macrocrystal 100 MG Oral Capsule; Take 1 capsule by mouth every day;  Therapy: 18Feb2014 to (Evaluate:12Mar2016)  Requested for: 19Mar2015; Last  Rx:18Mar2015 Ordered 6. Oxybutynin Chloride ER  15 MG Oral Tablet Extended Release 24 Hour; TAKE 2 TABLETS  BY MOUTH EVERY DAY;  Therapy: 09Jun2010 to (Evaluate:07Jun2015)  Requested for: 13Jun2014; Last  Rx:12Jun2014 Ordered  Allergies Medication  1. Ditropan  Family History Problems  1. Family history of Colon Cancer : Mother 2. Family history of Family Health Status - Father's Age   30 3. Family history of Mother Deceased At Age ____   3251, colon cancer 4. Family history of Prostate Cancer  Social History Problems  1. Denied: History of Alcohol Use 2. Caffeine Use   1 per day 3. Former Smoker   smoked x 3 yrs, quit 1 yr ago 4. Marital History - Single 5. Occupation:   disabled  Review of Systems Genitourinary, constitutional, skin, eye, otolaryngeal, hematologic/lymphatic, cardiovascular, pulmonary, endocrine, musculoskeletal, gastrointestinal, neurological and psychiatric system(s) were reviewed and pertinent findings if present are noted and are otherwise negative.  Genitourinary: incontinence and bladder spasms.    Vitals Vital Signs [Data Includes: Last 1 Day]  Recorded: 09Mar2016 10:36AM  Blood Pressure: 75 / 42 Temperature: 98.1 F Heart Rate: 75  Physical Exam Constitutional: Well nourished and well developed . No acute distress. Wheel-chair bound quadareplegic.  ENT:. The ears and nose are normal in appearance.  Neck: The appearance of the neck is normal and no neck mass is present.  Pulmonary: No respiratory distress and normal respiratory rhythm and effort.  Cardiovascular: Heart rate and rhythm are normal . No peripheral edema.  Abdomen: The abdomen is soft and nontender. No masses are palpated. No CVA tenderness. No hernias are palpable. No hepatosplenomegaly noted.  Genitourinary: Examination of the penis demonstrates no discharge, no masses, no lesions and a  normal meatus. The scrotum is without lesions. The right epididymis is palpably normal and non-tender. The left epididymis is palpably normal  and non-tender. The right testis is non-tender and without masses. The left testis is non-tender and without masses.  Lymphatics: The femoral and inguinal nodes are not enlarged or tender.  Skin: Normal skin turgor, no visible rash and no visible skin lesions.  Neuro/Psych:. Mood and affect are appropriate.    Assessment Assessed  1. 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 Urinary incontinence (R32) 3. 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 Urinary incontinence without sensory awareness (N39.42)  Renal u/s of kidneys in October, 2015 normal. He needs repeat Botox, and will need u/a. gent. coverage. Note hx of autonomic dysreflexia. Stop oxybutinin   Plan 1. Will need u/a prior to surgery Botox.   Discussion/Summary cc: Dr. Florentina Jenny     Signatures Electronically signed by : Jethro Bolus, M.D.; Feb 18 2015 11:13AM EST

## 2016-04-29 NOTE — Anesthesia Preprocedure Evaluation (Signed)
Anesthesia Evaluation  Patient identified by MRN, date of birth, ID band Patient awake    Reviewed: Allergy & Precautions, NPO status , reviewed documented beta blocker date and time   History of Anesthesia Complications (+) history of anesthetic complications  Airway Mallampati: II  TM Distance: >3 FB Neck ROM: Full    Dental   Pulmonary former smoker,    breath sounds clear to auscultation       Cardiovascular negative cardio ROS   Rhythm:Regular Rate:Normal     Neuro/Psych    GI/Hepatic negative GI ROS, Neg liver ROS,   Endo/Other  negative endocrine ROS  Renal/GU negative Renal ROS     Musculoskeletal   Abdominal   Peds  Hematology   Anesthesia Other Findings   Reproductive/Obstetrics                             Anesthesia Physical Anesthesia Plan  ASA: III  Anesthesia Plan: General   Post-op Pain Management:    Induction:   Airway Management Planned: LMA  Additional Equipment:   Intra-op Plan:   Post-operative Plan: Extubation in OR  Informed Consent: I have reviewed the patients History and Physical, chart, labs and discussed the procedure including the risks, benefits and alternatives for the proposed anesthesia with the patient or authorized representative who has indicated his/her understanding and acceptance.   Dental advisory given  Plan Discussed with: CRNA and Anesthesiologist  Anesthesia Plan Comments:         Anesthesia Quick Evaluation

## 2016-04-29 NOTE — Op Note (Signed)
Pre-operative diagnosis :  Urinary incontinence 2ndary to traumatic C-5 Quadraplegia  Postoperative diagnosis:  Same  Operation: cytoscopy, Submucosal Botox injection x 200 units  Surgeon:  S. Patsi Searsannenbaum, MD  First assistant:  None  Anesthesia:  General LMA  Preparation:  After appropriate preanesthesia, the patient was brought the [place], placed on the operating table in the dorsal supine position where general LMA anesthesia was introduced. He was then replaced in the dorsal lithotomy position with the pubis was prepped with Betadine solution and draped in usual fashion. The history was reviewed. The patient is noted to be a C5 spinal cord injury, with history of neurogenic bladder, with hyperreflexia, which has responded to submucosal Botox injection in the past. He has been covered with antibiotic, and anesthesia is alert to a history of autonomic dysreflexia. The patient our exhibits no symptoms of this problem during the procedure.  Review history:   Active Problems Problems  1. 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 Chronic cystitis (N30.20)  Assessed By: Jethro Bolusannenbaum, Belma Dyches (Urology); Last Assessed: 25 Dec 2013 3. Closed fracture of C5-C7 level with spinal cord injury  Assessed By: Jethro Bolusannenbaum, Judie Hollick (Urology); Last Assessed: 03 Oct 2014 4. 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 Urinary incontinence (R32)  Assessed By: Jethro Bolusannenbaum, Macarthur Lorusso (Urology); Last Assessed: 18 Feb 2015  History of Present Illness    30 YO quadriplegic male returns today to discuss repeating Botox. 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 He is very happy with results of Botox. Is CIC QID will leak mildly late in day but this is after several coffees/day. Can now go 7-8 hrs from bedtime to am CIC.   Dr. Redmond Schoolripp wants him off  Ditropan , Benedryl and Ativan. ( awaiting literature).     He is on prophylactic antibiotics. He failed Toviaz and Myrbetriq in the past. He has oxybutinin at home.   Statement of  Likelihood of Success: Excellent. TIME-OUT observed.:  Procedure:  [facility], and 200 units of Botox is injected in the bladder, with 20 separate 1 mL submucosal injections. The patient is felt to have an excellent treatment. No problems exist. A 1 mL "chaser" is also used at the end of the injection, to be sure all the Botox is delivered.  The bladder is drained of fluid, and the patient is awakened, taken to recovery room in good condition.

## 2016-04-29 NOTE — Anesthesia Procedure Notes (Signed)
Procedure Name: LMA Insertion Date/Time: 04/29/2016 12:15 PM Performed by: Norva PavlovALLAWAY, Curtis Fonda G Pre-anesthesia Checklist: Patient identified, Emergency Drugs available, Suction available and Patient being monitored Patient Re-evaluated:Patient Re-evaluated prior to inductionOxygen Delivery Method: Circle System Utilized Preoxygenation: Pre-oxygenation with 100% oxygen Intubation Type: IV induction Ventilation: Mask ventilation without difficulty LMA: LMA inserted LMA Size: 4.0 Number of attempts: 1 Airway Equipment and Method: bite block Placement Confirmation: positive ETCO2 Tube secured with: Tape Dental Injury: Teeth and Oropharynx as per pre-operative assessment

## 2016-04-29 NOTE — Anesthesia Postprocedure Evaluation (Signed)
Anesthesia Post Note  Patient: Noralee CharsLeo M Rossbach  Procedure(s) Performed: Procedure(s) (LRB): CYSTOSCOPY WITH INJECTION (N/A)  Patient location during evaluation: PACU Anesthesia Type: General Level of consciousness: awake Pain management: pain level controlled Vital Signs Assessment: post-procedure vital signs reviewed and stable Respiratory status: spontaneous breathing Cardiovascular status: stable Anesthetic complications: no    Last Vitals:  Filed Vitals:   04/29/16 1307 04/29/16 1315  BP: 125/73 131/78  Pulse: 69 68  Temp: 36.5 C   Resp: 12 19    Last Pain:  Filed Vitals:   04/29/16 1316  PainSc: 2                  EDWARDS,Astou Lada

## 2016-04-29 NOTE — Interval H&P Note (Signed)
History and Physical Interval Note:  04/29/2016 11:38 AM  Curtis Mann  has presented today for surgery, with the diagnosis of BLADDER SPASMS, URGE INCONTINENCE  The various methods of treatment have been discussed with the patient and family. After consideration of risks, benefits and other options for treatment, the patient has consented to  Procedure(s): CYSTOSCOPY WITH INJECTION (N/A) as a surgical intervention .  The patient's history has been reviewed, patient examined, no change in status, stable for surgery.  I have reviewed the patient's chart and labs.  Questions were answered to the patient's satisfaction.     Rogelio Waynick I Greenley Martone

## 2016-05-04 NOTE — H&P (Signed)
Reason For Visit Seen today for a yearly office visit.   Active Problems Problems  1. Urge incontinence of urine (N39.41)   Assessed By: Jetta LoutWarden, Diane (Urology); Last Assessed: 24 Mar 2016 2. Urinary incontinence without sensory awareness (N39.42)   Assessed By: Jetta LoutWarden, Diane (Urology); Last Assessed: 24 Mar 2016  History of Present Illness 30 YO male patient of Dr. Imelda Pillowannenbaum's seen today for a yearly office visit.     GU hx:  Hx of Botox. 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 He is very happy with results of Botox. Is CIC QID will leak mildly late in day but this is after several coffees/day. Can now go 7-8 hrs from bedtime to am CIC.     He is on prophylactic antibiotics. He failed Toviaz and Myrbetriq in the past. He has oxybutynin at home.     April 2017 Interval hx:   Today states that he can now tell its time for repeat Botox. Oxybutynin 15 mg 2 po PRN is no longer effective. He typically only uses this when he has foley in place which could range from 7-10 days/month to daily for several weeks. He is now concerned about the risk of dementia with this med but was [facility] that they did not have any information about this possible increase risk. Denies any other issues at this time.   Past Medical History Problems  1. History of No Medical Problems  Surgical History Problems  1. History of Cervical Vertebral Fusion 2. History of Diagnostic Cystoscopy 3. History of Urologic Surgery 4. History of Urologic Surgery 5. History of Urologic Surgery  Current Meds 1. Baclofen 20 MG Oral Tablet;  Therapy: (Recorded:24Aug2011) to Recorded 2. Cranberry Concentrate CAPS;  Therapy: (Recorded:26Mar2012) to Recorded 3. Hydrocodone-Acetaminophen 5-325 MG Oral Tablet;  Therapy: 14Apr2013 to Recorded 4. Multi-Vitamin TABS;  Therapy: (Recorded:26Mar2012) to Recorded 5.  Nitrofurantoin Macrocrystal 100 MG Oral Capsule; Take 1 capsule by mouth every day;  Therapy: 18Feb2014 to (Evaluate:12Mar2016)  Requested for: 19Mar2015; Last  Rx:18Mar2015 Ordered 6. Oxybutynin Chloride ER 15 MG Oral Tablet Extended Release 24 Hour; TAKE 2 TABLET  Daily;  Therapy: (Recorded:13Apr2017) to Recorded  Family History Problems  1. Family history of Colon Cancer : Mother 2. Family history of Family Health Status - Father's Age   30 3. Family history of Mother Deceased At Age ___   8251, colon cancer 4. Family history of Prostate Cancer  Social History Problems  1. Denied: History of Alcohol Use (History) 2. Caffeine Use   1 per day 3. Former Smoker   smoked x 3 yrs, quit 1 yr ago 4. Married 5. Occupation:   disabled  Review of Systems Genitourinary, constitutional, skin, eye, otolaryngeal, hematologic/lymphatic, cardiovascular, pulmonary, endocrine, musculoskeletal, gastrointestinal, neurological and psychiatric system(s) were reviewed and pertinent findings if present are noted and are otherwise negative.  Genitourinary: feelings of urinary urgency and incontinence.    Vitals Vital Signs [Data Includes: Last 1 Day]  Recorded: 13Apr2017 04:02PM  Blood Pressure: 91 / 53 Temperature: 98.2 F Heart Rate: 71  Physical Exam Constitutional: Well nourished and well developed . No acute distress. The patient appears well hydrated.  Abdomen: The abdomen is flat. The abdomen is soft and nontender.  Skin: Normal skin turgor and normal skin color and pigmentation.  Neuro/Psych:. Mood and affect are appropriate.    Results/Data  The following images/tracing/specimen were independently visualized:  RUS: right  kidney: 11.6 X 1.4 X 4.2 X 4.7 cm. Left kidney: 11.3 X 1.5 X4.7 X5.0 cm. Both appear normal.  PVR: Ultrasound PVR cath seen 0 ml.    Assessment Assessed  1. Urinary incontinence without sensory awareness (N39.42) 2. Urge incontinence of urine  (N39.41)  Plan Will proceed with elective repeat cysto/Botox injection 04/28/16 w/Dr. Patsi Sears. Risks and benefits discussed and pt voices understanding and wishes to proceed.   Signatures

## 2016-10-26 ENCOUNTER — Encounter (HOSPITAL_BASED_OUTPATIENT_CLINIC_OR_DEPARTMENT_OTHER): Payer: Self-pay | Admitting: *Deleted

## 2016-10-26 ENCOUNTER — Emergency Department (HOSPITAL_BASED_OUTPATIENT_CLINIC_OR_DEPARTMENT_OTHER)
Admission: EM | Admit: 2016-10-26 | Discharge: 2016-10-26 | Disposition: A | Discharge status: Home / Self Care | Payer: Worker's Compensation | Attending: Physician Assistant | Admitting: Physician Assistant

## 2016-10-26 ENCOUNTER — Emergency Department (HOSPITAL_BASED_OUTPATIENT_CLINIC_OR_DEPARTMENT_OTHER): Payer: Worker's Compensation

## 2016-10-26 DIAGNOSIS — Z79899 Other long term (current) drug therapy: Secondary | ICD-10-CM | POA: Diagnosis not present

## 2016-10-26 DIAGNOSIS — W050XXA Fall from non-moving wheelchair, initial encounter: Secondary | ICD-10-CM | POA: Insufficient documentation

## 2016-10-26 DIAGNOSIS — S0990XA Unspecified injury of head, initial encounter: Secondary | ICD-10-CM | POA: Diagnosis present

## 2016-10-26 DIAGNOSIS — Z791 Long term (current) use of non-steroidal anti-inflammatories (NSAID): Secondary | ICD-10-CM | POA: Insufficient documentation

## 2016-10-26 DIAGNOSIS — S060X0A Concussion without loss of consciousness, initial encounter: Secondary | ICD-10-CM | POA: Diagnosis not present

## 2016-10-26 DIAGNOSIS — Y999 Unspecified external cause status: Secondary | ICD-10-CM | POA: Diagnosis not present

## 2016-10-26 DIAGNOSIS — Z87891 Personal history of nicotine dependence: Secondary | ICD-10-CM | POA: Diagnosis not present

## 2016-10-26 DIAGNOSIS — Y929 Unspecified place or not applicable: Secondary | ICD-10-CM | POA: Insufficient documentation

## 2016-10-26 DIAGNOSIS — Y939 Activity, unspecified: Secondary | ICD-10-CM | POA: Diagnosis not present

## 2016-10-26 MED ORDER — OXYCODONE-ACETAMINOPHEN 5-325 MG PO TABS
1.0000 | ORAL_TABLET | Freq: Once | ORAL | Status: AC
  Administered 2016-10-26: 1 via ORAL
  Filled 2016-10-26: qty 1

## 2016-10-26 NOTE — ED Provider Notes (Signed)
[facility]-EMERGENCY DEPT Provider Note   CSN: [id number] Arrival date & time: 10/26/16  1758  By signing my name below, I, Linna DarnerRussell Turner, attest that this documentation has been prepared under the direction and in the presence of physician practitioner, Maikayla Beggs Randall AnLyn Kelvin Burpee, MD. Electronically Signed: Linna Darnerussell Turner, Scribe. 10/26/2016. 7:07 PM.  History   Chief Complaint Chief Complaint  Patient presents with  . Head Injury    The history is provided by the patient. No language interpreter was used.     HPI Comments: Noralee CharsLeo M Alsop is a 30 y.o. male with PMHx of quadriplegic spinal paralysis and neuromuscular disorder who presents to the Emergency Department complaining of a head injury that occurred shortly PTA. Pt is wheelchair-bound and states he fell backwards down two stairs and slammed the back of his head on concrete. No LOC. Pt states he was severely lightheaded for about a minute immediately after the incident but this has resolved. He endorses pain and an abrasion to his posterior head as well as some pain in his bilateral forearms. Tetanus UTD. He denies nausea, vomiting, or any other associated symptoms.  Past Medical History:  Diagnosis Date  . Complication of anesthesia    in 2015 patient states he woke up immediately after surgery , 2016- took him 30 minutes to wake up   . Hypotension occasional  . Neurogenic bladder    foley and i and o caths  . Neurogenic bowel    bowel program with digital stimulation as needed  . Neuromuscular disorder (HCC)    Quadriplegic uses electric wheelchair, Nurse, adultHoyer Lift or Two person Transfer  . Quadriplegic spinal paralysis (HCC) 2009    There are no active problems to display for this patient.   Past Surgical History:  Procedure Laterality Date  . bicep and tricept tendon transfers bilateral  2011  . bladder botox surgery  2012  . CYSTOSCOPY N/A 01/20/2014   Procedure: CYSTOSCOPY WITH BOTOX INJECTION;  Surgeon: Kathi LudwigSigmund I Tannenbaum,  MD;  Location: [facility];  Service: Urology;  Laterality: N/A;  . CYSTOSCOPY N/A 04/09/2015   Procedure: CYSTOSCOPY WITH BOTOX INJECTION;  Surgeon: Jethro BolusSigmund Tannenbaum, MD;  Location: [facility];  Service: Urology;  Laterality: N/A;  . CYSTOSCOPY WITH INJECTION  10/11/2012   Procedure: CYSTOSCOPY WITH INJECTION;  Surgeon: Kathi LudwigSigmund I Tannenbaum, MD;  Location: [facility];  Service: Urology;  Laterality: N/A;  botox  . CYSTOSCOPY WITH INJECTION N/A 04/29/2016   Procedure: CYSTOSCOPY WITH INJECTION;  Surgeon: Jethro BolusSigmund Tannenbaum, MD;  Location: [facility];  Service: Urology;  Laterality: N/A;  . 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 Authorizing Provider  baclofen (LIORESAL) 20 MG tablet Take 20 mg by mouth 3 (three) times daily.    Historical Provider, MD  Cranberry 400 MG CAPS Take 1 capsule by mouth daily.    Historical Provider, MD  HYDROcodone-acetaminophen (NORCO/VICODIN) 5-325 MG per tablet Take 1 tablet by mouth every 6 (six) hours as needed for moderate pain. Pain    Historical Provider, MD  hyoscyamine (LEVSIN/SL) 0.125 MG SL tablet Place 1 tablet (0.125 mg total) under the tongue every 4 (four) hours as needed. 04/29/16   Jethro BolusSigmund Tannenbaum, MD  Multiple Vitamin (MULTIVITAMIN WITH MINERALS) TABS tablet Take 1 tablet by mouth daily.    Historical Provider, MD    Family History History reviewed. No pertinent  family history.  Social History Social History  Substance Use Topics  . Smoking status: Former Smoker    Packs/day: 0.25    Years: 4.00    Types: Cigarettes    Quit date: 12/13/2007  . Smokeless tobacco: Never Used  . Alcohol use No     Allergies   Patient has no known allergies.   Review of Systems Review of Systems  Gastrointestinal: Negative for nausea and vomiting.  Musculoskeletal: Positive for myalgias (bilateral forearms).  Skin: Positive for wound (abrasion to  posterior head).  Neurological: Positive for light-headedness (resolved). Negative for syncope.     Physical Exam Updated Vital Signs BP (!) 83/51 (BP Location: Left Arm) Comment: pt states that is his normal B/P  Pulse 72   Temp 98.1 F (36.7 C)   Resp 16   Ht 6' (1.829 m)   Wt 140 lb (63.5 kg)   SpO2 99%   BMI 18.99 kg/m   Physical Exam  Constitutional: He is oriented to person, place, and time. He appears well-developed and well-nourished. No distress.  Wheelchair-bound  HENT:  Head: Normocephalic.  3 x 4 cm abrasion to back of his head  Eyes: Conjunctivae and EOM are normal.  Neck: Neck supple. No tracheal deviation present.  Cardiovascular: Normal rate.   Pulmonary/Chest: Effort normal. No respiratory distress.  Musculoskeletal: Normal range of motion.  Chronic contractures of bilateral lower extremities, patient feels baseline  Neurological: He is alert and oriented to person, place, and time. No cranial nerve deficit.  Cranial nerves II-XII intact  Skin: Skin is warm and dry.  Psychiatric: He has a normal mood and affect. His behavior is normal.  Nursing note and vitals reviewed.    ED Treatments / Results  Labs (all labs ordered are listed, but only abnormal results are displayed) Labs Reviewed - No data to display  EKG  EKG Interpretation None       Radiology No results found.  Procedures Procedures (including critical care time)  DIAGNOSTIC STUDIES: Oxygen Saturation is 97% on RA, normal by my interpretation.    COORDINATION OF CARE: 7:11 PM Discussed treatment plan with pt at bedside and pt agreed to plan.  Medications Ordered in ED Medications  oxyCODONE-acetaminophen (PERCOCET/ROXICET) 5-325 MG per tablet 1 tablet (1 tablet Oral Given 10/26/16 1924)     Initial Impression / Assessment and Plan / ED Course  I have reviewed the triage vital signs and the nursing notes.  Pertinent labs & imaging results that were available during my  care of the patient were reviewed by me and considered in my medical decision making (see chart for details).  Clinical Course     30 year old male in a wheelchair fell backwords striking his head.  Given force of injury and the prior surgery, will get CT head and neck.  CTs normal.  Tdap up to date.  Concussion precuations discussed. Patient at neurologic baselien.    Will discharge home.   I personally performed the services described in this documentation, which was scribed in my presence. The recorded information has been reviewed and is accurate.     Final Clinical Impressions(s) / ED Diagnoses   Final diagnoses:  Concussion without loss of consciousness, initial encounter    New Prescriptions Discharge Medication List as of 10/26/2016  9:13 PM       Soledad Budreau Randall AnLyn Jedidiah Demartini, MD 10/30/16 2237

## 2016-10-26 NOTE — ED Triage Notes (Signed)
Pt c/o head injury with abrasion to posterior head, also c/o neck pain and bil shoulder pain , pt denies LOC or vomiting.

## 2016-10-26 NOTE — ED Notes (Signed)
Patient transported to CT 

## 2016-12-12 ENCOUNTER — Emergency Department (HOSPITAL_COMMUNITY): Payer: Medicare Other

## 2016-12-12 ENCOUNTER — Encounter (HOSPITAL_COMMUNITY): Payer: Self-pay | Admitting: Emergency Medicine

## 2016-12-12 ENCOUNTER — Inpatient Hospital Stay (HOSPITAL_COMMUNITY)
Admission: EM | Admit: 2016-12-12 | Discharge: 2016-12-16 | DRG: 193 | Disposition: A | Discharge status: Home / Self Care | Payer: Medicare Other | Attending: Internal Medicine | Admitting: Internal Medicine

## 2016-12-12 DIAGNOSIS — K592 Neurogenic bowel, not elsewhere classified: Secondary | ICD-10-CM | POA: Diagnosis present

## 2016-12-12 DIAGNOSIS — Z466 Encounter for fitting and adjustment of urinary device: Secondary | ICD-10-CM

## 2016-12-12 DIAGNOSIS — R918 Other nonspecific abnormal finding of lung field: Secondary | ICD-10-CM | POA: Diagnosis not present

## 2016-12-12 DIAGNOSIS — Z681 Body mass index (BMI) 19 or less, adult: Secondary | ICD-10-CM | POA: Diagnosis not present

## 2016-12-12 DIAGNOSIS — R0600 Dyspnea, unspecified: Secondary | ICD-10-CM | POA: Diagnosis not present

## 2016-12-12 DIAGNOSIS — R0602 Shortness of breath: Secondary | ICD-10-CM | POA: Diagnosis not present

## 2016-12-12 DIAGNOSIS — R079 Chest pain, unspecified: Secondary | ICD-10-CM | POA: Diagnosis not present

## 2016-12-12 DIAGNOSIS — Z87891 Personal history of nicotine dependence: Secondary | ICD-10-CM

## 2016-12-12 DIAGNOSIS — D72829 Elevated white blood cell count, unspecified: Secondary | ICD-10-CM | POA: Diagnosis present

## 2016-12-12 DIAGNOSIS — Z79899 Other long term (current) drug therapy: Secondary | ICD-10-CM

## 2016-12-12 DIAGNOSIS — J180 Bronchopneumonia, unspecified organism: Secondary | ICD-10-CM | POA: Diagnosis not present

## 2016-12-12 DIAGNOSIS — Z981 Arthrodesis status: Secondary | ICD-10-CM

## 2016-12-12 DIAGNOSIS — R0789 Other chest pain: Secondary | ICD-10-CM | POA: Diagnosis not present

## 2016-12-12 DIAGNOSIS — J189 Pneumonia, unspecified organism: Secondary | ICD-10-CM | POA: Diagnosis present

## 2016-12-12 DIAGNOSIS — G8250 Quadriplegia, unspecified: Secondary | ICD-10-CM | POA: Diagnosis not present

## 2016-12-12 DIAGNOSIS — N319 Neuromuscular dysfunction of bladder, unspecified: Secondary | ICD-10-CM | POA: Diagnosis present

## 2016-12-12 DIAGNOSIS — J181 Lobar pneumonia, unspecified organism: Secondary | ICD-10-CM | POA: Diagnosis not present

## 2016-12-12 DIAGNOSIS — J40 Bronchitis, not specified as acute or chronic: Secondary | ICD-10-CM | POA: Diagnosis present

## 2016-12-12 DIAGNOSIS — Z993 Dependence on wheelchair: Secondary | ICD-10-CM

## 2016-12-12 LAB — DIFFERENTIAL
BASOS ABS: 0 10*3/uL (ref 0.0–0.1)
Basophils Relative: 0 %
Eosinophils Absolute: 0 10*3/uL (ref 0.0–0.7)
Eosinophils Relative: 0 %
LYMPHS ABS: 2 10*3/uL (ref 0.7–4.0)
LYMPHS PCT: 8 %
MONO ABS: 2.7 10*3/uL — AB (ref 0.1–1.0)
MONOS PCT: 11 %
NEUTROS ABS: 19.1 10*3/uL — AB (ref 1.7–7.7)
Neutrophils Relative %: 81 %

## 2016-12-12 LAB — BASIC METABOLIC PANEL
Anion gap: 12 (ref 5–15)
BUN: 12 mg/dL (ref 6–20)
CO2: 22 mmol/L (ref 22–32)
CREATININE: 0.51 mg/dL — AB (ref 0.61–1.24)
Calcium: 8.8 mg/dL — ABNORMAL LOW (ref 8.9–10.3)
Chloride: 102 mmol/L (ref 101–111)
Glucose, Bld: 112 mg/dL — ABNORMAL HIGH (ref 65–99)
POTASSIUM: 3.7 mmol/L (ref 3.5–5.1)
Sodium: 136 mmol/L (ref 135–145)

## 2016-12-12 LAB — CBC
HEMATOCRIT: 39.4 % (ref 39.0–52.0)
Hemoglobin: 13.5 g/dL (ref 13.0–17.0)
MCH: 29.3 pg (ref 26.0–34.0)
MCHC: 34.3 g/dL (ref 30.0–36.0)
MCV: 85.5 fL (ref 78.0–100.0)
Platelets: 316 10*3/uL (ref 150–400)
RBC: 4.61 MIL/uL (ref 4.22–5.81)
RDW: 13.3 % (ref 11.5–15.5)
WBC: 24.8 10*3/uL — ABNORMAL HIGH (ref 4.0–10.5)

## 2016-12-12 LAB — INFLUENZA PANEL BY PCR (TYPE A & B)
Influenza A By PCR: NEGATIVE
Influenza B By PCR: NEGATIVE

## 2016-12-12 LAB — I-STAT TROPONIN, ED: Troponin i, poc: 0 ng/mL (ref 0.00–0.08)

## 2016-12-12 LAB — I-STAT CG4 LACTIC ACID, ED: Lactic Acid, Venous: 0.6 mmol/L (ref 0.5–1.9)

## 2016-12-12 MED ORDER — ONDANSETRON HCL 4 MG/2ML IJ SOLN: 4.0000 mg | Freq: Four times a day (QID) | INTRAMUSCULAR | Status: DC | PRN

## 2016-12-12 MED ORDER — AZITHROMYCIN 250 MG PO TABS
250.0000 mg | ORAL_TABLET | ORAL | Status: DC
  Administered 2016-12-13: 250 mg via ORAL
  Filled 2016-12-12: qty 1

## 2016-12-12 MED ORDER — ACETAMINOPHEN 325 MG PO TABS
650.0000 mg | ORAL_TABLET | Freq: Four times a day (QID) | ORAL | Status: DC | PRN
Start: 2016-12-12 — End: 2016-12-16
  Administered 2016-12-13 – 2016-12-14 (×2): 650 mg via ORAL
  Filled 2016-12-12 (×2): qty 2

## 2016-12-12 MED ORDER — SODIUM CHLORIDE 0.9 % IV SOLN: INTRAVENOUS | Status: DC

## 2016-12-12 MED ORDER — FENTANYL CITRATE (PF) 100 MCG/2ML IJ SOLN
50.0000 ug | INTRAMUSCULAR | Status: DC | PRN
  Administered 2016-12-12: 50 ug via INTRAVENOUS
  Filled 2016-12-12: qty 2

## 2016-12-12 MED ORDER — DEXTROSE 5 % IV SOLN
1.0000 g | INTRAVENOUS | Status: DC
  Administered 2016-12-12: 1 g via INTRAVENOUS
  Filled 2016-12-12 (×2): qty 10

## 2016-12-12 MED ORDER — ONDANSETRON HCL 4 MG PO TABS: 4.0000 mg | ORAL_TABLET | Freq: Four times a day (QID) | ORAL | Status: DC | PRN

## 2016-12-12 MED ORDER — IOPAMIDOL (ISOVUE-300) INJECTION 61%
INTRAVENOUS | Status: AC
  Administered 2016-12-12: 75 mL
  Filled 2016-12-12: qty 75

## 2016-12-12 MED ORDER — ENOXAPARIN SODIUM 40 MG/0.4ML ~~LOC~~ SOLN
40.0000 mg | Freq: Every day | SUBCUTANEOUS | Status: DC
  Administered 2016-12-12 – 2016-12-15 (×4): 40 mg via SUBCUTANEOUS
  Filled 2016-12-12 (×4): qty 0.4

## 2016-12-12 MED ORDER — BACLOFEN 20 MG PO TABS
20.0000 mg | ORAL_TABLET | Freq: Three times a day (TID) | ORAL | Status: DC
  Administered 2016-12-12 – 2016-12-16 (×11): 20 mg via ORAL
  Filled 2016-12-12 (×11): qty 1

## 2016-12-12 MED ORDER — SODIUM CHLORIDE 0.9 % IV BOLUS (SEPSIS)
1000.0000 mL | Freq: Once | INTRAVENOUS | Status: AC
  Administered 2016-12-12: 1000 mL via INTRAVENOUS

## 2016-12-12 MED ORDER — ACETAMINOPHEN 650 MG RE SUPP: 650.0000 mg | Freq: Four times a day (QID) | RECTAL | Status: DC | PRN

## 2016-12-12 MED ORDER — DEXTROSE 5 % IV SOLN
500.0000 mg | Freq: Once | INTRAVENOUS | Status: AC
  Administered 2016-12-12: 500 mg via INTRAVENOUS
  Filled 2016-12-12: qty 500

## 2016-12-12 MED ORDER — HYDROCODONE-ACETAMINOPHEN 5-325 MG PO TABS
1.0000 | ORAL_TABLET | Freq: Four times a day (QID) | ORAL | Status: DC | PRN
  Administered 2016-12-12 – 2016-12-16 (×13): 1 via ORAL
  Filled 2016-12-12 (×13): qty 1

## 2016-12-12 NOTE — ED Notes (Signed)
Pt transported to CT scan.

## 2016-12-12 NOTE — Progress Notes (Signed)
CPT done. Patient caughed up a moderate amount of thick tan sputum.

## 2016-12-12 NOTE — ED Triage Notes (Signed)
Patient reports shortness of breath/chest discomfort starting last night. Also reports nasal congestion/cough x2 weeks.

## 2016-12-12 NOTE — H&P (Signed)
History and Physical  Patient Name: Curtis Mann     WUJ:[id number]    DOB: 1986-11-24    DOA: 12/12/2016 PCP: Florentina Jenny, MD   Patient coming from: Home  Chief Complaint: Cough, dyspnea, fever  HPI: Curtis Mann is a 31 y.o. male with a past medical history significant for spastic quadruparesis with indwelling foley who presents with cough and dyspnea.  The patient was in his usual state of health until about a week ago when he developed 1-2 days fever to 102F followed by upper respiratory infection symptoms of coryza, congestion, cough that seemed to be resolving (his daughter has also been sick at home).    Then last night, he was going to sleep when he had a sensation of being unable to breathe, and new left-sided chest discomfort, and urge to cough. He had his wife help him with cough maneuvers (because of his paralysis), and he was very uncomfortable and needed help essentially all night.  He then felt somewhat better for the morning, until this afternoon around 2PM when he worsened again, felt short of breath, difficulty bringing up sputum, chest congsetion, and chills, so he came to the ER.  ED course: -Afebrile, heart rate 90, respirations normal, pulse ox low 90s on room air, initial BP 95/64 -Na 136, K 3.7, Cr 0.5, WBC 24.8 K, Hgb 13.5 -Influenza PCR negative -Chest x-ray showed no focal opacities -CT of the neck was obtained to rule out upper airway obstruction causing his feeling of dyspnea which was normal -CT of the chest with contrast was obtained which showed a right lower lobe bronchopneumonia and bronchitis -He was given ceftriaxone and azithromycin and RT was consulted for chest PT and TRH were asked to evaluate for community acquired pneumonia     ROS: Review of Systems  Constitutional: Positive for chills, fever and malaise/fatigue.  Respiratory: Positive for cough and shortness of breath. Negative for hemoptysis and sputum production.   Cardiovascular: Positive  for chest pain. Negative for leg swelling.  Gastrointestinal: Negative for nausea and vomiting.  Skin: Positive for rash.  Neurological: Positive for tingling.          Past Medical History:  Diagnosis Date  . Complication of anesthesia    in 2015 patient states he woke up immediately after surgery , 2016- took him 30 minutes to wake up   . Hypotension occasional  . Neurogenic bladder    foley and i and o caths  . Neurogenic bowel    bowel program with digital stimulation as needed  . Neuromuscular disorder (HCC)    Quadriplegic uses electric wheelchair, Nurse, adult or Two person Transfer  . Quadriplegic spinal paralysis (HCC) 2009    Past Surgical History:  Procedure Laterality Date  . bicep and tricept tendon transfers bilateral  2011  . bladder botox surgery  2012  . CYSTOSCOPY N/A 01/20/2014   Procedure: CYSTOSCOPY WITH BOTOX INJECTION;  Surgeon: Kathi Ludwig, MD;  Location: [facility];  Service: Urology;  Laterality: N/A;  . CYSTOSCOPY N/A 04/09/2015   Procedure: CYSTOSCOPY WITH BOTOX INJECTION;  Surgeon: Jethro Bolus, MD;  Location: [facility];  Service: Urology;  Laterality: N/A;  . CYSTOSCOPY WITH INJECTION  10/11/2012   Procedure: CYSTOSCOPY WITH INJECTION;  Surgeon: Kathi Ludwig, MD;  Location: [facility];  Service: Urology;  Laterality: N/A;  botox  . CYSTOSCOPY WITH INJECTION N/A 04/29/2016   Procedure: CYSTOSCOPY WITH INJECTION;  Surgeon: Jethro Bolus, MD;  Location: [facility];  Service: Urology;  Laterality: N/A;  . surgery for spinal cord injury C 4 to C 6  2009    Social History: Patient lives with his wife and daughter.  The patient is wheelchair bound.  He is a Lawyer.  He is a former smoker.    No Known Allergies  Family history: family history includes Colon cancer in his mother; Congestive Heart Failure in his paternal grandfather; Hypertension in his father; Pulmonary embolism in his mother.  Prior to Admission medications     Medication Sig Start Date End Date Taking? Authorizing Provider  baclofen (LIORESAL) 20 MG tablet Take 20 mg by mouth 3 (three) times daily.   Yes Historical Provider, MD  HYDROcodone-acetaminophen (NORCO/VICODIN) 5-325 MG per tablet Take 1 tablet by mouth every 6 (six) hours as needed for moderate pain. Pain   Yes Historical Provider, MD  Multiple Vitamin (MULTIVITAMIN WITH MINERALS) TABS tablet Take 1 tablet by mouth daily.   Yes Historical Provider, MD       Physical Exam: BP 138/58 (BP Location: Left Arm)   Pulse 95   Temp 98.5 F (36.9 C) (Oral)   Resp 17   Ht 6' (1.829 m)   Wt 61.2 kg (135 lb)   SpO2 93%   BMI 18.31 kg/m  General appearance: Thin quadruplegic adult male, alert and in no acute distress, appears tired and ill.   Eyes: Anicteric, conjunctiva pink, lids and lashes normal. PERRL.    ENT: No nasal deformity, epistaxis.  Nose sounds congested, clear discharge only.  Hearing normal. OP moist without lesions.   Neck: No neck masses.  Trachea midline.  No thyromegaly/tenderness. Lymph: No cervical or supraclavicular lymphadenopathy. Skin: Warm and dry.  No jaundice.  No suspicious rashes or lesions.  The left 2nd toe is red, but without frank infection:   Cardiac: Tachycardic, regular, nl S1-S2, no murmurs appreciated.  Capillary refill is brisk.  JVP normal.  No LE edema, legs equal in size without redness.  Radial and DP pulses 2+ and symmetric. Respiratory: Normal respiratory rate and rhythm.  No wheezes.  Coarse airway sounds on right, more than left. Abdomen: Abdomen soft.  No TTP. No ascites, distension, hepatosplenomegaly.   MSK: Contracturse in both arms. Thenar wasting.  No cyanosis or clubbing. Neuro: Cranial nerves grossly normal. Speech is fluent.  Muscle strength diminihsed but symmetric in upper extremities, legs flaccid.    Psych: Sensorium intact and responding to questions, attention normal.  Behavior appropriate.  Affect normal.  Judgment and insight  appear normal.     Labs on Admission:  I have personally reviewed following labs and imaging studies: CBC:  Recent Labs Lab 12/12/16 1654  WBC 24.8*  NEUTROABS 19.1*  HGB 13.5  HCT 39.4  MCV 85.5  PLT 316   Basic Metabolic Panel:  Recent Labs Lab 12/12/16 1654  NA 136  K 3.7  CL 102  CO2 22  GLUCOSE 112*  BUN 12  CREATININE 0.51*  CALCIUM 8.8*   GFR: Estimated Creatinine Clearance: 116.9 mL/min (by C-G formula based on SCr of 0.51 mg/dL (L)).  Liver Function Tests: No results for input(s): AST, ALT, ALKPHOS, BILITOT, PROT, ALBUMIN in the last 168 hours. No results for input(s): LIPASE, AMYLASE in the last 168 hours. No results for input(s): AMMONIA in the last 168 hours. Coagulation Profile: No results for input(s): INR, PROTIME in the last 168 hours. Cardiac Enzymes: No results for input(s): CKTOTAL, CKMB, CKMBINDEX, TROPONINI in the last 168 hours. BNP (last 3 results) No  results for input(s): PROBNP in the last 8760 hours. HbA1C: No results for input(s): HGBA1C in the last 72 hours. CBG: No results for input(s): GLUCAP in the last 168 hours. Lipid Profile: No results for input(s): CHOL, HDL, LDLCALC, TRIG, CHOLHDL, LDLDIRECT in the last 72 hours. Thyroid Function Tests: No results for input(s): TSH, T4TOTAL, FREET4, T3FREE, THYROIDAB in the last 72 hours. Anemia Panel: No results for input(s): VITAMINB12, FOLATE, FERRITIN, TIBC, IRON, RETICCTPCT in the last 72 hours. Sepsis Labs: Lactate pending Invalid input(s): PROCALCITONIN, LACTICIDVEN No results found for this or any previous visit (from the past 240 hour(s)).       Radiological Exams on Admission: Personally reviewed: Dg Chest 2 View  Result Date: 12/12/2016 CLINICAL DATA:  Left chest pain since yesterday EXAM: CHEST  2 VIEW COMPARISON:  August 06, 2013 FINDINGS: The heart size and mediastinal contours are within normal limits. There is no focal infiltrate, pulmonary edema, or pleural effusion.  The visualized skeletal structures are unremarkable. IMPRESSION: No active cardiopulmonary disease. Electronically Signed   By: Sherian ReinWei-Chen  Lin M.D.   On: 12/12/2016 17:03   Ct Soft Tissue Neck W Contrast  Result Date: 12/12/2016 CLINICAL DATA:  Shortness of breath and chest discomfort beginning last night. Difficulty breathing. Quadriplegic since 2009. EXAM: CT NECK WITH CONTRAST TECHNIQUE: Multidetector CT imaging of the neck was performed using the standard protocol following the bolus administration of intravenous contrast. CONTRAST:  75mL ISOVUE-300 IOPAMIDOL (ISOVUE-300) INJECTION 61% COMPARISON:  10/26/2016 FINDINGS: Pharynx and larynx: No mucosal or submucosal lesion. Salivary glands: Submandibular and parotid glands are normal. Thyroid: Normal Lymph nodes: No enlarged or low-density nodes on either side of the neck. Vascular: Normal Limited intracranial: Normal Visualized orbits: Normal Mastoids and visualized paranasal sinuses: Clear Skeleton: Previous cervical fusion C3 through T1. Old fracture at C5. Upper chest: See results of chest CT. Other: None IMPRESSION: No acute or significant finding in the neck. Soft tissues are unremarkable. Old C5 fracture with fusion from C3 through T1. Electronically Signed   By: Paulina FusiMark  Shogry M.D.   On: 12/12/2016 20:13   Ct Chest W Contrast  Result Date: 12/12/2016 CLINICAL DATA:  Quadriplegia. Shortness of breath and chest discomfort. EXAM: CT CHEST WITH CONTRAST TECHNIQUE: Multidetector CT imaging of the chest was performed during intravenous contrast administration. CONTRAST:  75mL ISOVUE-300 IOPAMIDOL (ISOVUE-300) INJECTION 61% COMPARISON:  Radiography same day FINDINGS: Cardiovascular: Heart size is normal. Systemic arterial structures are normal. No pulmonary arterial abnormality seen. Mediastinum/Nodes: No mass or lymphadenopathy. Lungs/Pleura: Mucous layering in the dependent trachea. Bronchial tree on the left is patent and the left lung is clear. Bronchial  tree on the right shows filling of the bronchial airways in the right lower lobe with developing alveolar infiltrates consistent with bronchopneumonia. No pleural effusion on either side. Upper Abdomen: Normal Musculoskeletal: No abnormality in the bony thorax. IMPRESSION: Opacification of the right lower lobe bronchi with developing alveolar infiltrates in the right lower lobe consistent with right lower lobe bronchopneumonia. Electronically Signed   By: Paulina FusiMark  Shogry M.D.   On: 12/12/2016 20:17    EKG: Independently reviewed. Rate 4710m, QTc 437, RSR' pattern, old.    Assessment/Plan  1. Right lower lobe community acquired pneumonia:  He presents with dyspnea, chills, cough, and cough productive of thick sputum and CT chest showing developing bronchopneumonia in the RLL.  PE doubted given pneumonia symptoms.  Viral bronchitis within differential.   -Ceftriaxone and azithromycin -Sputum culture -Strep pneumo urine antigen -Follow blood cultures -Check lactic acid -  IVF overnight -RT consult for chest PT   2. Spastic quadruparesis:  -Continue home baclofen  -Continue hydrocodone PRN       DVT prophylaxis: Lovenox  Code Status: FULL  Family Communication: Father at bedside  Disposition Plan: Anticipate IV fluids and antibiotics and chest PT.  WIll make OBS status for now given reassuring SpO2, heart rate, respiratory rate and CXR.   Consults called: RT Admission status: OBS At the point of initial evaluation, it is my clinical opinion that admission for OBSERVATION is reasonable and necessary because the patient's presenting complaints in the context of their chronic conditions represent sufficient risk of deterioration or significant morbidity to constitute reasonable grounds for close observation in the hospital setting, but that the patient may be medically stable for discharge from the hospital within 24 to 48 hours.    Medical decision making: Patient seen at 9:31 PM on  12/12/2016.  The patient was discussed with Dr. Clydene Pugh.  What exists of the patient's chart was reviewed in depth and summarized above.  Clinical condition: hemodynamically stable in ER with fluids, respiratory status appears for medical floor.        Alberteen Sam [facility] Pager [phone]

## 2016-12-12 NOTE — ED Provider Notes (Signed)
[facility]ENCY [facility]PT Provider Note   CSN: [id number] Arrival date & time: 12/12/16  1619     History   Chief Complaint Chief Complaint  Patient presents with  . Shortness of Breath  . Chest Pain    HPI Curtis Mann is a 31 y.o. male.  The history is provided by the patient.  Cough  This is a new problem. The current episode started 2 days ago. The problem occurs constantly. The problem has been rapidly worsening. The cough is productive of sputum (Pt has difficulty coughing). There has been no fever. Associated symptoms include shortness of breath (and feeling of airway closing). Pertinent negatives include no chest pain. He has tried nothing for the symptoms. He is not a smoker. Past medical history comments: quadriplegia.    Past Medical History:  Diagnosis Date  . Complication of anesthesia    in 2015 patient states he woke up immediately after surgery , 2016- took him 30 minutes to wake up   . Hypotension occasional  . Neurogenic bladder    foley and i and o caths  . Neurogenic bowel    bowel program with digital stimulation as needed  . Neuromuscular disorder (HCC)    Quadriplegic uses electric wheelchair, Nurse, adult or Two person Transfer  . Quadriplegic spinal paralysis (HCC) 2009    There are no active problems to display for this patient.   Past Surgical History:  Procedure Laterality Date  . bicep and tricept tendon transfers bilateral  2011  . bladder botox surgery  2012  . CYSTOSCOPY N/A 01/20/2014   Procedure: CYSTOSCOPY WITH BOTOX INJECTION;  Surgeon: Kathi Ludwig, MD;  Location: [facility];  Service: Urology;  Laterality: N/A;  . CYSTOSCOPY N/A 04/09/2015   Procedure: CYSTOSCOPY WITH BOTOX INJECTION;  Surgeon: Jethro Bolus, MD;  Location: [facility];  Service: Urology;  Laterality: N/A;  . CYSTOSCOPY WITH INJECTION  10/11/2012   Procedure: CYSTOSCOPY WITH INJECTION;  Surgeon: Kathi Ludwig, MD;  Location: [facility];  Service: Urology;  Laterality:  N/A;  botox  . CYSTOSCOPY WITH INJECTION N/A 04/29/2016   Procedure: CYSTOSCOPY WITH INJECTION;  Surgeon: Jethro Bolus, MD;  Location: [facility];  Service: Urology;  Laterality: N/A;  . 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 Authorizing Provider  baclofen (LIORESAL) 20 MG tablet Take 20 mg by mouth 3 (three) times daily.    Historical Provider, MD  Cranberry 400 MG CAPS Take 1 capsule by mouth daily.    Historical Provider, MD  HYDROcodone-acetaminophen (NORCO/VICODIN) 5-325 MG per tablet Take 1 tablet by mouth every 6 (six) hours as needed for moderate pain. Pain    Historical Provider, MD  hyoscyamine (LEVSIN/SL) 0.125 MG SL tablet Place 1 tablet (0.125 mg total) under the tongue every 4 (four) hours as needed. 04/29/16   Jethro Bolus, MD  Multiple Vitamin (MULTIVITAMIN WITH MINERALS) TABS tablet Take 1 tablet by mouth daily.    Historical Provider, MD    Family History No family history on file.  Social History Social History  Substance Use Topics  . Smoking status: Former Smoker    Packs/day: 0.25    Years: 4.00    Types: Cigarettes    Quit date: 12/13/2007  . Smokeless tobacco: Never Used  . Alcohol use No     Allergies   Patient has  no known allergies.   Review of Systems Review of Systems  Respiratory: Positive for cough and shortness of breath (and feeling of airway closing).   Cardiovascular: Negative for chest pain.  All other systems reviewed and are negative.    Physical Exam Updated Vital Signs BP 95/64 (BP Location: Left Arm)   Pulse 90   Temp 98.5 F (36.9 C) (Oral)   Resp 17   Ht 6' (1.829 m)   Wt 135 lb (61.2 kg)   SpO2 94%   BMI 18.31 kg/m   Physical Exam  Constitutional: He is oriented to person, place, and time. He appears well-developed and well-nourished. No distress.  HENT:  Head: Normocephalic and atraumatic.    Nose: Nose normal.  Eyes: Conjunctivae are normal.  Neck: Neck supple. No tracheal deviation present.  Cardiovascular: Normal rate, regular rhythm and normal heart sounds.   Pulmonary/Chest: Effort normal. No respiratory distress.  Diffuse rhonchi bilaterally  Abdominal: Soft. He exhibits no distension. There is no tenderness.  Neurological: He is alert and oriented to person, place, and time.  Skin: Skin is warm and dry. Capillary refill takes less than 2 seconds.  Psychiatric: He has a normal mood and affect.     ED Treatments / Results  Labs (all labs ordered are listed, but only abnormal results are displayed) Labs Reviewed  BASIC METABOLIC PANEL - Abnormal; Notable for the following:       Result Value   Glucose, Bld 112 (*)    Creatinine, Ser 0.51 (*)    Calcium 8.8 (*)    All other components within normal limits  CBC - Abnormal; Notable for the following:    WBC 24.8 (*)    All other components within normal limits  I-STAT TROPOININ, ED    EKG  EKG Interpretation  Date/Time:  Monday December 12 2016 17:19:45 EST Ventricular Rate:  90 PR Interval:    QRS Duration: 105 QT Interval:  357 QTC Calculation: 437 R Axis:   79 Text Interpretation:  Sinus rhythm RSR' in V1 or V2, probably normal variant Borderline repolarization abnormality Benign early repolarization No significant change since last tracing Confirmed by Danyell Awbrey MD, Sanii Kukla [phone]([id number]) on 12/12/2016 5:38:42 PM        Radiology Dg Chest 2 View  Result Date: 12/12/2016 CLINICAL DATA:  Left chest pain since yesterday EXAM: CHEST  2 VIEW COMPARISON:  August 06, 2013 FINDINGS: The heart size and mediastinal contours are within normal limits. There is no focal infiltrate, pulmonary edema, or pleural effusion. The visualized skeletal structures are unremarkable. IMPRESSION: No active cardiopulmonary disease. Electronically Signed   By: Sherian ReinWei-Chen  Lin M.D.   On: 12/12/2016 17:03   Ct Soft Tissue Neck W Contrast  Result  Date: 12/12/2016 CLINICAL DATA:  Shortness of breath and chest discomfort beginning last night. Difficulty breathing. Quadriplegic since 2009. EXAM: CT NECK WITH CONTRAST TECHNIQUE: Multidetector CT imaging of the neck was performed using the standard protocol following the bolus administration of intravenous contrast. CONTRAST:  75mL ISOVUE-300 IOPAMIDOL (ISOVUE-300) INJECTION 61% COMPARISON:  10/26/2016 FINDINGS: Pharynx and larynx: No mucosal or submucosal lesion. Salivary glands: Submandibular and parotid glands are normal. Thyroid: Normal Lymph nodes: No enlarged or low-density nodes on either side of the neck. Vascular: Normal Limited intracranial: Normal Visualized orbits: Normal Mastoids and visualized paranasal sinuses: Clear Skeleton: Previous cervical fusion C3 through T1. Old fracture at C5. Upper chest: See results of chest CT. Other: None IMPRESSION: No acute or significant finding in the neck. Soft  tissues are unremarkable. Old C5 fracture with fusion from C3 through T1. Electronically Signed   By: Paulina Fusi M.D.   On: 12/12/2016 20:13   Ct Chest W Contrast  Result Date: 12/12/2016 CLINICAL DATA:  Quadriplegia. Shortness of breath and chest discomfort. EXAM: CT CHEST WITH CONTRAST TECHNIQUE: Multidetector CT imaging of the chest was performed during intravenous contrast administration. CONTRAST:  75mL ISOVUE-300 IOPAMIDOL (ISOVUE-300) INJECTION 61% COMPARISON:  Radiography same day FINDINGS: Cardiovascular: Heart size is normal. Systemic arterial structures are normal. No pulmonary arterial abnormality seen. Mediastinum/Nodes: No mass or lymphadenopathy. Lungs/Pleura: Mucous layering in the dependent trachea. Bronchial tree on the left is patent and the left lung is clear. Bronchial tree on the right shows filling of the bronchial airways in the right lower lobe with developing alveolar infiltrates consistent with bronchopneumonia. No pleural effusion on either side. Upper Abdomen: Normal  Musculoskeletal: No abnormality in the bony thorax. IMPRESSION: Opacification of the right lower lobe bronchi with developing alveolar infiltrates in the right lower lobe consistent with right lower lobe bronchopneumonia. Electronically Signed   By: Paulina Fusi M.D.   On: 12/12/2016 20:17    Procedures Procedures (including critical care time)  Medications Ordered in ED Medications  cefTRIAXone (ROCEPHIN) 1 g in dextrose 5 % 50 mL IVPB (0 g Intravenous Stopped 12/12/16 2202)  baclofen (LIORESAL) tablet 20 mg (20 mg Oral Given 12/12/16 2336)  HYDROcodone-acetaminophen (NORCO/VICODIN) 5-325 MG per tablet 1 tablet (1 tablet Oral Given 12/12/16 2336)  azithromycin (ZITHROMAX) tablet 250 mg (not administered)  enoxaparin (LOVENOX) injection 40 mg (40 mg Subcutaneous Given by Other 12/12/16 2352)  0.9 %  sodium chloride infusion (not administered)  acetaminophen (TYLENOL) tablet 650 mg (not administered)    Or  acetaminophen (TYLENOL) suppository 650 mg (not administered)  ondansetron (ZOFRAN) tablet 4 mg (not administered)    Or  ondansetron (ZOFRAN) injection 4 mg (not administered)  sodium chloride 0.9 % bolus 1,000 mL (0 mLs Intravenous Stopped 12/12/16 2129)  iopamidol (ISOVUE-300) 61 % injection (75 mLs  Contrast Given 12/12/16 1941)  azithromycin (ZITHROMAX) 500 mg in dextrose 5 % 250 mL IVPB (500 mg Intravenous Transfusing/Transfer 12/12/16 2248)     Initial Impression / Assessment and Plan / ED Course  I have reviewed the triage vital signs and the nursing notes.  Pertinent labs & imaging results that were available during my care of the patient were reviewed by me and considered in my medical decision making (see chart for details).  Clinical Course     31 y.o. male presents with cough and episode of choking and inability to clear secretions occurring last night. Today he has had persistent weak cough. High risk d/t quadriplegia. High WBC count with negative XR, has h/o trach remotely, will  CT neck and chest for mucous plugging or subglottic stenosis. CT shows RLL CAP which is c/w Pt's leukocytosis. Will admit for chest PT and IV ABx with high risk demographic. Hospitalist was consulted for admission and will see the patient in the emergency department.   Final Clinical Impressions(s) / ED Diagnoses   Final diagnoses:  Community acquired pneumonia of right lower lobe of lung (HCC)  Quadriplegia (HCC)  Leukocytosis, unspecified type    New Prescriptions New Prescriptions   No medications on file     Lyndal Pulley, MD 12/13/16 [phone]

## 2016-12-13 DIAGNOSIS — Z681 Body mass index (BMI) 19 or less, adult: Secondary | ICD-10-CM | POA: Diagnosis not present

## 2016-12-13 DIAGNOSIS — Z87891 Personal history of nicotine dependence: Secondary | ICD-10-CM | POA: Diagnosis not present

## 2016-12-13 DIAGNOSIS — D72829 Elevated white blood cell count, unspecified: Secondary | ICD-10-CM | POA: Diagnosis not present

## 2016-12-13 DIAGNOSIS — G8250 Quadriplegia, unspecified: Secondary | ICD-10-CM | POA: Diagnosis present

## 2016-12-13 DIAGNOSIS — J40 Bronchitis, not specified as acute or chronic: Secondary | ICD-10-CM | POA: Diagnosis present

## 2016-12-13 DIAGNOSIS — J181 Lobar pneumonia, unspecified organism: Secondary | ICD-10-CM

## 2016-12-13 DIAGNOSIS — N319 Neuromuscular dysfunction of bladder, unspecified: Secondary | ICD-10-CM | POA: Diagnosis present

## 2016-12-13 DIAGNOSIS — Z993 Dependence on wheelchair: Secondary | ICD-10-CM | POA: Diagnosis not present

## 2016-12-13 DIAGNOSIS — Z466 Encounter for fitting and adjustment of urinary device: Secondary | ICD-10-CM | POA: Diagnosis not present

## 2016-12-13 DIAGNOSIS — R0602 Shortness of breath: Secondary | ICD-10-CM | POA: Diagnosis not present

## 2016-12-13 DIAGNOSIS — J180 Bronchopneumonia, unspecified organism: Secondary | ICD-10-CM | POA: Diagnosis present

## 2016-12-13 DIAGNOSIS — J189 Pneumonia, unspecified organism: Secondary | ICD-10-CM | POA: Diagnosis present

## 2016-12-13 DIAGNOSIS — Z981 Arthrodesis status: Secondary | ICD-10-CM | POA: Diagnosis not present

## 2016-12-13 DIAGNOSIS — Z79899 Other long term (current) drug therapy: Secondary | ICD-10-CM | POA: Diagnosis not present

## 2016-12-13 DIAGNOSIS — K592 Neurogenic bowel, not elsewhere classified: Secondary | ICD-10-CM | POA: Diagnosis present

## 2016-12-13 LAB — CBC
HCT: 36.1 % — ABNORMAL LOW (ref 39.0–52.0)
Hemoglobin: 12.1 g/dL — ABNORMAL LOW (ref 13.0–17.0)
MCH: 28.9 pg (ref 26.0–34.0)
MCHC: 33.5 g/dL (ref 30.0–36.0)
MCV: 86.2 fL (ref 78.0–100.0)
PLATELETS: 306 10*3/uL (ref 150–400)
RBC: 4.19 MIL/uL — ABNORMAL LOW (ref 4.22–5.81)
RDW: 13.5 % (ref 11.5–15.5)
WBC: 23 10*3/uL — ABNORMAL HIGH (ref 4.0–10.5)

## 2016-12-13 LAB — BASIC METABOLIC PANEL
Anion gap: 11 (ref 5–15)
BUN: 8 mg/dL (ref 6–20)
CALCIUM: 8.4 mg/dL — AB (ref 8.9–10.3)
CO2: 23 mmol/L (ref 22–32)
CREATININE: 0.49 mg/dL — AB (ref 0.61–1.24)
Chloride: 106 mmol/L (ref 101–111)
GFR calc Af Amer: >60 mL/min (ref >60)
GLUCOSE: 94 mg/dL (ref 65–99)
Potassium: 3.7 mmol/L (ref 3.5–5.1)
SODIUM: 140 mmol/L (ref 135–145)

## 2016-12-13 LAB — EXPECTORATED SPUTUM ASSESSMENT W GRAM STAIN, RFLX TO RESP C

## 2016-12-13 LAB — STREP PNEUMONIAE URINARY ANTIGEN: STREP PNEUMO URINARY ANTIGEN: NEGATIVE

## 2016-12-13 LAB — EXPECTORATED SPUTUM ASSESSMENT W REFEX TO RESP CULTURE

## 2016-12-13 MED ORDER — ZOLPIDEM TARTRATE 5 MG PO TABS
5.0000 mg | ORAL_TABLET | Freq: Every evening | ORAL | Status: DC | PRN
  Administered 2016-12-13: 5 mg via ORAL
  Filled 2016-12-13: qty 1

## 2016-12-13 MED ORDER — IPRATROPIUM-ALBUTEROL 0.5-2.5 (3) MG/3ML IN SOLN: 3.0000 mL | Freq: Four times a day (QID) | RESPIRATORY_TRACT | Status: DC

## 2016-12-13 MED ORDER — SODIUM CHLORIDE 0.9 % IV SOLN
INTRAVENOUS | Status: DC
  Administered 2016-12-13 – 2016-12-16 (×5): via INTRAVENOUS

## 2016-12-13 MED ORDER — LEVALBUTEROL HCL 0.63 MG/3ML IN NEBU
0.6300 mg | INHALATION_SOLUTION | Freq: Four times a day (QID) | RESPIRATORY_TRACT | Status: DC
  Administered 2016-12-13 – 2016-12-16 (×13): 0.63 mg via RESPIRATORY_TRACT
  Filled 2016-12-13 (×13): qty 3

## 2016-12-13 MED ORDER — GUAIFENESIN ER 600 MG PO TB12
1200.0000 mg | ORAL_TABLET | Freq: Two times a day (BID) | ORAL | Status: DC
  Administered 2016-12-13 – 2016-12-16 (×7): 1200 mg via ORAL
  Filled 2016-12-13 (×8): qty 2

## 2016-12-13 MED ORDER — BUDESONIDE 0.25 MG/2ML IN SUSP
0.2500 mg | Freq: Two times a day (BID) | RESPIRATORY_TRACT | Status: DC
  Administered 2016-12-13 – 2016-12-16 (×6): 0.25 mg via RESPIRATORY_TRACT
  Filled 2016-12-13 (×6): qty 2

## 2016-12-13 MED ORDER — LIP MEDEX EX OINT
TOPICAL_OINTMENT | CUTANEOUS | Status: AC
  Filled 2016-12-13: qty 7

## 2016-12-13 MED ORDER — LEVALBUTEROL HCL 0.63 MG/3ML IN NEBU
0.6300 mg | INHALATION_SOLUTION | RESPIRATORY_TRACT | Status: DC | PRN
Start: 2016-12-13 — End: 2016-12-16

## 2016-12-13 MED ORDER — DEXTROSE 5 % IV SOLN
500.0000 mg | INTRAVENOUS | Status: DC
  Administered 2016-12-14 – 2016-12-15 (×3): 500 mg via INTRAVENOUS
  Filled 2016-12-13 (×3): qty 500

## 2016-12-13 MED ORDER — SODIUM CHLORIDE 3 % IN NEBU
4.0000 mL | INHALATION_SOLUTION | Freq: Every day | RESPIRATORY_TRACT | Status: AC
  Administered 2016-12-14 – 2016-12-15 (×2): 4 mL via RESPIRATORY_TRACT
  Filled 2016-12-13 (×3): qty 4

## 2016-12-13 MED ORDER — PIPERACILLIN-TAZOBACTAM 3.375 G IVPB
3.3750 g | Freq: Three times a day (TID) | INTRAVENOUS | Status: DC
  Administered 2016-12-13 – 2016-12-16 (×8): 3.375 g via INTRAVENOUS
  Filled 2016-12-13 (×9): qty 50

## 2016-12-13 NOTE — Progress Notes (Signed)
PT Cancellation Note  Patient Details Name: Noralee CharsLeo M Atkin MRN: [id number] DOB: 06/09/1986   Cancelled Treatment:    Reason Eval/Treat Not Completed: Patient not medically ready Per RN,on VM, PTA total care, lift OOB to power chair. Check back at another time.)   Sharen HeckHill, Kwanza Cancelliere Elizabeth Amilah Greenspan PT [phone]  12/13/2016, 9:57 AM

## 2016-12-13 NOTE — Progress Notes (Signed)
Pharmacy Antibiotic Note  Curtis Mann is a 31 y.o. male admitted on 12/12/2016 with CAP.  PMH is significant for spastic quadruparesis with indwelling foley catheter.  He was initially started on ceftriaxone and azithromycin, but is now requiring broader spectrum antibiotic coverage for ongoing fever, leukocytosis, congestion, inability to clear secretions.  Pharmacy has been consulted to change from ceftriaxone to Zosyn dosing.  Plan: Continue azithromycin 500mg  IV q24h Zosyn 3.375g IV Q8H infused over 4hrs.    Height: 6' (182.9 cm) Weight: 135 lb (61.2 kg) IBW/kg (Calculated) : 77.6  Temp (24hrs), Avg:100.6 F (38.1 C), Min:99.1 F (37.3 C), Max:101.8 F (38.8 C)   Recent Labs Lab 12/12/16 1654 12/12/16 2138 12/13/16 0425  WBC 24.8*  --  23.0*  CREATININE 0.51*  --  0.49*  LATICACIDVEN  --  0.60  --     Estimated Creatinine Clearance: 116.9 mL/min (by C-G formula based on SCr of 0.49 mg/dL (L)).    No Known Allergies  Antimicrobials this admission: 1/1 Ceftriaxone >> 1/2 1/1 Azithromycin >>  1/2 Zosyn >>  Dose adjustments this admission:  Microbiology results: 1/1 BCx: ngtd 1/1 Influenza A PCR: negative 1/2 Sputum: sent  1/2 Strep pneumo UAg: negative  Thank you for allowing pharmacy to be a part of this patient's care.  Lynann Beaverhristine Kohei Antonellis PharmD, BCPS Pager [phone] 12/13/2016 7:06 PM

## 2016-12-13 NOTE — Progress Notes (Addendum)
RN called RT about patients oxygen level being down to 86%. RT placed patient on 45% venti mask and sats are holding at 92%. RT will continue to monitor.

## 2016-12-13 NOTE — Progress Notes (Signed)
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 CT chest concerning for developing right lower lobe bronchopneumonia. Patient initially placed on Rocephin and azithromycin. Patient unable to mobilize secretions as well assessed chest PT vest was ordered. Antibiotics haven't brought into IV Zosyn and azithromycin.   Assessment & Plan:   Principal Problem:   Community acquired pneumonia of right lower lobe of lung (HCC) Active Problems:   Spastic quadriparesis (HCC)   CAP (community acquired pneumonia)   Leukocytosis   Quadriplegia (HCC)  #1 right lower lobe probably acquired pneumonia Per CT angiogram chest that showed developing bronchopneumonia in the right lower lobe. Patient presented with shortness of breath, chills, cough productive of thick sputum, rhonchorous on physical exam with fair air movement. Patient with a spastic quadriparesis and a such difficult for patient to mobilize his secretions. Patient at increased risk for deteriorating. Patient has been pancultured cultures pending. Continue Mucinex. Chest PT vest has been ordered however none available at this time. Hypertonic saline nebulizers will be added to patient's regimen. Continue Mucinex. Place on Pulmicort, scheduled Xopenex. Continue azithromycin. Will broaden antibiotic coverage and start patient on Zosyn. Discontinue Rocephin. Follow.  #2 leukocytosis Likely secondary to problem #1. Patient has been pancultured. Will discontinue IV Rocephin and broaden antibiotic coverage to IV Zosyn. Continue azithromycin. Follow.  #3 spastic quadriparesis Continue baclofen.   DVT  prophylaxis: Lovenox Code Status: Full Family Communication: Updated patient and wife at bedside. Disposition Plan: Home when medically stable with improvement in respiratory status and pneumonia.   Consultants:   None  Procedures:   CT chest 12/12/2016  CT neck 12/12/2016  CXR 12/12/2016  Antimicrobials:   IV Rocephin 12/12/2016>>>> 12/13/2016  IV azithromycin 12/12/2016  IV Zosyn 12/13/2016   Subjective: Patient still complaining of shortness of breath. Patient states hasn't slept in 3 days due to worsening respiratory status.  Objective: Vitals:   12/13/16 1343 12/13/16 1434 12/13/16 1955 12/13/16 2137  BP: (!) 94/47 (!) 97/48  (!) 105/49  Pulse: (!) 103 (!) 106  89  Resp:    16  Temp: (!) 101.8 F (38.8 C) (!) 100.6 F (38.1 C)  99.5 F (37.5 C)  TempSrc: Oral Axillary  Oral  SpO2: 100% 98% 96% 92%  Weight:      Height:        Intake/Output Summary (Last 24 hours) at 12/13/16 2154 Last data filed at 12/13/16 2141  Gross per 24 hour  Intake             2840 ml  Output             2830 ml  Net               10 ml   Filed Weights   12/12/16 1627 12/12/16 2341  Weight: 61.2 kg (135 lb) 61.2 kg (135 lb)    Examination:  General exam: Appears calm and comfortable.  Respiratory system: Rhonchorus diffuse BS. Respiratory effort normal. Cardiovascular system: S1 & S2 heard, RRR. No JVD, murmurs, rubs, gallops or clicks. No pedal edema. Gastrointestinal system: Abdomen is nondistended, soft and nontender. No organomegaly or masses felt. Normal bowel sounds heard. Central nervous system:  Alert and oriented. No focal neurological deficits. Extremities: Symmetric 5 x 5 power. Skin: No rashes, lesions or ulcers Psychiatry: Judgement and insight appear normal. Mood & affect appropriate.     Data Reviewed: I have personally reviewed following labs and imaging studies  CBC:  Recent Labs Lab 12/12/16 1654 12/13/16 0425  WBC 24.8* 23.0*  NEUTROABS 19.1*   --   HGB 13.5 12.1*  HCT 39.4 36.1*  MCV 85.5 86.2  PLT 316 306   Basic Metabolic Panel:  Recent Labs Lab 12/12/16 1654 12/13/16 0425  NA 136 140  K 3.7 3.7  CL 102 106  CO2 22 23  GLUCOSE 112* 94  BUN 12 8  CREATININE 0.51* 0.49*  CALCIUM 8.8* 8.4*   GFR: Estimated Creatinine Clearance: 116.9 mL/min (by C-G formula based on SCr of 0.49 mg/dL (L)). Liver Function Tests: No results for input(s): AST, ALT, ALKPHOS, BILITOT, PROT, ALBUMIN in the last 168 hours. No results for input(s): LIPASE, AMYLASE in the last 168 hours. No results for input(s): AMMONIA in the last 168 hours. Coagulation Profile: No results for input(s): INR, PROTIME in the last 168 hours. Cardiac Enzymes: No results for input(s): CKTOTAL, CKMB, CKMBINDEX, TROPONINI in the last 168 hours. BNP (last 3 results) No results for input(s): PROBNP in the last 8760 hours. HbA1C: No results for input(s): HGBA1C in the last 72 hours. CBG: No results for input(s): GLUCAP in the last 168 hours. Lipid Profile: No results for input(s): CHOL, HDL, LDLCALC, TRIG, CHOLHDL, LDLDIRECT in the last 72 hours. Thyroid Function Tests: No results for input(s): TSH, T4TOTAL, FREET4, T3FREE, THYROIDAB in the last 72 hours. Anemia Panel: No results for input(s): VITAMINB12, FOLATE, FERRITIN, TIBC, IRON, RETICCTPCT in the last 72 hours. Sepsis Labs:  Recent Labs Lab 12/12/16 2138  LATICACIDVEN 0.60    Recent Results (from the past 240 hour(s))  Blood culture (routine x 2)     Status: None (Preliminary result)   Collection Time: 12/12/16  9:26 PM  Result Value Ref Range Status   Specimen Description BLOOD RIGHT FOREARM  Final   Special Requests BOTTLES DRAWN AEROBIC AND ANAEROBIC  Final   Culture   Final    NO GROWTH < 12 HOURS Performed at [facility]    Report Status PENDING  Incomplete  Blood culture (routine x 2)     Status: None (Preliminary result)   Collection Time: 12/12/16  9:26 PM  Result Value  Ref Range Status   Specimen Description BLOOD LEFT ANTECUBITAL  Final   Special Requests BOTTLES DRAWN AEROBIC AND ANAEROBIC  Final   Culture   Final    NO GROWTH < 12 HOURS Performed at [facility]    Report Status PENDING  Incomplete  Culture, sputum-assessment     Status: None   Collection Time: 12/13/16  2:30 AM  Result Value Ref Range Status   Specimen Description SPUTUM  Final   Special Requests NONE  Final   Sputum evaluation THIS SPECIMEN IS ACCEPTABLE FOR SPUTUM CULTURE  Final   Report Status 12/13/2016 FINAL  Final         Radiology Studies: Dg Chest 2 View  Result Date: 12/12/2016 CLINICAL DATA:  Left chest pain since yesterday EXAM: CHEST  2 VIEW COMPARISON:  August 06, 2013 FINDINGS: The heart size and mediastinal contours are within normal limits. There is no focal infiltrate, pulmonary edema, or pleural effusion. The visualized skeletal structures are unremarkable. IMPRESSION: No active cardiopulmonary disease. Electronically Signed  By: Sherian Rein M.D.   On: 12/12/2016 17:03   Ct Soft Tissue Neck W Contrast  Result Date: 12/12/2016 CLINICAL DATA:  Shortness of breath and chest discomfort beginning last night. Difficulty breathing. Quadriplegic since 2009. EXAM: CT NECK WITH CONTRAST TECHNIQUE: Multidetector CT imaging of the neck was performed using the standard protocol following the bolus administration of intravenous contrast. CONTRAST:  75mL ISOVUE-300 IOPAMIDOL (ISOVUE-300) INJECTION 61% COMPARISON:  10/26/2016 FINDINGS: Pharynx and larynx: No mucosal or submucosal lesion. Salivary glands: Submandibular and parotid glands are normal. Thyroid: Normal Lymph nodes: No enlarged or low-density nodes on either side of the neck. Vascular: Normal Limited intracranial: Normal Visualized orbits: Normal Mastoids and visualized paranasal sinuses: Clear Skeleton: Previous cervical fusion C3 through T1. Old fracture at C5. Upper chest: See results of chest CT. Other:  None IMPRESSION: No acute or significant finding in the neck. Soft tissues are unremarkable. Old C5 fracture with fusion from C3 through T1. Electronically Signed   By: Paulina Fusi M.D.   On: 12/12/2016 20:13   Ct Chest W Contrast  Result Date: 12/12/2016 CLINICAL DATA:  Quadriplegia. Shortness of breath and chest discomfort. EXAM: CT CHEST WITH CONTRAST TECHNIQUE: Multidetector CT imaging of the chest was performed during intravenous contrast administration. CONTRAST:  75mL ISOVUE-300 IOPAMIDOL (ISOVUE-300) INJECTION 61% COMPARISON:  Radiography same day FINDINGS: Cardiovascular: Heart size is normal. Systemic arterial structures are normal. No pulmonary arterial abnormality seen. Mediastinum/Nodes: No mass or lymphadenopathy. Lungs/Pleura: Mucous layering in the dependent trachea. Bronchial tree on the left is patent and the left lung is clear. Bronchial tree on the right shows filling of the bronchial airways in the right lower lobe with developing alveolar infiltrates consistent with bronchopneumonia. No pleural effusion on either side. Upper Abdomen: Normal Musculoskeletal: No abnormality in the bony thorax. IMPRESSION: Opacification of the right lower lobe bronchi with developing alveolar infiltrates in the right lower lobe consistent with right lower lobe bronchopneumonia. Electronically Signed   By: Paulina Fusi M.D.   On: 12/12/2016 20:17        Scheduled Meds: . azithromycin  250 mg Oral Q24H  . baclofen  20 mg Oral TID  . budesonide (PULMICORT) nebulizer solution  0.25 mg Nebulization BID  . enoxaparin (LOVENOX) injection  40 mg Subcutaneous QHS  . guaiFENesin  1,200 mg Oral BID  . levalbuterol  0.63 mg Nebulization Q6H  . lip balm      . piperacillin-tazobactam (ZOSYN)  IV  3.375 g Intravenous Q8H  . sodium chloride HYPERTONIC  4 mL Nebulization Daily   Continuous Infusions: . sodium chloride 100 mL/hr at 12/13/16 2045     LOS: 0 days    Time spent: 40  mins    Habeeb Puertas, MD [facility] Pager [phone]  If 7PM-7AM, please contact night-coverage [email or web address] Password [facility] 12/13/2016, 9:54 PM

## 2016-12-14 LAB — CBC WITH DIFFERENTIAL/PLATELET
BASOS ABS: 0 10*3/uL (ref 0.0–0.1)
BASOS PCT: 0 %
Eosinophils Absolute: 0.1 10*3/uL (ref 0.0–0.7)
Eosinophils Relative: 0 %
HEMATOCRIT: 35.5 % — AB (ref 39.0–52.0)
Hemoglobin: 12 g/dL — ABNORMAL LOW (ref 13.0–17.0)
Lymphocytes Relative: 11 %
Lymphs Abs: 2.5 10*3/uL (ref 0.7–4.0)
MCH: 29.3 pg (ref 26.0–34.0)
MCHC: 33.8 g/dL (ref 30.0–36.0)
MCV: 86.6 fL (ref 78.0–100.0)
MONO ABS: 2.3 10*3/uL — AB (ref 0.1–1.0)
Monocytes Relative: 11 %
NEUTROS ABS: 16.8 10*3/uL — AB (ref 1.7–7.7)
NEUTROS PCT: 78 %
Platelets: 264 10*3/uL (ref 150–400)
RBC: 4.1 MIL/uL — ABNORMAL LOW (ref 4.22–5.81)
RDW: 13.5 % (ref 11.5–15.5)
WBC: 21.6 10*3/uL — ABNORMAL HIGH (ref 4.0–10.5)

## 2016-12-14 LAB — MAGNESIUM: MAGNESIUM: 2.1 mg/dL (ref 1.7–2.4)

## 2016-12-14 LAB — BASIC METABOLIC PANEL
ANION GAP: 8 (ref 5–15)
BUN: <5 mg/dL — ABNORMAL LOW (ref 6–20)
CALCIUM: 8.2 mg/dL — AB (ref 8.9–10.3)
CO2: 25 mmol/L (ref 22–32)
Chloride: 106 mmol/L (ref 101–111)
Creatinine, Ser: 0.47 mg/dL — ABNORMAL LOW (ref 0.61–1.24)
Glucose, Bld: 104 mg/dL — ABNORMAL HIGH (ref 65–99)
Potassium: 3.5 mmol/L (ref 3.5–5.1)
SODIUM: 139 mmol/L (ref 135–145)

## 2016-12-14 MED ORDER — PSYLLIUM 95 % PO PACK
1.0000 | PACK | Freq: Every day | ORAL | Status: DC | PRN
  Filled 2016-12-14: qty 1

## 2016-12-14 MED ORDER — DIPHENHYDRAMINE HCL 25 MG PO CAPS
25.0000 mg | ORAL_CAPSULE | Freq: Every evening | ORAL | Status: DC | PRN
  Administered 2016-12-14: 25 mg via ORAL
  Filled 2016-12-14: qty 1

## 2016-12-14 NOTE — Evaluation (Signed)
Occupational Therapy Evaluation Patient Details Name: Curtis Mann MRN: [id number] DOB: 07-Sep-1986 Today's Date: 12/14/2016    History of Present Illness pt was admitted for CAP.  He has a h/o quadriplegia since '09   Clinical Impression   This 31 year old man was admitted for the above.  Seen for initial evaluation and provided HEP/universal cuff.  No further OT is needed in acute setting    Follow Up Recommendations  No OT follow up    Equipment Recommendations  Other (comment) (provided universal cuff for RUE)    Recommendations for Other Services       Precautions / Restrictions Precautions Precautions:  (uses lateral pinch; L more functional (tenodesis)) Restrictions Weight Bearing Restrictions: No      Mobility Bed Mobility                total A at baseline  Transfers                  total A at baseline    Balance                                            ADL Overall ADL's : At baseline    set up for self feeding and performs some grooming with assistance                                         Vision     Perception     Praxis      Pertinent Vitals/Pain Pain Assessment: No/denies pain     Hand Dominance Left (better strength)   Extremity/Trunk Assessment Upper Extremity Assessment Upper Extremity Assessment: RUE deficits/detail;LUE deficits/detail RUE Deficits / Details: pt feels like he is not at his normal strength.  He is able to [facility] (sc[facility]  This hand drops things.  provided universal cuff for him: it is medium sized, but takes some effort to put on (non-adjustable, elastic band)  He has had this in the past and is interested in having this to help him with combing hair.  He has to recruit biceps when lifting above 90 degrees.  Biceps strength 3+/5 LUE Deficits / Details: grossly 3+/5 strength.  Tight in end ranges of bil shoulders in flexion and abduction.              Communication Communication Communication: No difficulties   Cognition Arousal/Alertness: Awake/alert Behavior During Therapy: WFL for tasks assessed/performed Overall Cognitive Status: Within Functional Limits for tasks assessed                     General Comments       Exercises Exercises: Other exercises Other Exercises Other Exercises: assessed strength and encouraged pt to perform AROM for strengthening.  Wife present and feels comfortable with helping him strength bil shoulders.  She helps with Legs at baseline Other Exercises: provided level one theraband for biceps strengthening.  tied to bedrails.  encouraged loop at wrist so tenodesis would not be compromised:  difficult for RUE due to IV/sat monitor.  At home, this could be tied onto w/c or bedside chair.  Pt currently has a hospital bed but he is not sure how long he will have it.  Shoulder Instructions      Home Living Family/patient expects to be discharged to:: Private residence Living Arrangements: Spouse/significant other;Children Available Help at Discharge: Family                                    Prior Functioning/Environment Level of Independence: Needs assistance        Comments: pt has assist for adls; feeds himself after set up and does some grooming        OT Problem List:     OT Treatment/Interventions:      OT Goals(Current goals can be found in the care plan section) Acute Rehab OT Goals Patient Stated Goal: get strength back  OT Frequency:     Barriers to D/C:            Co-evaluation              End of Session    Activity Tolerance: Patient tolerated treatment well Patient left: in bed;with call bell/phone within reach   Time: 4034-7425 and 1610-1615 OT Time Calculation (min): 23 min Charges:  OT General Charges $OT Visit: 1 Procedure OT Evaluation $OT Eval Low Complexity: 1 Procedure G-Codes:    Jaliana Medellin Dec 28, 2016,  4:27 PM Marica Otter, OTR/L [phone] 12-28-2016

## 2016-12-14 NOTE — Care Management Note (Signed)
Case Management Note  Patient Details  Name: Curtis Mann MRN: [id number] Date of Birth: 01/13/1986  Subjective/Objective:  31 y.o. M admitted 12/12/2016 for RLL CAP. Pt has hx Spastic Quadriplegia and uses [employer]. Lives in Private Residence with Spouse.                   Action/Plan:  Anticipate discharge home in 2-3 d. No further CM needs but will be available should additional discharge needs arise.   Expected Discharge Date:  12/13/16               Expected Discharge Plan:  Home/Self Care  In-House Referral:  NA  Discharge planning Services  CM Consult  Post Acute Care Choice:   (has DME) Choice offered to:  Patient  DME Arranged:    DME Agency:     HH Arranged:    HH Agency:     Status of Service:  In process, will continue to follow  If discussed at Long Length of Stay Meetings, dates discussed:    Additional Comments:  Yvone NeuCrutchfield, Navjot Pilgrim M, RN 12/14/2016, 1:20 PM

## 2016-12-14 NOTE — Progress Notes (Signed)
[facility] PROGRESS NOTE    Progress Note  Curtis Mann  ZOX:[id number] DOB: 09/07/1986 DOA: 12/12/2016 PCP: Florentina JennyRIPP, HENRY, MD     Brief Narrative:   Curtis Mann is an 31 y.o. 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 CT chest concerning for developing right lower lobe bronchopneumonia  Assessment/Plan:   Community-acquired pneumonia: CT-developing bronchopneumonia. Cultures have remained negative till date. Continue IV Zosyn and azithromycin. Leukocytosis is slowly improving. Has remained afebrile in last 24 hours.  spastic quadriparesis Continue baclofen.  DVT prophylaxis: Lovenox Family Communication:none Disposition Plan/Barrier to D/C: home in 2-3 day Code Status:     Code Status Orders        Start     Ordered   12/12/16 2315  Full code  Continuous     12/12/16 2314    Code Status History    Date Active Date Inactive Code Status Order ID Comments User Context   This patient has a current code status but no historical code status.        IV Access:    Peripheral IV   Procedures and diagnostic studies:   Dg Chest 2 View  Result Date: 12/12/2016 CLINICAL DATA:  Left chest pain since yesterday EXAM: CHEST  2 VIEW COMPARISON:  August 06, 2013 FINDINGS: The heart size and mediastinal contours are within normal limits. There is no focal infiltrate, pulmonary edema, or pleural effusion. The visualized skeletal structures are unremarkable. IMPRESSION: No active cardiopulmonary disease. Electronically Signed   By: Sherian ReinWei-Chen  Lin M.D.   On: 12/12/2016 17:03   Ct Soft Tissue Neck W Contrast  Result Date: 12/12/2016 CLINICAL DATA:  Shortness of breath and chest discomfort beginning last night. Difficulty breathing. Quadriplegic since 2009. EXAM: CT NECK WITH  CONTRAST TECHNIQUE: Multidetector CT imaging of the neck was performed using the standard protocol following the bolus administration of intravenous contrast. CONTRAST:  75mL ISOVUE-300 IOPAMIDOL (ISOVUE-300) INJECTION 61% COMPARISON:  10/26/2016 FINDINGS: Pharynx and larynx: No mucosal or submucosal lesion. Salivary glands: Submandibular and parotid glands are normal. Thyroid: Normal Lymph nodes: No enlarged or low-density nodes on either side of the neck. Vascular: Normal Limited intracranial: Normal Visualized orbits: Normal Mastoids and visualized paranasal sinuses: Clear Skeleton: Previous cervical fusion C3 through T1. Old fracture at C5. Upper chest: See results of chest CT. Other: None IMPRESSION: No acute or significant finding in the neck. Soft tissues are unremarkable. Old C5 fracture with fusion from C3 through T1. Electronically Signed   By: Paulina FusiMark  Shogry M.D.   On: 12/12/2016 20:13   Ct Chest W Contrast  Result Date: 12/12/2016 CLINICAL DATA:  Quadriplegia. Shortness of breath and chest discomfort. EXAM: CT CHEST WITH CONTRAST TECHNIQUE: Multidetector CT imaging of the chest was performed during intravenous contrast administration. CONTRAST:  75mL ISOVUE-300 IOPAMIDOL (ISOVUE-300) INJECTION 61% COMPARISON:  Radiography same day FINDINGS: Cardiovascular: Heart size is normal. Systemic arterial structures are normal. No pulmonary arterial abnormality seen. Mediastinum/Nodes: No mass or lymphadenopathy. Lungs/Pleura: Mucous layering in the dependent trachea. Bronchial tree on the left is patent and the left lung is clear. Bronchial tree on the right shows filling of the bronchial airways in the right lower lobe with developing alveolar infiltrates consistent with bronchopneumonia. No pleural effusion on either side. Upper Abdomen: Normal Musculoskeletal: No abnormality in the bony thorax. IMPRESSION: Opacification of  the right lower lobe bronchi with developing alveolar infiltrates in the right lower  lobe consistent with right lower lobe bronchopneumonia. Electronically Signed   By: Paulina Fusi M.D.   On: 12/12/2016 20:17     Medical Consultants:    None.  Anti-Infectives:   Zosyn and azithro  Subjective:    Curtis Mann he relates he feels better.  Objective:    Vitals:   12/14/16 0546 12/14/16 0758 12/14/16 0801 12/14/16 0803  BP: 119/65     Pulse: 76  80   Resp: 16  16   Temp: 99.9 F (37.7 C)     TempSrc: Oral     SpO2: 98% 100% 100% 100%  Weight:      Height:        Intake/Output Summary (Last 24 hours) at 12/14/16 1222 Last data filed at 12/14/16 1100  Gross per 24 hour  Intake             3225 ml  Output             3875 ml  Net             -650 ml   Filed Weights   12/12/16 1627 12/12/16 2341  Weight: 61.2 kg (135 lb) 61.2 kg (135 lb)    Exam: General exam: In no acute distress. Respiratory system: Good air movement and clear to auscultation. Cardiovascular system: S1 & S2 heard, RRR.  Gastrointestinal system: Abdomen is nondistended, soft and nontender.  Extremities: No pedal edema. Skin: No rashes, lesions or ulcers Psychiatry: Judgement and insight appear normal. Mood & affect appropriate.    Data Reviewed:    Labs: Basic Metabolic Panel:  Recent Labs Lab 12/12/16 1654 12/13/16 0425 12/14/16 0454  NA 136 140 139  K 3.7 3.7 3.5  CL 102 106 106  CO2 22 23 25   GLUCOSE 112* 94 104*  BUN 12 8 <5*  CREATININE 0.51* 0.49* 0.47*  CALCIUM 8.8* 8.4* 8.2*  MG  --   --  2.1   GFR Estimated Creatinine Clearance: 116.9 mL/min (by C-G formula based on SCr of 0.47 mg/dL (L)). Liver Function Tests: No results for input(s): AST, ALT, ALKPHOS, BILITOT, PROT, ALBUMIN in the last 168 hours. No results for input(s): LIPASE, AMYLASE in the last 168 hours. No results for input(s): AMMONIA in the last 168 hours. Coagulation profile No results for input(s): INR, PROTIME in the last 168 hours.  CBC:  Recent Labs Lab 12/12/16 1654  12/13/16 0425 12/14/16 0454  WBC 24.8* 23.0* 21.6*  NEUTROABS 19.1*  --  16.8*  HGB 13.5 12.1* 12.0*  HCT 39.4 36.1* 35.5*  MCV 85.5 86.2 86.6  PLT 316 306 264   Cardiac Enzymes: No results for input(s): CKTOTAL, CKMB, CKMBINDEX, TROPONINI in the last 168 hours. BNP (last 3 results) No results for input(s): PROBNP in the last 8760 hours. CBG: No results for input(s): GLUCAP in the last 168 hours. D-Dimer: No results for input(s): DDIMER in the last 72 hours. Hgb A1c: No results for input(s): HGBA1C in the last 72 hours. Lipid Profile: No results for input(s): CHOL, HDL, LDLCALC, TRIG, CHOLHDL, LDLDIRECT in the last 72 hours. Thyroid function studies: No results for input(s): TSH, T4TOTAL, T3FREE, THYROIDAB in the last 72 hours.  Invalid input(s): FREET3 Anemia work up: No results for input(s): VITAMINB12, FOLATE, FERRITIN, TIBC, IRON, RETICCTPCT in the last 72 hours. Sepsis Labs:  Recent Labs Lab 12/12/16 1654 12/12/16 2138 12/13/16 0425 12/14/16 0454  WBC  24.8*  --  23.0* 21.6*  LATICACIDVEN  --  0.60  --   --    Microbiology Recent Results (from the past 240 hour(s))  Blood culture (routine x 2)     Status: None (Preliminary result)   Collection Time: 12/12/16  9:26 PM  Result Value Ref Range Status   Specimen Description BLOOD RIGHT FOREARM  Final   Special Requests BOTTLES DRAWN AEROBIC AND ANAEROBIC  Final   Culture   Final    NO GROWTH < 12 HOURS Performed at [facility]    Report Status PENDING  Incomplete  Blood culture (routine x 2)     Status: None (Preliminary result)   Collection Time: 12/12/16  9:26 PM  Result Value Ref Range Status   Specimen Description BLOOD LEFT ANTECUBITAL  Final   Special Requests BOTTLES DRAWN AEROBIC AND ANAEROBIC  Final   Culture   Final    NO GROWTH < 12 HOURS Performed at [facility]    Report Status PENDING  Incomplete  Culture, sputum-assessment     Status: None   Collection Time: 12/13/16   2:30 AM  Result Value Ref Range Status   Specimen Description SPUTUM  Final   Special Requests NONE  Final   Sputum evaluation THIS SPECIMEN IS ACCEPTABLE FOR SPUTUM CULTURE  Final   Report Status 12/13/2016 FINAL  Final     Medications:   . azithromycin  500 mg Intravenous Q24H  . baclofen  20 mg Oral TID  . budesonide (PULMICORT) nebulizer solution  0.25 mg Nebulization BID  . enoxaparin (LOVENOX) injection  40 mg Subcutaneous QHS  . guaiFENesin  1,200 mg Oral BID  . levalbuterol  0.63 mg Nebulization Q6H  . piperacillin-tazobactam (ZOSYN)  IV  3.375 g Intravenous Q8H  . sodium chloride HYPERTONIC  4 mL Nebulization Daily   Continuous Infusions: . sodium chloride 100 mL/hr at 12/13/16 2045    Time spent:    LOS: 1 day   Marinda Elk  [facility] Pager [phone]  *Please refer to [email or web address], password [email or web address] to get updated schedule on who will round on this patient, as hospitalists switch teams weekly. If 7PM-7AM, please contact night-coverage at [email or web address], password [email or web address] for any overnight needs.  12/14/2016, 12:22 PM

## 2016-12-14 NOTE — Progress Notes (Signed)
PT Cancellation Note  Patient Details Name: Noralee CharsLeo M Mascaro MRN: [id number] DOB: 1986-06-19   Cancelled Treatment:    Reason Eval/Treat Not Completed: PT screened, no needs identified, will sign off   Rada HayHill, Lillianna Sabel Elizabeth 12/14/2016, 1:23 PM Blanchard KelchKaren Kaidon Kinker PT [phone]

## 2016-12-15 LAB — CBC
HCT: 37.2 % — ABNORMAL LOW (ref 39.0–52.0)
Hemoglobin: 12.6 g/dL — ABNORMAL LOW (ref 13.0–17.0)
MCH: 28.7 pg (ref 26.0–34.0)
MCHC: 33.9 g/dL (ref 30.0–36.0)
MCV: 84.7 fL (ref 78.0–100.0)
PLATELETS: 327 10*3/uL (ref 150–400)
RBC: 4.39 MIL/uL (ref 4.22–5.81)
RDW: 13 % (ref 11.5–15.5)
WBC: 12.5 10*3/uL — ABNORMAL HIGH (ref 4.0–10.5)

## 2016-12-15 LAB — CULTURE, RESPIRATORY W GRAM STAIN: Culture: NORMAL

## 2016-12-15 LAB — CULTURE, RESPIRATORY

## 2016-12-15 NOTE — Progress Notes (Signed)
Pt refused cpt/vest at this time.

## 2016-12-15 NOTE — Progress Notes (Signed)
[facility] PROGRESS NOTE    Progress Note  Curtis Mann  WUJ:[id number] DOB: 1986/03/17 DOA: 12/12/2016 PCP: Curtis Jenny, MD     Brief Narrative:   Curtis Mann is an 31 y.o. 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 CT chest concerning for developing right lower lobe bronchopneumonia  Assessment/Plan:   Community-acquired pneumonia: CT-developing bronchopneumonia. Cultures have remained negative till date. Continue IV Zosyn and azithromycin. CBC pending. Has remained afebrile in last 48 hours.  spastic quadriparesis Continue baclofen.  DVT prophylaxis: Lovenox Family Communication:none Disposition Plan/Barrier to D/C: home in 2 day Code Status:     Code Status Orders        Start     Ordered   12/12/16 2315  Full code  Continuous     12/12/16 2314    Code Status History    Date Active Date Inactive Code Status Order ID Comments User Context   This patient has a current code status but no historical code status.        IV Access:    Peripheral IV   Procedures and diagnostic studies:   No results found.   Medical Consultants:    None.  Anti-Infectives:   Zosyn and azithro  Subjective:    Curtis Mann he relates he feels better.  Objective:    Vitals:   12/15/16 0700 12/15/16 0750 12/15/16 0900 12/15/16 0905  BP: (!) 142/89 138/85    Pulse: 79     Resp: 20     Temp: 98.8 F (37.1 C)     TempSrc: Oral     SpO2: 100%  97% 97%  Weight:      Height:        Intake/Output Summary (Last 24 hours) at 12/15/16 1218 Last data filed at 12/15/16 1037  Gross per 24 hour  Intake             3050 ml  Output             2950 ml  Net              100 ml   Filed Weights   12/12/16 1627 12/12/16 2341  Weight: 61.2 kg (135 lb) 61.2  kg (135 lb)    Exam: General exam: In no acute distress. Respiratory system: Good air movement and clear to auscultation. Cardiovascular system: S1 & S2 heard, RRR.  Gastrointestinal system: Abdomen is nondistended, soft and nontender.  Extremities: No pedal edema. Skin: No rashes, lesions or ulcers Psychiatry: Judgement and insight appear normal. Mood & affect appropriate.    Data Reviewed:    Labs: Basic Metabolic Panel:  Recent Labs Lab 12/12/16 1654 12/13/16 0425 12/14/16 0454  NA 136 140 139  K 3.7 3.7 3.5  CL 102 106 106  CO2 22 23 25   GLUCOSE 112* 94 104*  BUN 12 8 <5*  CREATININE 0.51* 0.49* 0.47*  CALCIUM 8.8* 8.4* 8.2*  MG  --   --  2.1   GFR Estimated Creatinine Clearance: 116.9 mL/min (by C-G formula based on SCr of 0.47 mg/dL (L)). Liver Function Tests: No results for input(s): AST, ALT, ALKPHOS, BILITOT, PROT, ALBUMIN in the last 168 hours. No results for input(s): LIPASE, AMYLASE in the last 168 hours. No results for input(s): AMMONIA in the last 168 hours. Coagulation  profile No results for input(s): INR, PROTIME in the last 168 hours.  CBC:  Recent Labs Lab 12/12/16 1654 12/13/16 0425 12/14/16 0454  WBC 24.8* 23.0* 21.6*  NEUTROABS 19.1*  --  16.8*  HGB 13.5 12.1* 12.0*  HCT 39.4 36.1* 35.5*  MCV 85.5 86.2 86.6  PLT 316 306 264   Cardiac Enzymes: No results for input(s): CKTOTAL, CKMB, CKMBINDEX, TROPONINI in the last 168 hours. BNP (last 3 results) No results for input(s): PROBNP in the last 8760 hours. CBG: No results for input(s): GLUCAP in the last 168 hours. D-Dimer: No results for input(s): DDIMER in the last 72 hours. Hgb A1c: No results for input(s): HGBA1C in the last 72 hours. Lipid Profile: No results for input(s): CHOL, HDL, LDLCALC, TRIG, CHOLHDL, LDLDIRECT in the last 72 hours. Thyroid function studies: No results for input(s): TSH, T4TOTAL, T3FREE, THYROIDAB in the last 72 hours.  Invalid input(s): FREET3 Anemia  work up: No results for input(s): VITAMINB12, FOLATE, FERRITIN, TIBC, IRON, RETICCTPCT in the last 72 hours. Sepsis Labs:  Recent Labs Lab 12/12/16 1654 12/12/16 2138 12/13/16 0425 12/14/16 0454  WBC 24.8*  --  23.0* 21.6*  LATICACIDVEN  --  0.60  --   --    Microbiology Recent Results (from the past 240 hour(s))  Blood culture (routine x 2)     Status: None (Preliminary result)   Collection Time: 12/12/16  9:26 PM  Result Value Ref Range Status   Specimen Description BLOOD RIGHT FOREARM  Final   Special Requests BOTTLES DRAWN AEROBIC AND ANAEROBIC 5ML  Final   Culture   Final    NO GROWTH 1 DAY [facility]    Report Status PENDING  Incomplete  Blood culture (routine x 2)     Status: None (Preliminary result)   Collection Time: 12/12/16  9:26 PM  Result Value Ref Range Status   Specimen Description BLOOD LEFT ANTECUBITAL  Final   Special Requests BOTTLES DRAWN AEROBIC AND ANAEROBIC 5ML  Final   Culture   Final    NO GROWTH 1 DAY Performed at [facility]    Report Status PENDING  Incomplete  Culture, sputum-assessment     Status: None   Collection Time: 12/13/16  2:30 AM  Result Value Ref Range Status   Specimen Description SPUTUM  Final   Special Requests NONE  Final   Sputum evaluation THIS SPECIMEN IS ACCEPTABLE FOR SPUTUM CULTURE  Final   Report Status 12/13/2016 FINAL  Final  Culture, respiratory (NON-Expectorated)     Status: None   Collection Time: 12/13/16  2:30 AM  Result Value Ref Range Status   Specimen Description SPUTUM  Final   Special Requests NONE Reflexed from [id number]  Final   Gram Stain   Final    ABUNDANT WBC PRESENT, PREDOMINANTLY PMN FEW GRAM POSITIVE COCCI IN CLUSTERS RARE GRAM NEGATIVE COCCOBACILLI    Culture   Final    Consistent with normal respiratory flora. Performed at [facility]    Report Status 12/15/2016 FINAL  Final     Medications:   . azithromycin  500 mg Intravenous Q24H  . baclofen   20 mg Oral TID  . budesonide (PULMICORT) nebulizer solution  0.25 mg Nebulization BID  . enoxaparin (LOVENOX) injection  40 mg Subcutaneous QHS  . guaiFENesin  1,200 mg Oral BID  . levalbuterol  0.63 mg Nebulization Q6H  . piperacillin-tazobactam (ZOSYN)  IV  3.375 g Intravenous Q8H  . sodium chloride HYPERTONIC  4 mL Nebulization Daily   Continuous Infusions: . sodium chloride 100 mL/hr at 12/14/16 1806    Time spent:    LOS: 2 days   Marinda Elk  [facility] Pager [phone]  *Please refer to [email or web address], password [email or web address] to get updated schedule on who will round on this patient, as hospitalists switch teams weekly. If 7PM-7AM, please contact night-coverage at [email or web address], password [email or web address] for any overnight needs.  12/15/2016, 12:18 PM

## 2016-12-15 NOTE — Progress Notes (Signed)
Pt unsatisfied with sleep med, Ambien. Inquired to see if substitute or change can be made. Call out to covering M.D. Craige CottaKirby. New order implemented for benadryl see mar. Given per order pt tolerated without difficulty.

## 2016-12-16 MED ORDER — AZITHROMYCIN 500 MG PO TABS: 500.0000 mg | ORAL_TABLET | Freq: Every day | ORAL | 0 refills | Status: DC

## 2016-12-16 MED ORDER — AZITHROMYCIN 500 MG PO TABS
500.0000 mg | ORAL_TABLET | Freq: Every day | ORAL | Status: DC
  Administered 2016-12-16: 500 mg via ORAL
  Filled 2016-12-16: qty 1

## 2016-12-16 NOTE — Discharge Summary (Signed)
Physician Discharge Summary  Curtis Mann [id number] DOB: Jul 19, 1986 DOA: 12/12/2016  PCP: Florentina Jenny, MD  Admit date: 12/12/2016 Discharge date: 12/16/2016  Admitted From: home Disposition:  Home  Recommendations for Outpatient Follow-up:  1. Follow up with PCP in 1-2 weeks  Home Health:no Equipment/Devices:none  Discharge Condition:full CODE STATUS:full Diet recommendation: Heart Healthy  Brief/Interim Summary: 31 y.o. male with a past medical history significant for spastic quadruparesis with indwelling foley who presents with cough and dyspnea  Discharge Diagnoses:  Principal Problem:   Community acquired pneumonia of right lower lobe of lung (HCC) Active Problems:   Spastic quadriparesis (HCC)   CAP (community acquired pneumonia)   Leukocytosis   Quadriplegia (HCC)   Community-acquired pneumonia: CT-developing bronchopneumonia. Cultures have remained negative. Started on IV Zosyn and azithromycin. Defervesce, leukocytosis improved.  change to azithro which he will cont as an outpatient for 2 additional days.  spastic quadriparesis Continue baclofen Discharge Instructions  Discharge Instructions    Diet - low sodium heart healthy    Complete by:  As directed    Increase activity slowly    Complete by:  As directed      Allergies as of 12/16/2016   No Known Allergies     Medication List    TAKE these medications   azithromycin 500 MG tablet Commonly known as:  ZITHROMAX Take 1 tablet (500 mg total) by mouth daily. Start taking on:  12/17/2016   baclofen 20 MG tablet Commonly known as:  LIORESAL Take 20 mg by mouth 3 (three) times daily.   HYDROcodone-acetaminophen 5-325 MG tablet Commonly known as:  NORCO/VICODIN Take 1 tablet by mouth every 6 (six) hours as needed for moderate pain. Pain   multivitamin with minerals Tabs tablet Take 1 tablet by mouth daily.       No Known Allergies  Consultations:  None   Procedures/Studies: Dg Chest  2 View  Result Date: 12/12/2016 CLINICAL DATA:  Left chest pain since yesterday EXAM: CHEST  2 VIEW COMPARISON:  August 06, 2013 FINDINGS: The heart size and mediastinal contours are within normal limits. There is no focal infiltrate, pulmonary edema, or pleural effusion. The visualized skeletal structures are unremarkable. IMPRESSION: No active cardiopulmonary disease. Electronically Signed   By: Sherian Rein M.D.   On: 12/12/2016 17:03   Ct Soft Tissue Neck W Contrast  Result Date: 12/12/2016 CLINICAL DATA:  Shortness of breath and chest discomfort beginning last night. Difficulty breathing. Quadriplegic since 2009. EXAM: CT NECK WITH CONTRAST TECHNIQUE: Multidetector CT imaging of the neck was performed using the standard protocol following the bolus administration of intravenous contrast. CONTRAST:  75mL ISOVUE-300 IOPAMIDOL (ISOVUE-300) INJECTION 61% COMPARISON:  10/26/2016 FINDINGS: Pharynx and larynx: No mucosal or submucosal lesion. Salivary glands: Submandibular and parotid glands are normal. Thyroid: Normal Lymph nodes: No enlarged or low-density nodes on either side of the neck. Vascular: Normal Limited intracranial: Normal Visualized orbits: Normal Mastoids and visualized paranasal sinuses: Clear Skeleton: Previous cervical fusion C3 through T1. Old fracture at C5. Upper chest: See results of chest CT. Other: None IMPRESSION: No acute or significant finding in the neck. Soft tissues are unremarkable. Old C5 fracture with fusion from C3 through T1. Electronically Signed   By: Paulina Fusi M.D.   On: 12/12/2016 20:13   Ct Chest W Contrast  Result Date: 12/12/2016 CLINICAL DATA:  Quadriplegia. Shortness of breath and chest discomfort. EXAM: CT CHEST WITH CONTRAST TECHNIQUE: Multidetector CT imaging of the chest was performed during intravenous contrast administration. CONTRAST:  75mL ISOVUE-300 IOPAMIDOL (ISOVUE-300) INJECTION 61% COMPARISON:  Radiography same day FINDINGS: Cardiovascular: Heart  size is normal. Systemic arterial structures are normal. No pulmonary arterial abnormality seen. Mediastinum/Nodes: No mass or lymphadenopathy. Lungs/Pleura: Mucous layering in the dependent trachea. Bronchial tree on the left is patent and the left lung is clear. Bronchial tree on the right shows filling of the bronchial airways in the right lower lobe with developing alveolar infiltrates consistent with bronchopneumonia. No pleural effusion on either side. Upper Abdomen: Normal Musculoskeletal: No abnormality in the bony thorax. IMPRESSION: Opacification of the right lower lobe bronchi with developing alveolar infiltrates in the right lower lobe consistent with right lower lobe bronchopneumonia. Electronically Signed   By: Paulina FusiMark  Shogry M.D.   On: 12/12/2016 20:17     Subjective: No complains, breathing improved.  Discharge Exam: Vitals:   12/15/16 2135 12/16/16 0625  BP: 120/67 (!) 145/97  Pulse: 96 70  Resp: 16 16  Temp: 99.3 F (37.4 C) 97.8 F (36.6 C)   Vitals:   12/15/16 2135 12/16/16 0253 12/16/16 0625 12/16/16 0858  BP: 120/67  (!) 145/97   Pulse: 96  70   Resp: 16  16   Temp: 99.3 F (37.4 C)  97.8 F (36.6 C)   TempSrc: Oral  Oral   SpO2: 99% 100% 100% 97%  Weight:      Height:        General: Pt is alert, awake, not in acute distress Cardiovascular: RRR, S1/S2 +, no rubs, no gallops Respiratory: CTA bilaterally, no wheezing, no rhonchi Abdominal: Soft, NT, ND, bowel sounds + Extremities: no edema, no cyanosis    The results of significant diagnostics from this hospitalization (including imaging, microbiology, ancillary and laboratory) are listed below for reference.     Microbiology: Recent Results (from the past 240 hour(s))  Blood culture (routine x 2)     Status: None (Preliminary result)   Collection Time: 12/12/16  9:26 PM  Result Value Ref Range Status   Specimen Description BLOOD RIGHT FOREARM  Final   Special Requests BOTTLES DRAWN AEROBIC AND  ANAEROBIC 5ML  Final   Culture   Final    NO GROWTH 2 DAYS Performed at [facility]    Report Status PENDING  Incomplete  Blood culture (routine x 2)     Status: None (Preliminary result)   Collection Time: 12/12/16  9:26 PM  Result Value Ref Range Status   Specimen Description BLOOD LEFT ANTECUBITAL  Final   Special Requests BOTTLES DRAWN AEROBIC AND ANAEROBIC 5ML  Final   Culture   Final    NO GROWTH 2 DAYS Performed at [facility]    Report Status PENDING  Incomplete  Culture, sputum-assessment     Status: None   Collection Time: 12/13/16  2:30 AM  Result Value Ref Range Status   Specimen Description SPUTUM  Final   Special Requests NONE  Final   Sputum evaluation THIS SPECIMEN IS ACCEPTABLE FOR SPUTUM CULTURE  Final   Report Status 12/13/2016 FINAL  Final  Culture, respiratory (NON-Expectorated)     Status: None   Collection Time: 12/13/16  2:30 AM  Result Value Ref Range Status   Specimen Description SPUTUM  Final   Special Requests NONE Reflexed from [id number]  Final   Gram Stain   Final    ABUNDANT WBC PRESENT, PREDOMINANTLY PMN FEW GRAM POSITIVE COCCI IN CLUSTERS RARE GRAM NEGATIVE COCCOBACILLI    Culture   Final    Consistent with normal  respiratory flora. Performed at [facility]    Report Status 12/15/2016 FINAL  Final     Labs: BNP (last 3 results) No results for input(s): BNP in the last 8760 hours. Basic Metabolic Panel:  Recent Labs Lab 12/12/16 1654 12/13/16 0425 12/14/16 0454  NA 136 140 139  K 3.7 3.7 3.5  CL 102 106 106  CO2 22 23 25   GLUCOSE 112* 94 104*  BUN 12 8 <5*  CREATININE 0.51* 0.49* 0.47*  CALCIUM 8.8* 8.4* 8.2*  MG  --   --  2.1   Liver Function Tests: No results for input(s): AST, ALT, ALKPHOS, BILITOT, PROT, ALBUMIN in the last 168 hours. No results for input(s): LIPASE, AMYLASE in the last 168 hours. No results for input(s): AMMONIA in the last 168 hours. CBC:  Recent Labs Lab 12/12/16 1654  12/13/16 0425 12/14/16 0454 12/15/16 1436  WBC 24.8* 23.0* 21.6* 12.5*  NEUTROABS 19.1*  --  16.8*  --   HGB 13.5 12.1* 12.0* 12.6*  HCT 39.4 36.1* 35.5* 37.2*  MCV 85.5 86.2 86.6 84.7  PLT 316 306 264 327   Cardiac Enzymes: No results for input(s): CKTOTAL, CKMB, CKMBINDEX, TROPONINI in the last 168 hours. BNP: Invalid input(s): POCBNP CBG: No results for input(s): GLUCAP in the last 168 hours. D-Dimer No results for input(s): DDIMER in the last 72 hours. Hgb A1c No results for input(s): HGBA1C in the last 72 hours. Lipid Profile No results for input(s): CHOL, HDL, LDLCALC, TRIG, CHOLHDL, LDLDIRECT in the last 72 hours. Thyroid function studies No results for input(s): TSH, T4TOTAL, T3FREE, THYROIDAB in the last 72 hours.  Invalid input(s): FREET3 Anemia work up No results for input(s): VITAMINB12, FOLATE, FERRITIN, TIBC, IRON, RETICCTPCT in the last 72 hours. Urinalysis    Component Value Date/Time   COLORURINE YELLOW 09/05/2014 1445   APPEARANCEUR CLEAR 09/05/2014 1445   LABSPEC 1.013 09/05/2014 1445   PHURINE 7.5 09/05/2014 1445   GLUCOSEU NEGATIVE 09/05/2014 1445   HGBUR NEGATIVE 09/05/2014 1445   BILIRUBINUR NEGATIVE 09/05/2014 1445   KETONESUR NEGATIVE 09/05/2014 1445   PROTEINUR NEGATIVE 09/05/2014 1445   UROBILINOGEN 0.2 09/05/2014 1445   NITRITE NEGATIVE 09/05/2014 1445   LEUKOCYTESUR NEGATIVE 09/05/2014 1445   Sepsis Labs Invalid input(s): PROCALCITONIN,  WBC,  LACTICIDVEN Microbiology Recent Results (from the past 240 hour(s))  Blood culture (routine x 2)     Status: None (Preliminary result)   Collection Time: 12/12/16  9:26 PM  Result Value Ref Range Status   Specimen Description BLOOD RIGHT FOREARM  Final   Special Requests BOTTLES DRAWN AEROBIC AND ANAEROBIC  Final   Culture   Final    NO GROWTH 2 DAYS Performed at [facility]    Report Status PENDING  Incomplete  Blood culture (routine x 2)     Status: None (Preliminary result)    Collection Time: 12/12/16  9:26 PM  Result Value Ref Range Status   Specimen Description BLOOD LEFT ANTECUBITAL  Final   Special Requests BOTTLES DRAWN AEROBIC AND ANAEROBIC  Final   Culture   Final    NO GROWTH 2 DAYS Performed at [facility]    Report Status PENDING  Incomplete  Culture, sputum-assessment     Status: None   Collection Time: 12/13/16  2:30 AM  Result Value Ref Range Status   Specimen Description SPUTUM  Final   Special Requests NONE  Final   Sputum evaluation THIS SPECIMEN IS ACCEPTABLE FOR SPUTUM CULTURE  Final   Report Status 12/13/2016 FINAL  Final  Culture, respiratory (NON-Expectorated)     Status: None   Collection Time: 12/13/16  2:30 AM  Result Value Ref Range Status   Specimen Description SPUTUM  Final   Special Requests NONE Reflexed from [id number]  Final   Gram Stain   Final    ABUNDANT WBC PRESENT, PREDOMINANTLY PMN FEW GRAM POSITIVE COCCI IN CLUSTERS RARE GRAM NEGATIVE COCCOBACILLI    Culture   Final    Consistent with normal respiratory flora. Performed at [facility]    Report Status 12/15/2016 FINAL  Final     Time coordinating discharge: Over 30 minutes  SIGNED:   Marinda Elk, MD  [facility] 12/16/2016, 11:48 AM Pager   If 7PM-7AM, please contact night-coverage [email or web address] Password [email or web address]

## 2016-12-16 NOTE — Progress Notes (Signed)
Pt refused cpt/vest at this time. 

## 2016-12-16 NOTE — Progress Notes (Signed)
Pt refused chest vest this am.  He says that he feels bad from using it yesterday.  I told him that I would come by later in the day to do it.

## 2016-12-18 LAB — CULTURE, BLOOD (ROUTINE X 2)
CULTURE: NO GROWTH
Culture: NO GROWTH

## 2017-01-24 DIAGNOSIS — R109 Unspecified abdominal pain: Secondary | ICD-10-CM | POA: Diagnosis not present

## 2017-01-24 DIAGNOSIS — M62838 Other muscle spasm: Secondary | ICD-10-CM | POA: Diagnosis not present

## 2017-01-24 DIAGNOSIS — N319 Neuromuscular dysfunction of bladder, unspecified: Secondary | ICD-10-CM | POA: Diagnosis not present

## 2017-01-24 DIAGNOSIS — G894 Chronic pain syndrome: Secondary | ICD-10-CM | POA: Diagnosis not present

## 2017-01-24 DIAGNOSIS — G8250 Quadriplegia, unspecified: Secondary | ICD-10-CM | POA: Diagnosis not present

## 2017-01-24 DIAGNOSIS — M6250 Muscle wasting and atrophy, not elsewhere classified, unspecified site: Secondary | ICD-10-CM | POA: Diagnosis not present

## 2017-01-24 DIAGNOSIS — J189 Pneumonia, unspecified organism: Secondary | ICD-10-CM | POA: Diagnosis not present

## 2017-01-24 DIAGNOSIS — R197 Diarrhea, unspecified: Secondary | ICD-10-CM | POA: Diagnosis not present

## 2017-06-30 DIAGNOSIS — Z733 Stress, not elsewhere classified: Secondary | ICD-10-CM | POA: Diagnosis not present

## 2017-06-30 DIAGNOSIS — Z993 Dependence on wheelchair: Secondary | ICD-10-CM | POA: Diagnosis not present

## 2017-06-30 DIAGNOSIS — G8250 Quadriplegia, unspecified: Secondary | ICD-10-CM | POA: Diagnosis not present

## 2017-06-30 DIAGNOSIS — N318 Other neuromuscular dysfunction of bladder: Secondary | ICD-10-CM | POA: Diagnosis not present

## 2017-06-30 DIAGNOSIS — R3981 Functional urinary incontinence: Secondary | ICD-10-CM | POA: Diagnosis not present

## 2017-06-30 DIAGNOSIS — M62838 Other muscle spasm: Secondary | ICD-10-CM | POA: Diagnosis not present

## 2017-06-30 DIAGNOSIS — G894 Chronic pain syndrome: Secondary | ICD-10-CM | POA: Diagnosis not present

## 2017-08-07 DIAGNOSIS — G8250 Quadriplegia, unspecified: Secondary | ICD-10-CM | POA: Diagnosis not present

## 2017-08-07 DIAGNOSIS — R2689 Other abnormalities of gait and mobility: Secondary | ICD-10-CM | POA: Diagnosis not present

## 2017-11-29 DIAGNOSIS — N3020 Other chronic cystitis without hematuria: Secondary | ICD-10-CM | POA: Diagnosis not present

## 2017-11-29 DIAGNOSIS — N3942 Incontinence without sensory awareness: Secondary | ICD-10-CM | POA: Diagnosis not present

## 2017-12-14 DIAGNOSIS — Z79899 Other long term (current) drug therapy: Secondary | ICD-10-CM | POA: Diagnosis not present

## 2018-02-01 DIAGNOSIS — G8250 Quadriplegia, unspecified: Secondary | ICD-10-CM | POA: Diagnosis not present

## 2018-02-01 DIAGNOSIS — Z8371 Family history of colonic polyps: Secondary | ICD-10-CM | POA: Diagnosis not present

## 2018-02-01 DIAGNOSIS — T83098D Other mechanical complication of other indwelling urethral catheter, subsequent encounter: Secondary | ICD-10-CM | POA: Diagnosis not present

## 2018-02-01 DIAGNOSIS — M62838 Other muscle spasm: Secondary | ICD-10-CM | POA: Diagnosis not present

## 2018-02-01 DIAGNOSIS — G8929 Other chronic pain: Secondary | ICD-10-CM | POA: Diagnosis not present

## 2018-02-15 DIAGNOSIS — N3020 Other chronic cystitis without hematuria: Secondary | ICD-10-CM | POA: Diagnosis not present

## 2018-02-15 DIAGNOSIS — N3941 Urge incontinence: Secondary | ICD-10-CM | POA: Diagnosis not present

## 2018-02-21 ENCOUNTER — Other Ambulatory Visit: Payer: Self-pay | Admitting: Urology

## 2018-02-21 DIAGNOSIS — Z822 Family history of deafness and hearing loss: Secondary | ICD-10-CM | POA: Diagnosis not present

## 2018-02-21 DIAGNOSIS — H6123 Impacted cerumen, bilateral: Secondary | ICD-10-CM | POA: Diagnosis not present

## 2018-02-21 DIAGNOSIS — H9313 Tinnitus, bilateral: Secondary | ICD-10-CM | POA: Diagnosis not present

## 2018-02-22 ENCOUNTER — Other Ambulatory Visit: Payer: Self-pay

## 2018-02-22 ENCOUNTER — Encounter (HOSPITAL_COMMUNITY): Payer: Self-pay | Admitting: *Deleted

## 2018-02-23 NOTE — Progress Notes (Signed)
LVMM for patient with new date of surgery on 03/06/2018 and to arrive at 0800am for surgery time of 1030am-1104am.  Asked patient to call back to verify he received message.

## 2018-03-01 DIAGNOSIS — H6983 Other specified disorders of Eustachian tube, bilateral: Secondary | ICD-10-CM | POA: Diagnosis not present

## 2018-03-05 NOTE — H&P (Signed)
The patient is a C5 quadriplegic followed by Dr. Karie Schwalbe. I believe he has had 3 Botox the last being April 2017 and 200 units is utilized. After Botox he catheterizes 40 8 times a day and is dry for about 4 months. Then he starts leaking and will go back on oxybutynin. Other times when he leaks a lot he put in a Foley catheter for up to a week and then takes oxybutynin. He has a catheter now. He generally does not get a lot of bladder infections.   He has not had previous bladder surgery or kidney stones   The patient will be treated under general anesthesia with 200 units. He understands my culture routine especially in someone that could be chronically colonized. We will proceed accordingly   In the last year the patient has a little bit more discomfort in the left lower quadrant and suprapubic area that is vague and may represent a neurogenic reflux. I will ask him after I sterilize his urine if this went away. I will then ask if the Botox to cut away. He understands that in my opinion he does not require repeat urodynamics but in the future I will be keeping an eye on his kidneys with a renal ultrasound   Today  increased frequency and incontinence  wants botox  not infected  dictaphone broke  urine c/s sent  schedule botox and call in antibiotics pending c/s  cipro rx given for 5 days 250 mg bid for 5 days   There is no other aggravating or relieving factors  There is no other associated signs and symptoms  The severity of the symptoms is moderate  The symptoms are ongoing and bothersome      ALLERGIES: No Known Drug Allergies    MEDICATIONS: Baclofen 20 mg tablet Oral  Hydrocodone-Acetaminophen 5 mg-325 mg tablet Oral  Oxybutynin Chloride Er 15 mg tablet, extended release 24 hr  Oxybutynin Chloride Er 15 mg tablet, extended release 24 hr 2 Oral Daily     GU PSH: Cystoscopy - 2010 Cystourethroscopy, W/Injection For Chemodenervation Of Bladder - 05/05/2016 Urethrolysis - 2016, 2015,  2013      PSH Notes: Cystoscopy With Injection For Chemodenervation Of Bladder, Urologic Surgery, Urologic Surgery, Urologic Surgery, Cystoscopy (Diagnostic), Cervical Vertebral Fusion   NON-GU PSH: Neck Spine Fusion - 2010    GU PMH: Other Disorders Of Bladder, Bladder spasms - 05/02/2016 Incontinence w/o Sensation, Urinary incontinence without sensory awareness - 03/24/2016 Urge incontinence, Urge incontinence of urine - 03/24/2016 Urinary incontinence, Unspec, Urinary incontinence - 2016 Abdominal Pain Unspec, Left flank pain - 2015, Abdominal pain, - 2014 Chronic cystitis (w/o hematuria), Chronic cystitis - 2015 Acute Cystitis/UTI, Acute cystitis without hematuria - 2014      PMH Notes:  2009-04-28 13:14:35 - Note: No Medical Problems   NON-GU PMH: Autonomic dysreflexia, 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, C5-C7 incomplete, Quadriplegia, C5-C7, incomplete - 2016 Unspecified injury at C7 level of cervical spinal cord, initial encounter, Closed fracture of C5-C7 level with spinal cord injury - 2015 Encounter for general adult medical examination without abnormal findings, Encounter for preventive health examination - 2015 Candidal stomatitis, Oral thrush - 2014    FAMILY HISTORY: Colon Cancer - Mother Family Health Status - Father alive at age 32 - Runs In Family Mother Deceased At Age 852 from diabetic complicati - Runs In Family Prostate Cancer - Runs In Family   SOCIAL HISTORY: Marital Status: Single Preferred Language: English; Ethnicity: Not Hispanic Or Latino; Race: White  Notes: Married, Marital History - Single, Caffeine Use, Occupation:, Previous History Of Smoking, Alcohol Use   REVIEW OF SYSTEMS:    GU Review Male:   Patient reports frequent urination. Patient denies hard to postpone urination, burning/ pain with urination, get up at night to urinate, leakage of urine, stream starts and stops, trouble starting your stream, have to strain to urinate , erection  problems, and penile pain.  Gastrointestinal (Upper):   Patient denies nausea, vomiting, and indigestion/ heartburn.  Gastrointestinal (Lower):   Patient denies diarrhea and constipation.  Constitutional:   Patient denies night sweats, fever, weight loss, and fatigue.  Skin:   Patient denies skin rash/ lesion and itching.  Eyes:   Patient denies blurred vision and double vision.  Ears/ Nose/ Throat:   Patient denies sore throat and sinus problems.  Hematologic/Lymphatic:   Patient denies swollen glands and easy bruising.  Cardiovascular:   Patient denies leg swelling and chest pains.  Respiratory:   Patient denies cough and shortness of breath.  Endocrine:   Patient denies excessive thirst.  Musculoskeletal:   Patient denies back pain and joint pain.  Neurological:   Patient denies headaches and dizziness.  Psychologic:   Patient denies depression and anxiety.   VITAL SIGNS:      02/15/2018 02:56 PM  Height 70 in / 177.8 cm  BP 110/60 mmHg  Pulse 78 /min  Temperature 97.8 F / 36.5 C   PAST DATA REVIEWED:  Source Of History:  Patient   PROCEDURES: None   ASSESSMENT:      ICD-10 Details  1 GU:   Chronic cystitis (w/o hematuria) - N30.20   2   Urge incontinence - N39.41      PLAN:            Medications New Meds: Cipro 250 mg tablet 1 tablet PO BID Take Medication 3 days prior to procedure and two days after  #20  0 Refill(s)          After a thorough review of the management options for the patient's condition the patient  elected to proceed with surgical therapy as noted above. We have discussed the potential benefits and risks of the procedure, side effects of the proposed treatment, the likelihood of the patient achieving the goals of the procedure, and any potential problems that might occur during the procedure or recuperation. Informed consent has been obtained.

## 2018-03-05 NOTE — Progress Notes (Signed)
Left voice mail message for patient regarding surgery time of 1030-1100am and to arrive at 0800am in Admitting.  Instructed patient to call Short Stay with any questions at [phone].

## 2018-03-06 ENCOUNTER — Encounter (HOSPITAL_COMMUNITY)
Admission: RE | Disposition: A | Discharge status: Home / Self Care | Payer: Self-pay | Source: Ambulatory Visit | Attending: Urology

## 2018-03-06 ENCOUNTER — Ambulatory Visit (HOSPITAL_COMMUNITY): Payer: Medicare Other | Admitting: Anesthesiology

## 2018-03-06 ENCOUNTER — Ambulatory Visit (HOSPITAL_COMMUNITY)
Admission: RE | Admit: 2018-03-06 | Discharge: 2018-03-06 | Disposition: A | Discharge status: Home / Self Care | Payer: Medicare Other | Source: Ambulatory Visit | Attending: Urology | Admitting: Urology

## 2018-03-06 ENCOUNTER — Encounter (HOSPITAL_COMMUNITY): Payer: Self-pay | Admitting: *Deleted

## 2018-03-06 DIAGNOSIS — N3281 Overactive bladder: Secondary | ICD-10-CM | POA: Diagnosis not present

## 2018-03-06 DIAGNOSIS — Z87891 Personal history of nicotine dependence: Secondary | ICD-10-CM | POA: Diagnosis not present

## 2018-03-06 DIAGNOSIS — Z79891 Long term (current) use of opiate analgesic: Secondary | ICD-10-CM | POA: Insufficient documentation

## 2018-03-06 DIAGNOSIS — N3941 Urge incontinence: Secondary | ICD-10-CM | POA: Insufficient documentation

## 2018-03-06 DIAGNOSIS — Z79899 Other long term (current) drug therapy: Secondary | ICD-10-CM | POA: Diagnosis not present

## 2018-03-06 DIAGNOSIS — N3289 Other specified disorders of bladder: Secondary | ICD-10-CM | POA: Insufficient documentation

## 2018-03-06 DIAGNOSIS — R350 Frequency of micturition: Secondary | ICD-10-CM | POA: Diagnosis present

## 2018-03-06 DIAGNOSIS — D72829 Elevated white blood cell count, unspecified: Secondary | ICD-10-CM | POA: Diagnosis not present

## 2018-03-06 DIAGNOSIS — N3020 Other chronic cystitis without hematuria: Secondary | ICD-10-CM | POA: Insufficient documentation

## 2018-03-06 DIAGNOSIS — N319 Neuromuscular dysfunction of bladder, unspecified: Secondary | ICD-10-CM | POA: Insufficient documentation

## 2018-03-06 DIAGNOSIS — G8254 Quadriplegia, C5-C7 incomplete: Secondary | ICD-10-CM | POA: Diagnosis not present

## 2018-03-06 HISTORY — PX: BOTOX INJECTION: SHX5754

## 2018-03-06 LAB — CBC
HEMATOCRIT: 40.2 % (ref 39.0–52.0)
HEMOGLOBIN: 13.4 g/dL (ref 13.0–17.0)
MCH: 29.5 pg (ref 26.0–34.0)
MCHC: 33.3 g/dL (ref 30.0–36.0)
MCV: 88.4 fL (ref 78.0–100.0)
PLATELETS: 243 10*3/uL (ref 150–400)
RBC: 4.55 MIL/uL (ref 4.22–5.81)
RDW: 13.3 % (ref 11.5–15.5)
WBC: 5.4 10*3/uL (ref 4.0–10.5)

## 2018-03-06 SURGERY — BOTOX INJECTION
Anesthesia: General

## 2018-03-06 MED ORDER — ONABOTULINUMTOXINA 100 UNITS IJ SOLR
100.0000 [IU] | Freq: Once | INTRAMUSCULAR | Status: AC
  Administered 2018-03-06: 100 [IU] via INTRAMUSCULAR
  Filled 2018-03-06: qty 100

## 2018-03-06 MED ORDER — OXYCODONE HCL 5 MG/5ML PO SOLN
5.0000 mg | Freq: Once | ORAL | Status: DC | PRN
  Filled 2018-03-06: qty 5

## 2018-03-06 MED ORDER — MIDAZOLAM HCL 5 MG/5ML IJ SOLN
INTRAMUSCULAR | Status: DC | PRN
  Administered 2018-03-06: 2 mg via INTRAVENOUS

## 2018-03-06 MED ORDER — SODIUM CHLORIDE 0.9 % IJ SOLN
INTRAMUSCULAR | Status: AC
  Filled 2018-03-06: qty 20

## 2018-03-06 MED ORDER — FENTANYL CITRATE (PF) 100 MCG/2ML IJ SOLN
25.0000 ug | INTRAMUSCULAR | Status: DC | PRN
  Administered 2018-03-06: 25 ug via INTRAVENOUS

## 2018-03-06 MED ORDER — PROPOFOL 10 MG/ML IV BOLUS
INTRAVENOUS | Status: DC | PRN
  Administered 2018-03-06: 140 mg via INTRAVENOUS

## 2018-03-06 MED ORDER — ONDANSETRON HCL 4 MG/2ML IJ SOLN
INTRAMUSCULAR | Status: DC | PRN
  Administered 2018-03-06: 4 mg via INTRAVENOUS

## 2018-03-06 MED ORDER — CEFAZOLIN SODIUM-DEXTROSE 2-4 GM/100ML-% IV SOLN
2.0000 g | INTRAVENOUS | Status: AC
  Administered 2018-03-06: 2 g via INTRAVENOUS
  Filled 2018-03-06: qty 100

## 2018-03-06 MED ORDER — LACTATED RINGERS IV SOLN
INTRAVENOUS | Status: DC
  Administered 2018-03-06 (×2): via INTRAVENOUS

## 2018-03-06 MED ORDER — DEXAMETHASONE SODIUM PHOSPHATE 10 MG/ML IJ SOLN
INTRAMUSCULAR | Status: AC
  Filled 2018-03-06: qty 2

## 2018-03-06 MED ORDER — MIDAZOLAM HCL 2 MG/2ML IJ SOLN
INTRAMUSCULAR | Status: AC
  Filled 2018-03-06: qty 2

## 2018-03-06 MED ORDER — FENTANYL CITRATE (PF) 100 MCG/2ML IJ SOLN
INTRAMUSCULAR | Status: AC
  Filled 2018-03-06: qty 2

## 2018-03-06 MED ORDER — PROPOFOL 10 MG/ML IV BOLUS
INTRAVENOUS | Status: AC
  Filled 2018-03-06: qty 20

## 2018-03-06 MED ORDER — LIDOCAINE 2% (20 MG/ML) 5 ML SYRINGE
INTRAMUSCULAR | Status: DC | PRN
  Administered 2018-03-06: 60 mg via INTRAVENOUS

## 2018-03-06 MED ORDER — DEXAMETHASONE SODIUM PHOSPHATE 10 MG/ML IJ SOLN
INTRAMUSCULAR | Status: DC | PRN
  Administered 2018-03-06: 5 mg via INTRAVENOUS

## 2018-03-06 MED ORDER — ONDANSETRON HCL 4 MG/2ML IJ SOLN: 4.0000 mg | Freq: Once | INTRAMUSCULAR | Status: DC | PRN

## 2018-03-06 MED ORDER — SODIUM CHLORIDE 0.9 % IJ SOLN
INTRAMUSCULAR | Status: DC | PRN
  Administered 2018-03-06: 20 mL

## 2018-03-06 MED ORDER — STERILE WATER FOR IRRIGATION IR SOLN
Status: DC | PRN
  Administered 2018-03-06: 3000 mL via INTRAVESICAL

## 2018-03-06 MED ORDER — OXYCODONE HCL 5 MG PO TABS: 5.0000 mg | ORAL_TABLET | Freq: Once | ORAL | Status: DC | PRN

## 2018-03-06 MED ORDER — ONDANSETRON HCL 4 MG/2ML IJ SOLN
INTRAMUSCULAR | Status: AC
  Filled 2018-03-06: qty 4

## 2018-03-06 SURGICAL SUPPLY — 22 items
BAG URINE LEG 500ML (DRAIN) ×1 IMPLANT
BAG URO CATCHER STRL LF (MISCELLANEOUS) ×2 IMPLANT
CATH FOLEY 2WAY SLVR  5CC 16FR (CATHETERS) ×1
CATH FOLEY 2WAY SLVR 5CC 16FR (CATHETERS) IMPLANT
CLOTH BEACON ORANGE TIMEOUT ST (SAFETY) IMPLANT
COVER FOOTSWITCH UNIV (MISCELLANEOUS) IMPLANT
COVER SURGICAL LIGHT HANDLE (MISCELLANEOUS) ×1 IMPLANT
DEFLUX NEEDLE (NEEDLE) IMPLANT
DEFLUX SYRINGE (SYRINGE) IMPLANT
ELECT REM PT RETURN 9FT ADLT (ELECTROSURGICAL) ×2
ELECTRODE REM PT RTRN 9FT ADLT (ELECTROSURGICAL) ×1 IMPLANT
GLOVE BIOGEL M STRL SZ7.5 (GLOVE) ×2 IMPLANT
GOWN STRL REUS W/TWL XL LVL3 (GOWN DISPOSABLE) ×2 IMPLANT
HOLDER FOLEY CATH W/STRAP (MISCELLANEOUS) ×1 IMPLANT
NDL SAFETY ECLIPSE 18X1.5 (NEEDLE) IMPLANT
NEEDLE HYPO 18GX1.5 SHARP (NEEDLE)
NS IRRIG 1000ML POUR BTL (IV SOLUTION) ×2 IMPLANT
PACK CYSTO (CUSTOM PROCEDURE TRAY) ×2 IMPLANT
SYR CONTROL 10ML LL (SYRINGE) ×1 IMPLANT
TUBING CONNECTING 10 (TUBING) ×1 IMPLANT
WATER STERILE IRR 3000ML UROMA (IV SOLUTION) ×1 IMPLANT
WATER STERILE IRR 500ML POUR (IV SOLUTION) ×1 IMPLANT

## 2018-03-06 NOTE — Interval H&P Note (Signed)
History and Physical Interval Note:  03/06/2018 9:37 AM  Curtis Mann  has presented today for surgery, with the diagnosis of REFRACTORY URGENCY INCONTINENCE  The various methods of treatment have been discussed with the patient and family. After consideration of risks, benefits and other options for treatment, the patient has consented to  Procedure(s): BOTOX INJECTION CYSTOSCOPY (N/A) as a surgical intervention .  The patient's history has been reviewed, patient examined, no change in status, stable for surgery.  I have reviewed the patient's chart and labs.  Questions were answered to the patient's satisfaction.     Micole Delehanty A

## 2018-03-06 NOTE — Op Note (Signed)
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 Lorin PicketScott Delayla Hoffmaster  The patient has the above diagnosis and consented the above procedure.  Preoperative antibiotics were given.  The [place] injection scope was utilized.  The penile bulbar membranous and prosthetic urethra normal.  Trigone was normal.  He a grade 1 of 4 bladder trabeculation.  A Foley catheter been removed prior.  Clinically there was no cystitis.  I injected 200 units of Botox and 20 cc of normal saline utilizing the usual template at 5 and 1:617:00 and cephalad to the interureteric ridge.  I injected also a little bit higher in the bladder.  There was 2 injection sites that were oozing minimally so I fulgurated them with a Bugbee electrode well-prepared with a normal cystoscope and 30 degree lens.  There was no bladder injury.  Urine was clear.  Foley catheter was reinserted  The patient will be followed as per protocol

## 2018-03-06 NOTE — Anesthesia Procedure Notes (Signed)
Procedure Name: LMA Insertion Performed by: Ronit Marczak J, CRNA Pre-anesthesia Checklist: Patient identified, Emergency Drugs available, Suction available, Patient being monitored and Timeout performed Patient Re-evaluated:Patient Re-evaluated prior to induction Oxygen Delivery Method: Circle system utilized Preoxygenation: Pre-oxygenation with 100% oxygen Induction Type: IV induction Ventilation: Mask ventilation without difficulty LMA: LMA inserted LMA Size: 4.0 Number of attempts: 1 Placement Confirmation: positive ETCO2,  CO2 detector and breath sounds checked- equal and bilateral Tube secured with: Tape Dental Injury: Teeth and Oropharynx as per pre-operative assessment        

## 2018-03-06 NOTE — Transfer of Care (Signed)
Immediate Anesthesia Transfer of Care Note  Patient: Curtis Mann  Procedure(s) Performed: BOTOX INJECTION CYSTOSCOPY WITH FULGURATION (N/A )  Patient Location: PACU  Anesthesia Type:General  Level of Consciousness: awake, alert  and oriented  Airway & Oxygen Therapy: Patient Spontanous Breathing and Patient connected to face mask oxygen  Post-op Assessment: Report given to RN and Post -op Vital signs reviewed and stable  Post vital signs: Reviewed and stable  Last Vitals:  Vitals Value Taken Time  BP    Temp    Pulse 62 03/06/2018 12:02 PM  Resp    SpO2 100 % 03/06/2018 12:02 PM  Vitals shown include unvalidated device data.  Last Pain:  Vitals:   03/06/18 0903  TempSrc:   PainSc: 4          Complications: No apparent anesthesia complications

## 2018-03-06 NOTE — Anesthesia Postprocedure Evaluation (Signed)
Anesthesia Post Note  Patient: Curtis Mann  Procedure(s) Performed: BOTOX INJECTION CYSTOSCOPY WITH FULGURATION (N/A )     Patient location during evaluation: PACU Anesthesia Type: General Level of consciousness: awake and alert Pain management: pain level controlled Vital Signs Assessment: post-procedure vital signs reviewed and stable Respiratory status: spontaneous breathing, nonlabored ventilation, respiratory function stable and patient connected to nasal cannula oxygen Cardiovascular status: blood pressure returned to baseline and stable Postop Assessment: no apparent nausea or vomiting Anesthetic complications: no    Last Vitals:  Vitals:   03/06/18 1215 03/06/18 1315  BP: (!) 116/44 (!) 97/52  Pulse: 60 63  Resp: 15   Temp:    SpO2: 100% 98%    Last Pain:  Vitals:   03/06/18 1315  TempSrc:   PainSc: 4                  Denny Mccree COKER

## 2018-03-06 NOTE — Progress Notes (Signed)
Aggie Cosierheresa, NT is short stay reported cell phone is located in patient belongings bag with wheelchair.  Patient with no family present.

## 2018-03-06 NOTE — Discharge Instructions (Signed)
CYSTOSCOPY HOME CARE INSTRUCTIONS  Activity: Rest for the remainder of the day.  Do not drive or operate equipment today.  You may resume normal activities in one to two days as instructed by your physician.   Meals: Drink plenty of liquids and eat light foods such as gelatin or soup this evening.  You may return to a normal meal plan tomorrow.  Return to Work: You may return to work in one to two days or as instructed by your physician.  Special Instructions / Symptoms: Call your physician if any of these symptoms occur:   -persistent or heavy bleeding  -bleeding which continues after first few urination  -large blood clots that are difficult to pass  -urine stream diminishes or stops completely  -fever equal to or higher than 101 degrees Farenheit.  -cloudy urine with a strong, foul odor  -severe pain  Females should always wipe from front to back after elimination.  You may feel some burning pain when you urinate.  This should disappear with time.  Applying moist heat to the lower abdomen or a hot tub bath may help relieve the pain. \  Follow-Up / Date of Return Visit to Your Physician: as instructed Call for an appointment to arrange follow-up.  Patient Signature:  ________________________________________________________  Nurse's Signature:  ________________________________________________________    Post Anesthesia Home Care Instructions  Activity: Get plenty of rest for the remainder of the day. A responsible individual must stay with you for 24 hours following the procedure.  For the next 24 hours, DO NOT: -Drive a car -Advertising copywriterperate machinery -Drink alcoholic beverages -Take any medication unless instructed by your physician -Make any legal decisions or sign important papers.  Meals: Start with liquid foods such as gelatin or soup. Progress to regular foods as tolerated. Avoid greasy, spicy, heavy foods. If nausea and/or vomiting occur, drink only clear liquids until  the nausea and/or vomiting subsides. Call your physician if vomiting continues.  Special Instructions/Symptoms: Your throat may feel dry or sore from the anesthesia or the breathing tube placed in your throat during surgery. If this causes discomfort, gargle with warm salt water. The discomfort should disappear within 24 hours.  If you had a scopolamine patch placed behind your ear for the management of post- operative nausea and/or vomiting:  1. The medication in the patch is effective for 72 hours, after which it should be removed.  Wrap patch in a tissue and discard in the trash. Wash hands thoroughly with soap and water. 2. You may remove the patch earlier than 72 hours if you experience unpleasant side effects which may include dry mouth, dizziness or visual disturbances. 3. Avoid touching the patch. Wash your hands with soap and water after contact with the patch.   I have reviewed discharge instructions in detail with the patient. They will follow-up with me or their physician as scheduled. My nurse will also be calling the patients as per protocol.

## 2018-03-06 NOTE — Anesthesia Preprocedure Evaluation (Signed)
Anesthesia Evaluation  Patient identified by MRN, date of birth, ID band Patient awake    Reviewed: Allergy & Precautions, NPO status , Patient's Chart, lab work & pertinent test results  Airway Mallampati: II  TM Distance: >3 FB Neck ROM: Limited    Dental  (+) Teeth Intact, Dental Advisory Given   Pulmonary former smoker,    breath sounds clear to auscultation       Cardiovascular  Rhythm:Regular Rate:Normal     Neuro/Psych    GI/Hepatic   Endo/Other    Renal/GU      Musculoskeletal   Abdominal   Peds  Hematology   Anesthesia Other Findings   Reproductive/Obstetrics                             Anesthesia Physical Anesthesia Plan  ASA: III  Anesthesia Plan: General   Post-op Pain Management:    Induction: Intravenous  PONV Risk Score and Plan: Ondansetron and Dexamethasone  Airway Management Planned: LMA  Additional Equipment:   Intra-op Plan:   Post-operative Plan:   Informed Consent: I have reviewed the patients History and Physical, chart, labs and discussed the procedure including the risks, benefits and alternatives for the proposed anesthesia with the patient or authorized representative who has indicated his/her understanding and acceptance.   Dental advisory given  Plan Discussed with: Anesthesiologist and CRNA  Anesthesia Plan Comments:         Anesthesia Quick Evaluation

## 2018-04-05 DIAGNOSIS — Z79899 Other long term (current) drug therapy: Secondary | ICD-10-CM | POA: Diagnosis not present

## 2018-05-28 ENCOUNTER — Other Ambulatory Visit: Payer: Self-pay

## 2018-05-28 ENCOUNTER — Encounter (HOSPITAL_COMMUNITY): Payer: Self-pay | Admitting: *Deleted

## 2018-05-28 ENCOUNTER — Observation Stay (HOSPITAL_COMMUNITY)
Admission: EM | Admit: 2018-05-28 | Discharge: 2018-05-29 | Disposition: A | Discharge status: Home / Self Care | Payer: Medicare Other | Source: Ambulatory Visit | Attending: Family Medicine | Admitting: Family Medicine

## 2018-05-28 ENCOUNTER — Emergency Department (HOSPITAL_COMMUNITY): Payer: Medicare Other

## 2018-05-28 DIAGNOSIS — K592 Neurogenic bowel, not elsewhere classified: Secondary | ICD-10-CM | POA: Diagnosis not present

## 2018-05-28 DIAGNOSIS — Z79899 Other long term (current) drug therapy: Secondary | ICD-10-CM | POA: Diagnosis not present

## 2018-05-28 DIAGNOSIS — Y846 Urinary catheterization as the cause of abnormal reaction of the patient, or of later complication, without mention of misadventure at the time of the procedure: Secondary | ICD-10-CM | POA: Insufficient documentation

## 2018-05-28 DIAGNOSIS — N390 Urinary tract infection, site not specified: Secondary | ICD-10-CM | POA: Diagnosis not present

## 2018-05-28 DIAGNOSIS — G8250 Quadriplegia, unspecified: Secondary | ICD-10-CM | POA: Diagnosis not present

## 2018-05-28 DIAGNOSIS — I959 Hypotension, unspecified: Secondary | ICD-10-CM | POA: Diagnosis not present

## 2018-05-28 DIAGNOSIS — R338 Other retention of urine: Secondary | ICD-10-CM | POA: Insufficient documentation

## 2018-05-28 DIAGNOSIS — G904 Autonomic dysreflexia: Secondary | ICD-10-CM | POA: Diagnosis not present

## 2018-05-28 DIAGNOSIS — Z87891 Personal history of nicotine dependence: Secondary | ICD-10-CM | POA: Diagnosis not present

## 2018-05-28 DIAGNOSIS — Z882 Allergy status to sulfonamides status: Secondary | ICD-10-CM | POA: Insufficient documentation

## 2018-05-28 DIAGNOSIS — R109 Unspecified abdominal pain: Secondary | ICD-10-CM | POA: Diagnosis not present

## 2018-05-28 DIAGNOSIS — T83511A Infection and inflammatory reaction due to indwelling urethral catheter, initial encounter: Principal | ICD-10-CM | POA: Insufficient documentation

## 2018-05-28 DIAGNOSIS — Z8249 Family history of ischemic heart disease and other diseases of the circulatory system: Secondary | ICD-10-CM | POA: Insufficient documentation

## 2018-05-28 DIAGNOSIS — Z800 Family history of malignant neoplasm of digestive organs: Secondary | ICD-10-CM | POA: Diagnosis not present

## 2018-05-28 DIAGNOSIS — M47816 Spondylosis without myelopathy or radiculopathy, lumbar region: Secondary | ICD-10-CM | POA: Diagnosis not present

## 2018-05-28 DIAGNOSIS — B964 Proteus (mirabilis) (morganii) as the cause of diseases classified elsewhere: Secondary | ICD-10-CM | POA: Diagnosis not present

## 2018-05-28 DIAGNOSIS — N3289 Other specified disorders of bladder: Secondary | ICD-10-CM | POA: Insufficient documentation

## 2018-05-28 DIAGNOSIS — N3281 Overactive bladder: Secondary | ICD-10-CM | POA: Diagnosis not present

## 2018-05-28 DIAGNOSIS — Z8701 Personal history of pneumonia (recurrent): Secondary | ICD-10-CM | POA: Insufficient documentation

## 2018-05-28 DIAGNOSIS — S14105S Unspecified injury at C5 level of cervical spinal cord, sequela: Secondary | ICD-10-CM | POA: Insufficient documentation

## 2018-05-28 DIAGNOSIS — N319 Neuromuscular dysfunction of bladder, unspecified: Secondary | ICD-10-CM | POA: Diagnosis not present

## 2018-05-28 LAB — URINALYSIS, ROUTINE W REFLEX MICROSCOPIC
Bilirubin Urine: NEGATIVE
GLUCOSE, UA: NEGATIVE mg/dL
KETONES UR: 5 mg/dL — AB
Nitrite: NEGATIVE
PH: 8 (ref 5.0–8.0)
Protein, ur: >=300 mg/dL — AB
RBC / HPF: >50 RBC/hpf — ABNORMAL HIGH (ref 0–5)
SPECIFIC GRAVITY, URINE: 1.016 (ref 1.005–1.030)
WBC, UA: >50 WBC/hpf — ABNORMAL HIGH (ref 0–5)

## 2018-05-28 LAB — CBC WITH DIFFERENTIAL/PLATELET
BASOS ABS: 0 10*3/uL (ref 0.0–0.1)
Basophils Relative: 0 %
EOS ABS: 0 10*3/uL (ref 0.0–0.7)
EOS PCT: 0 %
HCT: 44.4 % (ref 39.0–52.0)
Hemoglobin: 15.2 g/dL (ref 13.0–17.0)
Lymphocytes Relative: 6 %
Lymphs Abs: 1.1 10*3/uL (ref 0.7–4.0)
MCH: 29.9 pg (ref 26.0–34.0)
MCHC: 34.2 g/dL (ref 30.0–36.0)
MCV: 87.4 fL (ref 78.0–100.0)
MONO ABS: 1.5 10*3/uL — AB (ref 0.1–1.0)
Monocytes Relative: 8 %
Neutro Abs: 17.3 10*3/uL — ABNORMAL HIGH (ref 1.7–7.7)
Neutrophils Relative %: 86 %
PLATELETS: 275 10*3/uL (ref 150–400)
RBC: 5.08 MIL/uL (ref 4.22–5.81)
RDW: 13 % (ref 11.5–15.5)
WBC: 20 10*3/uL — AB (ref 4.0–10.5)

## 2018-05-28 LAB — BASIC METABOLIC PANEL
ANION GAP: 8 (ref 5–15)
BUN: 16 mg/dL (ref 6–20)
CALCIUM: 9 mg/dL (ref 8.9–10.3)
CO2: 28 mmol/L (ref 22–32)
Chloride: 108 mmol/L (ref 101–111)
Creatinine, Ser: 0.66 mg/dL (ref 0.61–1.24)
GFR calc Af Amer: >60 mL/min (ref >60)
Glucose, Bld: 109 mg/dL — ABNORMAL HIGH (ref 65–99)
Potassium: 4 mmol/L (ref 3.5–5.1)
SODIUM: 144 mmol/L (ref 135–145)

## 2018-05-28 LAB — I-STAT CG4 LACTIC ACID, ED: Lactic Acid, Venous: 1.29 mmol/L (ref 0.5–1.9)

## 2018-05-28 MED ORDER — ACETAMINOPHEN 650 MG RE SUPP: 650.0000 mg | Freq: Four times a day (QID) | RECTAL | Status: DC | PRN

## 2018-05-28 MED ORDER — MORPHINE SULFATE (PF) 2 MG/ML IV SOLN
2.0000 mg | Freq: Once | INTRAVENOUS | Status: AC
  Administered 2018-05-28: 2 mg via INTRAVENOUS
  Filled 2018-05-28: qty 1

## 2018-05-28 MED ORDER — MORPHINE SULFATE (PF) 4 MG/ML IV SOLN
4.0000 mg | Freq: Once | INTRAVENOUS | Status: DC
  Filled 2018-05-28: qty 1

## 2018-05-28 MED ORDER — SODIUM CHLORIDE 0.9 % IV SOLN
1.0000 g | INTRAVENOUS | Status: DC
  Administered 2018-05-28 – 2018-05-29 (×2): 1 g via INTRAVENOUS
  Filled 2018-05-28: qty 10
  Filled 2018-05-28: qty 1

## 2018-05-28 MED ORDER — ACETAMINOPHEN 325 MG PO TABS
650.0000 mg | ORAL_TABLET | Freq: Four times a day (QID) | ORAL | Status: DC | PRN
  Administered 2018-05-29: 650 mg via ORAL
  Filled 2018-05-28: qty 2

## 2018-05-28 MED ORDER — ONDANSETRON HCL 4 MG PO TABS: 4.0000 mg | ORAL_TABLET | Freq: Four times a day (QID) | ORAL | Status: DC | PRN

## 2018-05-28 MED ORDER — OXYBUTYNIN CHLORIDE ER 5 MG PO TB24
15.0000 mg | ORAL_TABLET | Freq: Every morning | ORAL | Status: DC
  Administered 2018-05-29: 15 mg via ORAL
  Filled 2018-05-28: qty 1
  Filled 2018-05-28: qty 3

## 2018-05-28 MED ORDER — BACLOFEN 10 MG PO TABS
20.0000 mg | ORAL_TABLET | Freq: Three times a day (TID) | ORAL | Status: DC
  Administered 2018-05-28 – 2018-05-29 (×2): 20 mg via ORAL
  Filled 2018-05-28 (×2): qty 2

## 2018-05-28 MED ORDER — SODIUM CHLORIDE 0.9% FLUSH
3.0000 mL | Freq: Two times a day (BID) | INTRAVENOUS | Status: DC
  Administered 2018-05-28: 3 mL via INTRAVENOUS

## 2018-05-28 MED ORDER — ONDANSETRON HCL 4 MG/2ML IJ SOLN: 4.0000 mg | Freq: Four times a day (QID) | INTRAMUSCULAR | Status: DC | PRN

## 2018-05-28 MED ORDER — ACETAMINOPHEN 325 MG PO TABS
650.0000 mg | ORAL_TABLET | Freq: Once | ORAL | Status: DC
  Filled 2018-05-28: qty 2

## 2018-05-28 MED ORDER — SODIUM CHLORIDE 0.9 % IV SOLN
INTRAVENOUS | Status: DC
  Administered 2018-05-28 – 2018-05-29 (×4): via INTRAVENOUS

## 2018-05-28 MED ORDER — HYDROCODONE-ACETAMINOPHEN 5-325 MG PO TABS
1.0000 | ORAL_TABLET | Freq: Four times a day (QID) | ORAL | Status: DC | PRN
  Administered 2018-05-28 – 2018-05-29 (×3): 1 via ORAL
  Filled 2018-05-28 (×3): qty 1

## 2018-05-28 MED ORDER — BISACODYL 5 MG PO TBEC: 5.0000 mg | DELAYED_RELEASE_TABLET | Freq: Every day | ORAL | Status: DC | PRN

## 2018-05-28 MED ORDER — ENOXAPARIN SODIUM 40 MG/0.4ML ~~LOC~~ SOLN
40.0000 mg | SUBCUTANEOUS | Status: DC
  Administered 2018-05-28: 40 mg via SUBCUTANEOUS
  Filled 2018-05-28: qty 0.4

## 2018-05-28 MED ORDER — BACLOFEN 10 MG PO TABS
20.0000 mg | ORAL_TABLET | Freq: Once | ORAL | Status: AC
  Administered 2018-05-28: 20 mg via ORAL
  Filled 2018-05-28: qty 2

## 2018-05-28 MED ORDER — ADULT MULTIVITAMIN W/MINERALS CH
1.0000 | ORAL_TABLET | Freq: Every day | ORAL | Status: DC
  Administered 2018-05-29: 1 via ORAL
  Filled 2018-05-28: qty 1

## 2018-05-28 NOTE — H&P (Addendum)
History and Physical    Curtis Mann ZOX:[id number] DOB: Oct 07, 1986  DOA: 05/28/2018 PCP: Florentina Jenny, MD  Patient coming from: Urology office   Chief Complaint: Low BP   HPI: Curtis Mann is a 32 y.o. male with medical history significant of C5 quadriplegic with chronic urinary retention presented to the emergency department after being seen by urology and found to have hypotension.  Patient report bladder being distended for about 2 weeks and having some abdominal discomfort but did not seek medical attention.  Associated symptoms include subjective fever.  Patient was following up with his urology and his blood pressure was around 60/30, therefore he was sent to the ED.  Denies chest pain, shortness of breath and palpitation.  ED Course: BP around 80s/50s, WBC 20, UA grossly abnormal.  Review of Systems:   General: no changes in body weight, no fever chills or decrease in energy.  HEENT: no blurry vision, hearing changes or sore throat Respiratory: no dyspnea, coughing, wheezing CV: no chest pain, no palpitations GI: no nausea, vomiting, abdominal pain, diarrhea, constipation GU: See HPI Ext:. No deformities,  Neuro: no unilateral weakness, numbness, or tingling, no vision change or hearing loss Skin: No rashes, lesions or wounds. MSK: No muscle spasm, no deformity, no limitation of range of movement in spin Heme: No easy bruising.  Travel history: No recent long distant travel.   Past Medical History:  Diagnosis Date  . Complication of anesthesia    in 2015 patient states he woke up immediately after surgery , 2016- took him 30 minutes to wake up   . Hypotension occasional  . Neurogenic bladder    foley and i and o caths  . Neurogenic bowel    bowel program with digital stimulation as needed  . Neuromuscular disorder (HCC)    Quadriplegic uses electric wheelchair, Nurse, adult or Two person Transfer  . Quadriplegic spinal paralysis (HCC) 2009    Past Surgical History:   Procedure Laterality Date  . bicep and tricept tendon transfers bilateral  2011  . bladder botox surgery  2012  . BOTOX INJECTION N/A 03/06/2018   Procedure: BOTOX INJECTION CYSTOSCOPY WITH FULGURATION;  Surgeon: Alfredo Martinez, MD;  Location: [facility];  Service: Urology;  Laterality: N/A;  . CYSTOSCOPY N/A 01/20/2014   Procedure: CYSTOSCOPY WITH BOTOX INJECTION;  Surgeon: Kathi Ludwig, MD;  Location: [facility];  Service: Urology;  Laterality: N/A;  . CYSTOSCOPY N/A 04/09/2015   Procedure: CYSTOSCOPY WITH BOTOX INJECTION;  Surgeon: Jethro Bolus, MD;  Location: [facility];  Service: Urology;  Laterality: N/A;  . CYSTOSCOPY WITH INJECTION  10/11/2012   Procedure: CYSTOSCOPY WITH INJECTION;  Surgeon: Kathi Ludwig, MD;  Location: [facility];  Service: Urology;  Laterality: N/A;  botox  . CYSTOSCOPY WITH INJECTION N/A 04/29/2016   Procedure: CYSTOSCOPY WITH INJECTION;  Surgeon: Jethro Bolus, MD;  Location: [facility];  Service: Urology;  Laterality: N/A;  . 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 His smoking use included cigarettes. He has a 1.00 pack-year smoking history. He has never used smokeless tobacco. He reports that he does not drink alcohol or use drugs.  Allergies  Allergen Reactions  . Bactrim [Sulfamethoxazole-Trimethoprim] Hives    Family History  Problem Relation Age of Onset  . Colon cancer Mother   . Pulmonary embolism Mother   . Hypertension Father   . Congestive Heart Failure Paternal  Grandfather     Prior to Admission medications   Medication Sig Start Date End Date Taking? Authorizing Provider  baclofen (LIORESAL) 20 MG tablet Take 20 mg by mouth 3 (three) times daily.   Yes [provider]  HYDROcodone-acetaminophen (NORCO/VICODIN) 5-325 MG per tablet Take 1 tablet by mouth every 6 (six) hours as needed for moderate pain. Pain   Yes [provider]  Multiple Vitamin  (MULTIVITAMIN WITH MINERALS) TABS tablet Take 1 tablet by mouth daily.   Yes [provider]  oxybutynin (DITROPAN XL) 15 MG 24 hr tablet Take 15 mg by mouth every morning.   Yes [provider]  UNABLE TO FIND Cranberry supplement unknown dose   Yes [provider]  UNABLE TO FIND calcium supplement unknown dose   Yes [provider]    Physical Exam: Vitals:   05/28/18 1430 05/28/18 1437 05/28/18 1544 05/28/18 1630  BP: 128/89 128/89 (!) 98/57 (!) 89/53  Pulse: 66 70 79 75  Resp: 14 19 19 16   Temp:      TempSrc:      SpO2: 98% 98% 95% 96%     Constitutional: NAD  Eyes: PERRL, lids and conjunctivae normal ENMT: Mucous membranes are moist. Posterior pharynx clear of any exudate or lesions. Neck: normal, supple, no masses, no thyromegaly Respiratory: clear to auscultation bilaterally, no wheezing, no crackles. Normal respiratory effort. Cardiovascular: Regular rate and rhythm, no murmurs / rubs / gallops. 2+ pedal pulses. No carotid bruits.  Abdomen: no tenderness, no masses palpated. No hepatosplenomegaly. Bowel sounds positive.  Musculoskeletal: Lower extremity atrophy, upper extremity atrophy. Skin: no rashes, lesions, ulcers. No induration Neurologic: CN 2-12 grossly intact.  Psychiatric: Alert and oriented x 3. Normal mood.   Labs on Admission: I have personally reviewed following labs and imaging studies  CBC: Recent Labs  Lab 05/28/18 1231  WBC 20.0*  NEUTROABS 17.3*  HGB 15.2  HCT 44.4  MCV 87.4  PLT 275   Basic Metabolic Panel: Recent Labs  Lab 05/28/18 1231  NA 144  K 4.0  CL 108  CO2 28  GLUCOSE 109*  BUN 16  CREATININE 0.66  CALCIUM 9.0   GFR: CrCl cannot be calculated (Unknown ideal weight.). Liver Function Tests: No results for input(s): AST, ALT, ALKPHOS, BILITOT, PROT, ALBUMIN in the last 168 hours. No results for input(s): LIPASE, AMYLASE in the last 168 hours. No results for input(s): AMMONIA in the last  168 hours. Coagulation Profile: No results for input(s): INR, PROTIME in the last 168 hours. Cardiac Enzymes: No results for input(s): CKTOTAL, CKMB, CKMBINDEX, TROPONINI in the last 168 hours. BNP (last 3 results) No results for input(s): PROBNP in the last 8760 hours. HbA1C: No results for input(s): HGBA1C in the last 72 hours. CBG: No results for input(s): GLUCAP in the last 168 hours. Lipid Profile: No results for input(s): CHOL, HDL, LDLCALC, TRIG, CHOLHDL, LDLDIRECT in the last 72 hours. Thyroid Function Tests: No results for input(s): TSH, T4TOTAL, FREET4, T3FREE, THYROIDAB in the last 72 hours. Anemia Panel: No results for input(s): VITAMINB12, FOLATE, FERRITIN, TIBC, IRON, RETICCTPCT in the last 72 hours. Urine analysis:    Component Value Date/Time   COLORURINE YELLOW 05/28/2018 1313   APPEARANCEUR TURBID (A) 05/28/2018 1313   LABSPEC 1.016 05/28/2018 1313   PHURINE 8.0 05/28/2018 1313   GLUCOSEU NEGATIVE 05/28/2018 1313   HGBUR SMALL (A) 05/28/2018 1313   BILIRUBINUR NEGATIVE 05/28/2018 1313   KETONESUR 5 (A) 05/28/2018 1313   PROTEINUR >=300 (  A) 05/28/2018 1313   UROBILINOGEN 0.2 09/05/2014 1445   NITRITE NEGATIVE 05/28/2018 1313   LEUKOCYTESUR MODERATE (A) 05/28/2018 1313   Sepsis Labs: !!!!!!!!!!!!!!!!!!!!!!!!!!!!!!!!!!!!!!!!!!!! @LABRCNTIP (procalcitonin:4,lacticidven:4) )No results found for this or any previous visit (from the past 240 hour(s)).   Radiological Exams on Admission: Ct Renal Stone Study  Result Date: 05/28/2018 CLINICAL DATA:  Bladder pain and left flank pain EXAM: CT ABDOMEN AND PELVIS WITHOUT CONTRAST TECHNIQUE: Multidetector CT imaging of the abdomen and pelvis was performed following the standard protocol without IV contrast. COMPARISON:  09/05/2014 FINDINGS: Lower chest: No acute abnormality. Hepatobiliary: No focal liver abnormality is seen. No gallstones, gallbladder wall thickening, or biliary dilatation. Pancreas: Unremarkable. No  pancreatic ductal dilatation or surrounding inflammatory changes. Spleen: Normal in size without focal abnormality. Adrenals/Urinary Tract: Adrenal glands are within normal limits bilaterally. Kidneys show no evidence of renal calculi or obstructive change. Bladder is partially distended despite a Foley catheter in place. Some dependent density is noted within the bladder likely related to the known sediment. Possible small stones could not be totally excluded. Air is noted within the bladder likely related to the recent instrumentation. Stomach/Bowel: Stomach is within normal limits. Appendix appears normal. No evidence of bowel wall thickening, distention, or inflammatory changes. Vascular/Lymphatic: No significant vascular findings are present. No enlarged abdominal or pelvic lymph nodes. Reproductive: Prostate is unremarkable. Other: No abdominal wall hernia or abnormality. No abdominopelvic ascites. Musculoskeletal: Mild degenerative changes of the lumbar spine are seen. IMPRESSION: Dependent density within the bladder which may be related to the known is sediment described within the Foley catheter. Small stones could not be totally excluded. No obstructive changes are seen. No other focal abnormality is noted. Electronically Signed   By: Alcide Clever M.D.   On: 05/28/2018 13:57    EKG: Independently reviewed.   Assessment/Plan UTI - cath associated  Placed on Obs, given low blood pressures, normal lactate  Patient in and out and also, use indwelling cath  Follow-up urine and blood cultures, continue Rocephin De-escalate antibiotics pending sensitivity. Continue IV fluid  Hypotension Likely from infectious process Continue aggressive hydration  C5 quadriplegia Continue baclofen and pain control.   DVT prophylaxis: Lovenox  Code Status: Full Code  Family Communication: None  Disposition Plan: Anticipate discharge to previous home environment.  Consults called: None  Admission status:  Obs/Medsurg    Latrelle Dodrill MD [facility] Pager: Text Page via [email or web address]  [phone]  If 7PM-7AM, please contact night-coverage [email or web address] Password [email or web address]  05/28/2018, 4:55 PM

## 2018-05-28 NOTE — ED Triage Notes (Addendum)
Pt sent from [facility] for hypotension. Pt went to [facility] to have culture of urine and possibly antibiotics. 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 Pt removed catheter and self cathed 700cc of urine. Pt states he feels like he has had a UTI for the past few weeks.   Pt's BP at [facility] was 62/38. Pt states his BP is usually around 90/60.

## 2018-05-28 NOTE — ED Notes (Signed)
Pt assisted to his automatic wheelchair as requested for transport to floor. Pt's wife reported she would feel more comfortable if pt drove his chair as it can be difficult to drive

## 2018-05-28 NOTE — ED Notes (Signed)
ED TO INPATIENT HANDOFF REPORT  Name/Age/Gender Curtis Mann 32 y.o. male  Code Status Code Status History    Date Active Date Inactive Code Status Order ID Comments User Context   12/12/2016 2314 12/16/2016 1654 Full Code [id number]  Edwin Dada, MD Inpatient      Home/SNF/Other Home  Chief Complaint BLADDER INFECTION  Level of Care/Admitting Diagnosis ED Disposition    ED Disposition Condition Comment   Admit  The patient appears reasonably stabilized for admission considering the current resources, flow, and capabilities available in the ED at this time, and I doubt any other [facility] requiring further screening and/or treatment in the ED prior to admission is  present.       Medical History Past Medical History:  Diagnosis Date  . Complication of anesthesia    in 2015 patient states he woke up immediately after surgery , 2016- took him 30 minutes to wake up   . Hypotension occasional  . Neurogenic bladder    foley and i and o caths  . Neurogenic bowel    bowel program with digital stimulation as needed  . Neuromuscular disorder ([place])    Quadriplegic uses electric wheelchair, [employer] or Two person Transfer  . 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 Bactrim [Sulfamethoxazole-Trimethoprim] Hives    IV Location/Drains/Wounds Patient Lines/Drains/Airways Status   Active Line/Drains/Airways    Name:   Placement date:   Placement time:   Site:   Days:   Peripheral IV 12/12/16 Right Wrist   12/12/16    1924    Wrist   532   Peripheral IV 05/28/18 Left Forearm   05/28/18    1227    Forearm   less than 1   Urethral Catheter Dr Gaynelle Arabian Latex 14 Fr.   04/09/15    1127    Latex   1145   Urethral Catheter Herrschaft RN Latex 14 Fr.   04/29/16    1415    Latex   759   Urethral Catheter   12/12/16    -    -   532   Urethral Catheter Dr. Matilde Sprang Latex 16 Fr.   03/06/18    1140    Latex   83   Urethral Catheter  Katiria webster NT+3 Straight-tip 16 Fr.   05/28/18    1224    Straight-tip   less than 1   Incision (Closed) 04/29/16 Perineum Other (Comment)   04/29/16    1242     759          Labs/Imaging Results for orders placed or performed during the hospital encounter of 05/28/18 (from the past 48 hour(s))  CBC with Differential/Platelet     Status: Abnormal   Collection Time: 05/28/18 12:31 PM  Result Value Ref Range   WBC 20.0 (H) 4.0 - 10.5 K/uL   RBC 5.08 4.22 - 5.81 MIL/uL   Hemoglobin 15.2 13.0 - 17.0 g/dL   HCT 44.4 39.0 - 52.0 %   MCV 87.4 78.0 - 100.0 fL   MCH 29.9 26.0 - 34.0 pg   MCHC 34.2 30.0 - 36.0 g/dL   RDW 13.0 11.5 - 15.5 %   Platelets 275 150 - 400 K/uL   Neutrophils Relative % 86 %   Neutro Abs 17.3 (H) 1.7 - 7.7 K/uL   Lymphocytes Relative 6 %   Lymphs Abs 1.1 0.7 - 4.0 K/uL   Monocytes Relative 8 %  Monocytes Absolute 1.5 (H) 0.1 - 1.0 K/uL   Eosinophils Relative 0 %   Eosinophils Absolute 0.0 0.0 - 0.7 K/uL   Basophils Relative 0 %   Basophils Absolute 0.0 0.0 - 0.1 K/uL    Comment: Performed at [facility], [place] [address], [place], [place] [address]  Basic metabolic panel     Status: Abnormal   Collection Time: 05/28/18 12:31 PM  Result Value Ref Range   Sodium 144 135 - 145 mmol/L   Potassium 4.0 3.5 - 5.1 mmol/L   Chloride 108 101 - 111 mmol/L   CO2 28 22 - 32 mmol/L   Glucose, Bld 109 (H) 65 - 99 mg/dL   BUN 16 6 - 20 mg/dL   Creatinine, Ser 0.66 0.61 - 1.24 mg/dL   Calcium 9.0 8.9 - 10.3 mg/dL   GFR calc non Af Amer >60 >60 mL/min   GFR calc Af Amer >60 >60 mL/min    Comment: (NOTE) The eGFR has been calculated using the CKD EPI equation. This calculation has not been validated in all clinical situations. eGFR's persistently <60 mL/min signify possible Chronic Kidney Disease.    Anion gap 8 5 - 15    Comment: Performed at [facility], [place] [address], [place], [place] [address]  Urinalysis, Routine w  reflex microscopic     Status: Abnormal   Collection Time: 05/28/18  1:13 PM  Result Value Ref Range   Color, Urine YELLOW YELLOW   APPearance TURBID (A) CLEAR   Specific Gravity, Urine 1.016 1.005 - 1.030   pH 8.0 5.0 - 8.0   Glucose, UA NEGATIVE NEGATIVE mg/dL   Hgb urine dipstick SMALL (A) NEGATIVE   Bilirubin Urine NEGATIVE NEGATIVE   Ketones, ur 5 (A) NEGATIVE mg/dL   Protein, ur >=300 (A) NEGATIVE mg/dL   Nitrite NEGATIVE NEGATIVE   Leukocytes, UA MODERATE (A) NEGATIVE   RBC / HPF >50 (H) 0 - 5 RBC/hpf   WBC, UA >50 (H) 0 - 5 WBC/hpf   Bacteria, UA MANY (A) NONE SEEN   WBC Clumps PRESENT    Mucus PRESENT     Comment: Performed at [facility], [place] [address], [place], [place] [address]  I-Stat CG4 Lactic Acid, ED     Status: None   Collection Time: 05/28/18  1:22 PM  Result Value Ref Range   Lactic Acid, Venous 1.29 0.5 - 1.9 mmol/L   Ct Renal Stone Study  Result Date: 05/28/2018 CLINICAL DATA:  Bladder pain and left flank pain EXAM: CT ABDOMEN AND PELVIS WITHOUT CONTRAST TECHNIQUE: Multidetector CT imaging of the abdomen and pelvis was performed following the standard protocol without IV contrast. COMPARISON:  09/05/2014 FINDINGS: Lower chest: No acute abnormality. Hepatobiliary: No focal liver abnormality is seen. No gallstones, gallbladder wall thickening, or biliary dilatation. Pancreas: Unremarkable. No pancreatic ductal dilatation or surrounding inflammatory changes. Spleen: Normal in size without focal abnormality. Adrenals/Urinary Tract: Adrenal glands are within normal limits bilaterally. Kidneys show no evidence of renal calculi or obstructive change. Bladder is partially distended despite a Foley catheter in place. Some dependent density is noted within the bladder likely related to the known sediment. Possible small stones could not be totally excluded. Air is noted within the bladder likely related to the recent instrumentation. Stomach/Bowel: Stomach  is within normal limits. Appendix appears normal. No evidence of bowel wall thickening, distention, or inflammatory changes. Vascular/Lymphatic: No significant vascular findings are present. No enlarged abdominal or pelvic lymph nodes.  Reproductive: Prostate is unremarkable. Other: No abdominal wall hernia or abnormality. No abdominopelvic ascites. Musculoskeletal: Mild degenerative changes of the lumbar spine are seen. IMPRESSION: Dependent density within the bladder which may be related to the known is sediment described within the Foley catheter. Small stones could not be totally excluded. No obstructive changes are seen. No other focal abnormality is noted. Electronically Signed   By: Inez Catalina M.D.   On: 05/28/2018 13:57    Pending Labs Unresulted Labs (From admission, onward)   Start     Ordered   05/28/18 1254  Culture, blood (Routine X 2) w Reflex to ID Panel  BLOOD CULTURE X 2,   R     05/28/18 1254   05/28/18 1152  Urine Culture  STAT,   STAT     05/28/18 1151      Vitals/Pain Today's Vitals   05/28/18 1230 05/28/18 1357 05/28/18 1430 05/28/18 1437  BP: (!) 88/59   128/89  Pulse: 77   70  Resp: 17   19  Temp:      TempSrc:      SpO2: 98%   98%  PainSc:  9  8      Isolation Precautions No active isolations  Medications Medications  0.9 %  sodium chloride infusion ( Intravenous Stopped 05/28/18 1446)  cefTRIAXone (ROCEPHIN) 1 g in sodium chloride 0.9 % 100 mL IVPB (1 g Intravenous New Bag/Given 05/28/18 1541)  morphine 4 MG/ML injection 4 mg (has no administration in time range)  morphine 2 MG/ML injection 2 mg (2 mg Intravenous Given 05/28/18 1358)  baclofen (LIORESAL) tablet 20 mg (20 mg Oral Given 05/28/18 1457)    Mobility power wheelchair

## 2018-05-28 NOTE — ED Provider Notes (Signed)
[facility]-EMERGENCY DEPT Provider Note   CSN: [id number] Arrival date & time: 05/28/18  1107     History   Chief Complaint Chief Complaint  Patient presents with  . Hypotension  . Urinary Tract Infection    HPI Curtis Mann is a 32 y.o. male.  32 year old male with history of spinal cord injury presents from urology office due to having low blood pressure.  Patient states that throughout the evening last night he had an episode of autonomic dysreflexia was presented as hypertension.  He says usually is due to have a bowel or bladder issue.  He felt his bladder was distended and he went to urology and they drained 700 cc of urine.  Was also found to be hypotensive.  He has a baseline history of hypertension since his blood pressure runs around 90/60.  Patient denies any recent history of fever or vomiting.  No change in his bowel function.  No recent medication changes.  Have a chronic indwelling Foley.     Past Medical History:  Diagnosis Date  . Complication of anesthesia    in 2015 patient states he woke up immediately after surgery , 2016- took him 30 minutes to wake up   . Hypotension occasional  . Neurogenic bladder    foley and i and o caths  . Neurogenic bowel    bowel program with digital stimulation as needed  . Neuromuscular disorder (H[facility])    Quadriplegic uses electric wheelchair, Nurse, adultHoyer Lift or Two person Transfer  . Quadriplegic spinal paralysis [facility](HCC) 2009    Patient Active Problem List   Diagnosis Date Noted  . Leukocytosis 12/13/2016  . Community acquired pneumonia of right lower lobe of lung (HCC)   . Quadriplegia (HCC)   . 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 CAP (community acquired pneumonia) 12/12/2016    Past Surgical History:  Procedure Laterality Date  . bicep and tricept tendon transfers bilateral  2011  . bladder botox surgery  2012  . BOTOX INJECTION N/A 03/06/2018   Procedure: BOTOX INJECTION CYSTOSCOPY WITH  FULGURATION;  Surgeon: Alfredo MartinezMacDiarmid, Scott, MD;  Location: [facility];  Service: Urology;  Laterality: N/A;  . CYSTOSCOPY N/A 01/20/2014   Procedure: CYSTOSCOPY WITH BOTOX INJECTION;  Surgeon: Kathi LudwigSigmund I Tannenbaum, MD;  Location: [facility];  Service: Urology;  Laterality: N/A;  . CYSTOSCOPY N/A 04/09/2015   Procedure: CYSTOSCOPY WITH BOTOX INJECTION;  Surgeon: Jethro BolusSigmund Tannenbaum, MD;  Location: [facility];  Service: Urology;  Laterality: N/A;  . CYSTOSCOPY WITH INJECTION  10/11/2012   Procedure: CYSTOSCOPY WITH INJECTION;  Surgeon: Kathi LudwigSigmund I Tannenbaum, MD;  Location: [facility];  Service: Urology;  Laterality: N/A;  botox  . CYSTOSCOPY WITH INJECTION N/A 04/29/2016   Procedure: CYSTOSCOPY WITH INJECTION;  Surgeon: Jethro BolusSigmund Tannenbaum, MD;  Location: [facility];  Service: Urology;  Laterality: N/A;  . surgery for spinal cord injury C 4 to C 6  2009        Home Medications    Prior to Admission medications   Medication Sig Start Date End Date Taking? Authorizing Provider  baclofen (LIORESAL) 20 MG tablet Take 20 mg by mouth 3 (three) times daily.    [provider]  HYDROcodone-acetaminophen (NORCO/VICODIN) 5-325 MG per tablet Take 1 tablet by mouth every 6 (six) hours as needed for moderate pain. Pain    [provider]  Multiple Vitamin (MULTIVITAMIN WITH MINERALS) TABS tablet Take 1 tablet by mouth daily.    [provider]  oxybutynin (DITROPAN XL)  15 MG 24 hr tablet Take 15 mg by mouth every morning.    [provider]  UNABLE TO FIND Cranberry supplement unknown dose    [provider]  UNABLE TO FIND calcium supplement unknown dose    [provider]    Family History Family History  Problem Relation Age of Onset  . Colon cancer Mother   . Pulmonary embolism Mother   . Hypertension Father   . Congestive Heart Failure Paternal Grandfather     Social History Social History   Tobacco Use  . Smoking status: Former Smoker    Packs/day: 0.25     Years: 4.00    Pack years: 1.00    Types: Cigarettes    Last attempt to quit: 12/13/2007    Years since quitting: 10.4  . Smokeless tobacco: Never Used  Substance Use Topics  . Alcohol use: No  . Drug use: No     Allergies   Bactrim [sulfamethoxazole-trimethoprim]   Review of Systems Review of Systems  All other systems reviewed and are negative.    Physical Exam Updated Vital Signs BP (!) 84/50 (BP Location: Left Arm)   Pulse 72   Temp 98.3 F (36.8 C) (Oral)   Resp 18   SpO2 99%   Physical Exam  Constitutional: He is oriented to person, place, and time. He appears well-developed and well-nourished.  Non-toxic appearance. No distress.  HENT:  Head: Normocephalic and atraumatic.  Eyes: Pupils are equal, round, and reactive to light. Conjunctivae, EOM and lids are normal.  Neck: Normal range of motion. Neck supple. No tracheal deviation present. No thyroid mass present.  Cardiovascular: Normal rate, regular rhythm and normal heart sounds. Exam reveals no gallop.  No murmur heard. Pulmonary/Chest: Effort normal and breath sounds normal. No stridor. No respiratory distress. He has no decreased breath sounds. He has no wheezes. He has no rhonchi. He has no rales.  Abdominal: Soft. Normal appearance and bowel sounds are normal. He exhibits no distension. There is no tenderness. There is no rigidity, no rebound, no guarding and no CVA tenderness.  Musculoskeletal: Normal range of motion. He exhibits no edema or tenderness.  Neurological: He is alert and oriented to person, place, and time. He displays atrophy. No cranial nerve deficit. He exhibits normal muscle tone. GCS eye subscore is 4. GCS verbal subscore is 5. GCS motor subscore is 6.  Skin: Skin is warm and dry. No abrasion and no rash noted.  Psychiatric: He has a normal mood and affect. His speech is normal and behavior is normal.  Nursing note and vitals reviewed.    ED Treatments / Results  Labs (all labs ordered  are listed, but only abnormal results are displayed) Labs Reviewed  URINE CULTURE  CBC WITH DIFFERENTIAL/PLATELET  BASIC METABOLIC PANEL  URINALYSIS, ROUTINE W REFLEX MICROSCOPIC  I-STAT CG4 LACTIC ACID, ED    EKG None  Radiology No results found.  Procedures Procedures (including critical care time)  Medications Ordered in ED Medications  0.9 %  sodium chloride infusion (has no administration in time range)     Initial Impression / Assessment and Plan / ED Course  I have reviewed the triage vital signs and the nursing notes.  Pertinent labs & imaging results that were available during my care of the patient were reviewed by me and considered in my medical decision making (see chart for details).     Patient here with labile blood pressures but normal lactate.  This is normally  blood pressures around in the 90s.  Here he has been anywhere from the 60s up to 100 most recent blood pressure was 85.  He has a leukocytosis with white count of 20,000.  He also has evidence of UTI.  CT renal study without evidence of obstruction.  Will start patient on Rocephin and admit to the hospitalist service.  Final Clinical Impressions(s) / ED Diagnoses   Final diagnoses:  None    ED Discharge Orders    None       Lorre Nick, MD 05/28/18 1534

## 2018-05-28 NOTE — ED Notes (Signed)
RN and RN Kelly irrigated foley with 30ml of sterile water. Return noted was thick and mucus like. Pt reports relief. When pt was previously bladder scanned, he had 125 mls in bladder.

## 2018-05-29 DIAGNOSIS — I9589 Other hypotension: Secondary | ICD-10-CM | POA: Diagnosis not present

## 2018-05-29 DIAGNOSIS — E861 Hypovolemia: Secondary | ICD-10-CM

## 2018-05-29 DIAGNOSIS — T83511A Infection and inflammatory reaction due to indwelling urethral catheter, initial encounter: Secondary | ICD-10-CM | POA: Diagnosis not present

## 2018-05-29 DIAGNOSIS — N390 Urinary tract infection, site not specified: Secondary | ICD-10-CM | POA: Diagnosis not present

## 2018-05-29 LAB — COMPREHENSIVE METABOLIC PANEL
ALT: 12 U/L — ABNORMAL LOW (ref 17–63)
AST: 21 U/L (ref 15–41)
Albumin: 3.3 g/dL — ABNORMAL LOW (ref 3.5–5.0)
Alkaline Phosphatase: 36 U/L — ABNORMAL LOW (ref 38–126)
Anion gap: 6 (ref 5–15)
BILIRUBIN TOTAL: 0.4 mg/dL (ref 0.3–1.2)
BUN: 8 mg/dL (ref 6–20)
CHLORIDE: 112 mmol/L — AB (ref 101–111)
CO2: 26 mmol/L (ref 22–32)
CREATININE: 0.5 mg/dL — AB (ref 0.61–1.24)
Calcium: 8 mg/dL — ABNORMAL LOW (ref 8.9–10.3)
Glucose, Bld: 96 mg/dL (ref 65–99)
POTASSIUM: 3.5 mmol/L (ref 3.5–5.1)
Sodium: 144 mmol/L (ref 135–145)
Total Protein: 5.7 g/dL — ABNORMAL LOW (ref 6.5–8.1)

## 2018-05-29 LAB — CBC
HEMATOCRIT: 37 % — AB (ref 39.0–52.0)
Hemoglobin: 12.3 g/dL — ABNORMAL LOW (ref 13.0–17.0)
MCH: 29.5 pg (ref 26.0–34.0)
MCHC: 33.2 g/dL (ref 30.0–36.0)
MCV: 88.7 fL (ref 78.0–100.0)
PLATELETS: 222 10*3/uL (ref 150–400)
RBC: 4.17 MIL/uL — AB (ref 4.22–5.81)
RDW: 13.3 % (ref 11.5–15.5)
WBC: 8.2 10*3/uL (ref 4.0–10.5)

## 2018-05-29 LAB — HIV ANTIBODY (ROUTINE TESTING W REFLEX): HIV SCREEN 4TH GENERATION: NONREACTIVE

## 2018-05-29 MED ORDER — CEFPODOXIME PROXETIL 200 MG PO TABS: 200.0000 mg | ORAL_TABLET | Freq: Two times a day (BID) | ORAL | 0 refills | Status: AC

## 2018-05-29 NOTE — Progress Notes (Signed)
Pt discharged to home with wife, instructions reviewed with pt and spouse, acknowledged understanding. SRP, RN

## 2018-05-29 NOTE — Progress Notes (Signed)
Pt discharged to home, instructions reviewed, acknowledged understanding. Reviewed foley care with pt and wife. SRP, RN

## 2018-05-29 NOTE — Discharge Summary (Signed)
Physician Discharge Summary  Curtis Mann  ZOX:[id number]  DOB: 29-Apr-1986  DOA: 05/28/2018 PCP: Florentina Jenny, MD  Admit date: 05/28/2018 Discharge date: 05/29/2018  Admitted From: Home  Disposition: Home  Recommendations for Outpatient Follow-up:  1. Follow up with PCP in 1 week  2. Please obtain BMP/CBC in one week monitor WBC and renal function 3. Please follow up on the following pending results: Final urine and blood cultures   Discharge Condition: Stable   CODE STATUS: Full Code  Diet recommendation: Heart Healthy   Brief/Interim Summary: For full details see H&P/Progress note, but in brief, Curtis Mann is a 32 year old male with medical history significant for C5 quadriplegia and chronic urinary retention who presented to the emergency department after being found hypotensive at his urology office.  Upon ED evaluation BP was in the 80s/50s and WBC of 20.  UA grossly abnormal.  Patient was placed in observation UTI complicated by hypotension.  She was started on Rocephin and was given IV fluids.  Subsequently blood pressure normalized, urine was clear and patient clinically improved.  Patient has remained afebrile and was deemed stable for discharge to follow-up with PCP.   Subjective: Patient seen and examined, he reports some bladder spasm today that resolved with baclofen.  No other concerns.  Tolerating diet well.  Remains afebrile.  Discharge Diagnoses/Hospital Course:  UTI - cath associated  Treated with Rocephin x2 doses, will discharge on Vantin for 7 days. Follow-up urine cultures and sensitivities  Keep foley in place until seen by Urology   Hypotension Resolved with hydration   C5 quadriplegia Stable, continue baclofen and pain control.  On the day of the discharge the patient's vitals were stable, and no other acute medical condition were reported by patient. the patient was felt safe to be discharge to home.   Discharge Instructions  You were cared for by  a hospitalist during your hospital stay. If you have any questions about your discharge medications or the care you received while you were in the hospital after you are discharged, you can call the unit and asked to speak with the hospitalist on call if the hospitalist that took care of you is not available. Once you are discharged, your primary care physician will handle any further medical issues. Please note that NO REFILLS for any discharge medications will be authorized once you are discharged, as it is imperative that you return to your primary care physician (or establish a relationship with a primary care physician if you do not have one) for your aftercare needs so that they can reassess your need for medications and monitor your lab values.  Discharge Instructions    Call MD for:  difficulty breathing, headache or visual disturbances   Complete by:  As directed    Call MD for:  extreme fatigue   Complete by:  As directed    Call MD for:  hives   Complete by:  As directed    Call MD for:  persistant dizziness or light-headedness   Complete by:  As directed    Call MD for:  persistant nausea and vomiting   Complete by:  As directed    Call MD for:  redness, tenderness, or signs of infection (pain, swelling, redness, odor or green/yellow discharge around incision site)   Complete by:  As directed    Call MD for:  severe uncontrolled pain   Complete by:  As directed    Call MD for:  temperature >100.4  Complete by:  As directed    Diet - low sodium heart healthy   Complete by:  As directed    Increase activity slowly   Complete by:  As directed      Allergies as of 05/29/2018      Reactions   Bactrim [sulfamethoxazole-trimethoprim] Hives      Medication List    TAKE these medications   baclofen 20 MG tablet Commonly known as:  LIORESAL Take 20 mg by mouth 3 (three) times daily.   cefpodoxime 200 MG tablet Commonly known as:  VANTIN Take 1 tablet (200 mg total) by mouth 2  (two) times daily for 7 days.   HYDROcodone-acetaminophen 5-325 MG tablet Commonly known as:  NORCO/VICODIN Take 1 tablet by mouth every 6 (six) hours as needed for moderate pain. Pain   multivitamin with minerals Tabs tablet Take 1 tablet by mouth daily.   oxybutynin 15 MG 24 hr tablet Commonly known as:  DITROPAN XL Take 15 mg by mouth every morning.   UNABLE TO FIND Cranberry supplement unknown dose   UNABLE TO FIND calcium supplement unknown dose      Follow-up Information    Florentina Jennyripp, Henry, MD. Schedule an appointment as soon as possible for a visit in 1 week(s).   Specialty:  Family Medicine Why:  Hospital follow up  Contact information: [address] [place] [address] [phone]        Crista ElliotBell, Eugene D III, MD. Schedule an appointment as soon as possible for a visit in 1 week(s).   Specialty:  Urology Why:  Hospital follow up Contact information: [address]  [place] [phone]          Allergies  Allergen Reactions  . Bactrim [Sulfamethoxazole-Trimethoprim] Hives    Consultations:  None   Procedures/Studies: Ct Renal Stone Study  Result Date: 05/28/2018 CLINICAL DATA:  Bladder pain and left flank pain EXAM: CT ABDOMEN AND PELVIS WITHOUT CONTRAST TECHNIQUE: Multidetector CT imaging of the abdomen and pelvis was performed following the standard protocol without IV contrast. COMPARISON:  09/05/2014 FINDINGS: Lower chest: No acute abnormality. Hepatobiliary: No focal liver abnormality is seen. No gallstones, gallbladder wall thickening, or biliary dilatation. Pancreas: Unremarkable. No pancreatic ductal dilatation or surrounding inflammatory changes. Spleen: Normal in size without focal abnormality. Adrenals/Urinary Tract: Adrenal glands are within normal limits bilaterally. Kidneys show no evidence of renal calculi or obstructive change. Bladder is partially distended despite a Foley catheter in place. Some dependent  density is noted within the bladder likely related to the known sediment. Possible small stones could not be totally excluded. Air is noted within the bladder likely related to the recent instrumentation. Stomach/Bowel: Stomach is within normal limits. Appendix appears normal. No evidence of bowel wall thickening, distention, or inflammatory changes. Vascular/Lymphatic: No significant vascular findings are present. No enlarged abdominal or pelvic lymph nodes. Reproductive: Prostate is unremarkable. Other: No abdominal wall hernia or abnormality. No abdominopelvic ascites. Musculoskeletal: Mild degenerative changes of the lumbar spine are seen. IMPRESSION: Dependent density within the bladder which may be related to the known is sediment described within the Foley catheter. Small stones could not be totally excluded. No obstructive changes are seen. No other focal abnormality is noted. Electronically Signed   By: Alcide CleverMark  Lukens M.D.   On: 05/28/2018 13:57    Discharge Exam: Vitals:   05/29/18 0204 05/29/18 0418  BP:  112/72  Pulse:  85  Resp:  16  Temp: 98.8 F (37.1 C)  98.8 F (37.1 C)  SpO2:  95%   Vitals:   05/28/18 1911 05/28/18 2135 05/29/18 0204 05/29/18 0418  BP:  109/70  112/72  Pulse:  81  85  Resp:  16  16  Temp:  98.7 F (37.1 C) 98.8 F (37.1 C) 98.8 F (37.1 C)  TempSrc:  Oral Oral Oral  SpO2:  96%  95%  Weight: 65.5 kg (144 lb 6.4 oz)     Height: 6' (1.829 m)       General: Pt is alert, awake, not in acute distress Cardiovascular: RRR, S1/S2 +, no rubs, no gallops Respiratory: CTA bilaterally, no wheezing, no rhonchi Abdominal: Soft, NT, ND, bowel sounds + GU: Foley in place, urine clear  Extremities: Lowe and upper extremity atrophy   The results of significant diagnostics from this hospitalization (including imaging, microbiology, ancillary and laboratory) are listed below for reference.     Microbiology: No results found for this or any previous visit (from the  past 240 hour(s)).   Labs: BNP (last 3 results) No results for input(s): BNP in the last 8760 hours. Basic Metabolic Panel: Recent Labs  Lab 05/28/18 1231 05/29/18 0433  NA 144 144  K 4.0 3.5  CL 108 112*  CO2 28 26  GLUCOSE 109* 96  BUN 16 8  CREATININE 0.66 0.50*  CALCIUM 9.0 8.0*   Liver Function Tests: Recent Labs  Lab 05/29/18 0433  AST 21  ALT 12*  ALKPHOS 36*  BILITOT 0.4  PROT 5.7*  ALBUMIN 3.3*   No results for input(s): LIPASE, AMYLASE in the last 168 hours. No results for input(s): AMMONIA in the last 168 hours. CBC: Recent Labs  Lab 05/28/18 1231 05/29/18 0433  WBC 20.0* 8.2  NEUTROABS 17.3*  --   HGB 15.2 12.3*  HCT 44.4 37.0*  MCV 87.4 88.7  PLT 275 222   Cardiac Enzymes: No results for input(s): CKTOTAL, CKMB, CKMBINDEX, TROPONINI in the last 168 hours. BNP: Invalid input(s): POCBNP CBG: No results for input(s): GLUCAP in the last 168 hours. D-Dimer No results for input(s): DDIMER in the last 72 hours. Hgb A1c No results for input(s): HGBA1C in the last 72 hours. Lipid Profile No results for input(s): CHOL, HDL, LDLCALC, TRIG, CHOLHDL, LDLDIRECT in the last 72 hours. Thyroid function studies No results for input(s): TSH, T4TOTAL, T3FREE, THYROIDAB in the last 72 hours.  Invalid input(s): FREET3 Anemia work up No results for input(s): VITAMINB12, FOLATE, FERRITIN, TIBC, IRON, RETICCTPCT in the last 72 hours. Urinalysis    Component Value Date/Time   COLORURINE YELLOW 05/28/2018 1313   APPEARANCEUR TURBID (A) 05/28/2018 1313   LABSPEC 1.016 05/28/2018 1313   PHURINE 8.0 05/28/2018 1313   GLUCOSEU NEGATIVE 05/28/2018 1313   HGBUR SMALL (A) 05/28/2018 1313   BILIRUBINUR NEGATIVE 05/28/2018 1313   KETONESUR 5 (A) 05/28/2018 1313   PROTEINUR >=300 (A) 05/28/2018 1313   UROBILINOGEN 0.2 09/05/2014 1445   NITRITE NEGATIVE 05/28/2018 1313   LEUKOCYTESUR MODERATE (A) 05/28/2018 1313   Sepsis Labs Invalid input(s): PROCALCITONIN,   WBC,  LACTICIDVEN Microbiology No results found for this or any previous visit (from the past 240 hour(s)).  Time coordinating discharge: 32 minutes  SIGNED:  Latrelle Dodrill, MD  [facility] 05/29/2018, 1:23 PM  Pager please text page via  [email or web address]  Note - This record has been created using [employer]. Chart creation errors have been sought, but may not always have been located. Such creation errors do not reflect on the standard  of medical care.

## 2018-05-29 NOTE — Care Management Note (Signed)
Case Management Note  Patient Details  Name: Curtis Mann MRN: [id number] Date of Birth: 1986-08-16  Subjective/Objective:                    Action/Plan:   Expected Discharge Date:  05/29/18               Expected Discharge Plan:  Home/Self Care  In-House Referral:     Discharge planning Services  CM Consult  Post Acute Care Choice:    Choice offered to:  Patient  DME Arranged:    DME Agency:     HH Arranged:    HH Agency:     Status of Service:  Completed, signed off  If discussed at [employer] Length of Stay Meetings, dates discussed:    Additional CommentsGeni Bers:  Curtis Parslow, RN 05/29/2018, 2:29 PM

## 2018-05-30 LAB — URINE CULTURE: Culture: >=100000 — AB

## 2018-06-02 LAB — CULTURE, BLOOD (ROUTINE X 2)
CULTURE: NO GROWTH
Culture: NO GROWTH

## 2018-08-02 DIAGNOSIS — N312 Flaccid neuropathic bladder, not elsewhere classified: Secondary | ICD-10-CM | POA: Diagnosis not present

## 2018-08-02 DIAGNOSIS — N3020 Other chronic cystitis without hematuria: Secondary | ICD-10-CM | POA: Diagnosis not present

## 2018-10-25 DIAGNOSIS — Z79899 Other long term (current) drug therapy: Secondary | ICD-10-CM | POA: Diagnosis not present

## 2018-12-20 DIAGNOSIS — N3020 Other chronic cystitis without hematuria: Secondary | ICD-10-CM | POA: Diagnosis not present

## 2018-12-20 DIAGNOSIS — R109 Unspecified abdominal pain: Secondary | ICD-10-CM | POA: Diagnosis not present

## 2018-12-20 DIAGNOSIS — R8279 Other abnormal findings on microbiological examination of urine: Secondary | ICD-10-CM | POA: Diagnosis not present

## 2018-12-20 DIAGNOSIS — N312 Flaccid neuropathic bladder, not elsewhere classified: Secondary | ICD-10-CM | POA: Diagnosis not present

## 2019-03-08 DIAGNOSIS — N3020 Other chronic cystitis without hematuria: Secondary | ICD-10-CM | POA: Diagnosis not present

## 2019-03-08 DIAGNOSIS — N3942 Incontinence without sensory awareness: Secondary | ICD-10-CM | POA: Diagnosis not present

## 2019-04-12 DIAGNOSIS — Z20828 Contact with and (suspected) exposure to other viral communicable diseases: Secondary | ICD-10-CM | POA: Diagnosis not present

## 2019-04-19 DIAGNOSIS — Z0183 Encounter for blood typing: Secondary | ICD-10-CM | POA: Diagnosis not present

## 2019-04-24 ENCOUNTER — Other Ambulatory Visit: Payer: Self-pay | Admitting: Urology

## 2019-05-07 ENCOUNTER — Encounter (HOSPITAL_COMMUNITY): Admission: RE | Admit: 2019-05-07 | Payer: Medicare Other | Source: Ambulatory Visit

## 2019-05-07 ENCOUNTER — Other Ambulatory Visit: Payer: Self-pay

## 2019-05-07 ENCOUNTER — Encounter (HOSPITAL_COMMUNITY): Payer: Self-pay | Admitting: *Deleted

## 2019-05-07 DIAGNOSIS — R8279 Other abnormal findings on microbiological examination of urine: Secondary | ICD-10-CM | POA: Diagnosis not present

## 2019-05-12 NOTE — H&P (Signed)
The patient is a C5 quadriplegic followed by Dr. Karie Schwalbe. I believe he has had 3 Botox the last being April 2017 and 200 units is utilized. After Botox he catheterizes 40 8 times a day and is dry for about 4 months. Then he starts leaking and will go back on oxybutynin. Other times when he leaks a lot he put in a Foley catheter for up to a week and then takes oxybutynin. He has a catheter now. He generally does not get a lot of bladder infections.   The patient will be treated under general anesthesia with 200 units. He understands my culture routine especially in someone that could be chronically colonized. We will proceed accordingly   He understands that in my opinion he does not require repeat urodynamics but in the future I will be keeping an eye on his kidneys with a renal ultrasound   TOday  On March 06, 2018 he had Botox 200 units under general anesthesia- He had come in with the Foley catheter and actually used the Bugbee electrode for 2 small bleeders post treatment  cipro rx given for 5 days 250 mg bid for 5 days  in June he saw a nurse practitioner with abdominal discomfort and sediment in urine  he then come back with August and had been treated by primary care with Bactrim and had a reaction. She also had restarted his oxybutynin.  At least 1 of the cultures was positive. He then came in with some Foley catheter trauma change the catheter with the balloon but the culture was negative.   patient I spoke about 3 issues. He would like to have another Botox 200 units. We will mail him a culture container and we will get it 10 days prior by his family delivery.   Currently he is having some burning foul-smelling urine and more incontinence and he will bring in a culture almost immediately since were mainly him a 2nd 1 and we will call with culture results and treat appropriately. He is not febrile and he is trying not to use the Foley catheter if able   He wants oxybutynin 15 mg ER twice a day  sent to pharmacy since this in the past has worked the best   We went through the pros cons and risks of a cystectomy in detail and the usual protocol post vasectomy and he wants to think about it and in the future may have it under general anesthesia so he does not trigger a neurogenic response which should be less common but possible   There is no other aggravating or relieving factors  There is no other associated signs and symptoms  The severity of the symptoms is moderate  The symptoms are ongoing and bothersome         ALLERGIES: Amoxicillin Bactrim - Hives Ciprofloxacin - Other Reaction, Per Pt Sulfa Sulfamethoxazole-Trimethoprim    MEDICATIONS: Baclofen 20 mg tablet Oral  Cranberry  Hydrocodone-Acetaminophen 5 mg-325 mg tablet Oral     GU PSH: Cystoscopy - 2010 Cystourethroscopy, W/Injection For Chemodenervation Of Bladder - 03/06/2018, 2017 Urethrolysis - 2016, 2015, 2013      PSH Notes: Cystoscopy With Injection For Chemodenervation Of Bladder, Urologic Surgery, Urologic Surgery, Urologic Surgery, Cystoscopy (Diagnostic), Cervical Vertebral Fusion   NON-GU PSH: Neck Spine Fusion - 2010    GU PMH: 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 Other Disorders Of Bladder, Bladder spasms - 2017 Incontinence w/o Sensation, Urinary incontinence without sensory awareness - 2017 Urge incontinence, Urge incontinence of  urine - 2017 Urinary incontinence, Unspec, Urinary incontinence - 2016 Abdominal Pain Unspec, Left flank pain - 2015, Abdominal pain, - 2014 Chronic cystitis (w/o hematuria), Chronic cystitis - 2015 Acute Cystitis/UTI, Acute cystitis without hematuria - 2014      PMH Notes:  2009-04-28 13:14:35 - Note: No Medical Problems   NON-GU PMH: Hypotension, unspecified - 05/28/2018 Autonomic dysreflexia, 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, C5-C7 incomplete, Quadriplegia, C5-C7, incomplete - 2016 Unspecified injury at C7 level of cervical spinal cord, initial encounter,  Closed fracture of C5-C7 level with spinal cord injury - 2015 Encounter for general adult medical examination without abnormal findings, Encounter for preventive health examination - 2015 Candidal stomatitis, Oral thrush - 2014    FAMILY HISTORY: Colon Cancer - Mother Family Health Status - Father alive at age 59 - Runs In Family Mother Deceased At Age 8 from diabetic complicati - Runs In Family Prostate Cancer - Runs In Family   SOCIAL HISTORY: Marital Status: Single Preferred Language: English; Ethnicity: Not Hispanic Or Latino; Race: White Current Smoking Status: Patient has never smoked.   Tobacco Use Assessment Completed: Used Tobacco in last 30 days? Does not use smokeless tobacco. Social Drinker.  Does not use drugs. Drinks 2 caffeinated drinks per day.     Notes: Married, Marital History - Single, Caffeine Use, Occupation:, Previous History Of Smoking, Alcohol Use   REVIEW OF SYSTEMS:    GU Review Male:   Patient denies frequent urination, hard to postpone urination, burning/ pain with urination, get up at night to urinate, leakage of urine, stream starts and stops, trouble starting your stream, have to strain to urinate , erection problems, and penile pain.  Gastrointestinal (Upper):   Patient denies nausea, vomiting, and indigestion/ heartburn.  Gastrointestinal (Lower):   Patient denies diarrhea and constipation.  Constitutional:   Patient denies fever, night sweats, weight loss, and fatigue.  Skin:   Patient denies skin rash/ lesion and itching.  Eyes:   Patient denies blurred vision and double vision.  Ears/ Nose/ Throat:   Patient denies sore throat and sinus problems.  Hematologic/Lymphatic:   Patient denies easy bruising and swollen glands.  Cardiovascular:   Patient denies leg swelling and chest pains.  Respiratory:   Patient denies cough and shortness of breath.  Endocrine:   Patient denies excessive thirst.  Musculoskeletal:   Patient denies back pain and joint  pain.  Neurological:   Patient denies headaches and dizziness.  Psychologic:   Patient denies depression and anxiety.   PAST DATA REVIEWED:  Source Of History:  Patient   PROCEDURES:     After a thorough review of the management options for the patient's condition the patient  elected to proceed with surgical therapy as noted above. We have discussed the potential benefits and risks of the procedure, side effects of the proposed treatment, the likelihood of the patient achieving the goals of the procedure, and any potential problems that might occur during the procedure or recuperation. Informed consent has been obtained. ________________________________________________________

## 2019-05-13 ENCOUNTER — Other Ambulatory Visit (HOSPITAL_COMMUNITY)
Admission: RE | Admit: 2019-05-13 | Discharge: 2019-05-13 | Disposition: A | Discharge status: Home / Self Care | Payer: Medicare Other | Source: Ambulatory Visit | Attending: Urology | Admitting: Urology

## 2019-05-13 ENCOUNTER — Other Ambulatory Visit: Payer: Self-pay | Admitting: Urology

## 2019-05-13 ENCOUNTER — Other Ambulatory Visit (HOSPITAL_COMMUNITY): Payer: Medicare Other

## 2019-05-13 DIAGNOSIS — Z1159 Encounter for screening for other viral diseases: Secondary | ICD-10-CM | POA: Insufficient documentation

## 2019-05-13 LAB — SARS CORONAVIRUS 2 BY RT PCR (HOSPITAL ORDER, PERFORMED IN ~~LOC~~ HOSPITAL LAB): SARS Coronavirus 2: NEGATIVE

## 2019-05-13 NOTE — Progress Notes (Signed)
SPOKE W/  Voicemail left requesting call back if patient answers yes to any of the questions below.      SCREENING SYMPTOMS OF COVID 19:   COUGH--  RUNNY NOSE---   SORE THROAT---  NASAL CONGESTION----  SNEEZING----  SHORTNESS OF BREATH---  DIFFICULTY BREATHING---  TEMP >100.0 -----  UNEXPLAINED BODY ACHES------  CHILLS --------   HEADACHES ---------  LOSS OF SMELL/ TASTE --------    HAVE YOU OR ANY FAMILY MEMBER TRAVELLED PAST 14 DAYS OUT OF THE   COUNTY--- STATE---- COUNTRY----  HAVE YOU OR ANY FAMILY MEMBER BEEN EXPOSED TO ANYONE WITH COVID 19?

## 2019-05-14 ENCOUNTER — Encounter (HOSPITAL_COMMUNITY): Payer: Self-pay | Admitting: Emergency Medicine

## 2019-05-14 ENCOUNTER — Ambulatory Visit (HOSPITAL_COMMUNITY): Payer: Medicare Other | Admitting: Anesthesiology

## 2019-05-14 ENCOUNTER — Encounter (HOSPITAL_COMMUNITY)
Admission: RE | Disposition: A | Discharge status: Home / Self Care | Payer: Self-pay | Source: Home / Self Care | Attending: Urology

## 2019-05-14 ENCOUNTER — Ambulatory Visit (HOSPITAL_COMMUNITY)
Admission: RE | Admit: 2019-05-14 | Discharge: 2019-05-14 | Disposition: A | Discharge status: Home / Self Care | Payer: Medicare Other | Attending: Urology | Admitting: Urology

## 2019-05-14 ENCOUNTER — Other Ambulatory Visit: Payer: Self-pay

## 2019-05-14 DIAGNOSIS — N318 Other neuromuscular dysfunction of bladder: Secondary | ICD-10-CM | POA: Insufficient documentation

## 2019-05-14 DIAGNOSIS — G8250 Quadriplegia, unspecified: Secondary | ICD-10-CM | POA: Insufficient documentation

## 2019-05-14 DIAGNOSIS — Z881 Allergy status to other antibiotic agents status: Secondary | ICD-10-CM | POA: Diagnosis not present

## 2019-05-14 DIAGNOSIS — Z79899 Other long term (current) drug therapy: Secondary | ICD-10-CM | POA: Diagnosis not present

## 2019-05-14 DIAGNOSIS — Z302 Encounter for sterilization: Secondary | ICD-10-CM | POA: Insufficient documentation

## 2019-05-14 DIAGNOSIS — N3941 Urge incontinence: Secondary | ICD-10-CM | POA: Diagnosis not present

## 2019-05-14 DIAGNOSIS — Z882 Allergy status to sulfonamides status: Secondary | ICD-10-CM | POA: Diagnosis not present

## 2019-05-14 DIAGNOSIS — N319 Neuromuscular dysfunction of bladder, unspecified: Secondary | ICD-10-CM | POA: Diagnosis not present

## 2019-05-14 DIAGNOSIS — Z880 Allergy status to penicillin: Secondary | ICD-10-CM | POA: Insufficient documentation

## 2019-05-14 HISTORY — PX: BOTOX INJECTION: SHX5754

## 2019-05-14 HISTORY — PX: VASECTOMY: SHX75

## 2019-05-14 LAB — CBC
HCT: 42.4 % (ref 39.0–52.0)
Hemoglobin: 13.7 g/dL (ref 13.0–17.0)
MCH: 28.7 pg (ref 26.0–34.0)
MCHC: 32.3 g/dL (ref 30.0–36.0)
MCV: 88.7 fL (ref 80.0–100.0)
Platelets: 226 10*3/uL (ref 150–400)
RBC: 4.78 MIL/uL (ref 4.22–5.81)
RDW: 13.2 % (ref 11.5–15.5)
WBC: 5.6 10*3/uL (ref 4.0–10.5)
nRBC: 0 % (ref 0.0–0.2)

## 2019-05-14 LAB — BASIC METABOLIC PANEL
Anion gap: 8 (ref 5–15)
BUN: 10 mg/dL (ref 6–20)
CO2: 25 mmol/L (ref 22–32)
Calcium: 8.7 mg/dL — ABNORMAL LOW (ref 8.9–10.3)
Chloride: 110 mmol/L (ref 98–111)
Creatinine, Ser: 0.6 mg/dL — ABNORMAL LOW (ref 0.61–1.24)
GFR calc Af Amer: >60 mL/min (ref >60)
GFR calc non Af Amer: >60 mL/min (ref >60)
Glucose, Bld: 91 mg/dL (ref 70–99)
Potassium: 3.9 mmol/L (ref 3.5–5.1)
Sodium: 143 mmol/L (ref 135–145)

## 2019-05-14 SURGERY — BOTOX INJECTION
Anesthesia: General | Site: Scrotum

## 2019-05-14 MED ORDER — LACTATED RINGERS IV SOLN
INTRAVENOUS | Status: DC
Start: 2019-05-14 — End: 2019-05-14
  Administered 2019-05-14: 10:00:00 via INTRAVENOUS

## 2019-05-14 MED ORDER — ACETAMINOPHEN 325 MG PO TABS: 325.0000 mg | ORAL_TABLET | ORAL | Status: DC | PRN

## 2019-05-14 MED ORDER — ONDANSETRON HCL 4 MG/2ML IJ SOLN: 4.0000 mg | Freq: Once | INTRAMUSCULAR | Status: DC | PRN

## 2019-05-14 MED ORDER — 0.9 % SODIUM CHLORIDE (POUR BTL) OPTIME
TOPICAL | Status: DC | PRN
  Administered 2019-05-14: 1000 mL

## 2019-05-14 MED ORDER — PROPOFOL 10 MG/ML IV BOLUS
INTRAVENOUS | Status: DC | PRN
  Administered 2019-05-14: 150 mg via INTRAVENOUS

## 2019-05-14 MED ORDER — FENTANYL CITRATE (PF) 100 MCG/2ML IJ SOLN
25.0000 ug | INTRAMUSCULAR | Status: DC | PRN
  Administered 2019-05-14: 50 ug via INTRAVENOUS

## 2019-05-14 MED ORDER — OXYCODONE HCL 5 MG PO TABS
ORAL_TABLET | ORAL | Status: AC
  Filled 2019-05-14: qty 1

## 2019-05-14 MED ORDER — PROPOFOL 10 MG/ML IV BOLUS
INTRAVENOUS | Status: AC
  Filled 2019-05-14: qty 20

## 2019-05-14 MED ORDER — ONDANSETRON HCL 4 MG/2ML IJ SOLN
INTRAMUSCULAR | Status: DC | PRN
  Administered 2019-05-14: 4 mg via INTRAVENOUS

## 2019-05-14 MED ORDER — ONDANSETRON HCL 4 MG/2ML IJ SOLN
INTRAMUSCULAR | Status: AC
  Filled 2019-05-14: qty 2

## 2019-05-14 MED ORDER — FENTANYL CITRATE (PF) 100 MCG/2ML IJ SOLN
INTRAMUSCULAR | Status: DC | PRN
  Administered 2019-05-14: 50 ug via INTRAVENOUS
  Administered 2019-05-14: 25 ug via INTRAVENOUS

## 2019-05-14 MED ORDER — MIDAZOLAM HCL 5 MG/5ML IJ SOLN
INTRAMUSCULAR | Status: DC | PRN
  Administered 2019-05-14 (×2): 1 mg via INTRAVENOUS

## 2019-05-14 MED ORDER — MEPERIDINE HCL 50 MG/ML IJ SOLN: 6.2500 mg | INTRAMUSCULAR | Status: DC | PRN

## 2019-05-14 MED ORDER — ONABOTULINUMTOXINA 100 UNITS IJ SOLR
200.0000 [IU] | Freq: Once | INTRAMUSCULAR | Status: AC
  Administered 2019-05-14: 10:00:00 200 [IU] via INTRAMUSCULAR
  Filled 2019-05-14: qty 200

## 2019-05-14 MED ORDER — KETOROLAC TROMETHAMINE 30 MG/ML IJ SOLN: 30.0000 mg | Freq: Once | INTRAMUSCULAR | Status: DC | PRN

## 2019-05-14 MED ORDER — FENTANYL CITRATE (PF) 100 MCG/2ML IJ SOLN
INTRAMUSCULAR | Status: AC
  Administered 2019-05-14: 50 ug via INTRAVENOUS
  Filled 2019-05-14: qty 2

## 2019-05-14 MED ORDER — FENTANYL CITRATE (PF) 100 MCG/2ML IJ SOLN
INTRAMUSCULAR | Status: AC
  Filled 2019-05-14: qty 2

## 2019-05-14 MED ORDER — HYDROCODONE-ACETAMINOPHEN 5-325 MG PO TABS: 1.0000 | ORAL_TABLET | Freq: Four times a day (QID) | ORAL | 0 refills | Status: AC | PRN

## 2019-05-14 MED ORDER — OXYCODONE HCL 5 MG/5ML PO SOLN: 5.0000 mg | Freq: Once | ORAL | Status: AC | PRN

## 2019-05-14 MED ORDER — SODIUM CHLORIDE 0.9 % IJ SOLN
INTRAMUSCULAR | Status: DC | PRN
  Administered 2019-05-14: 10 mL

## 2019-05-14 MED ORDER — VANCOMYCIN HCL IN DEXTROSE 1-5 GM/200ML-% IV SOLN
1000.0000 mg | Freq: Once | INTRAVENOUS | Status: AC
  Administered 2019-05-14: 1000 mg via INTRAVENOUS
  Filled 2019-05-14: qty 200

## 2019-05-14 MED ORDER — PROPOFOL 10 MG/ML IV BOLUS
INTRAVENOUS | Status: AC
  Filled 2019-05-14: qty 40

## 2019-05-14 MED ORDER — STERILE WATER FOR IRRIGATION IR SOLN
Status: DC | PRN
  Administered 2019-05-14: 1000 mL

## 2019-05-14 MED ORDER — SODIUM CHLORIDE (PF) 0.9 % IJ SOLN
INTRAMUSCULAR | Status: AC
  Filled 2019-05-14: qty 10

## 2019-05-14 MED ORDER — MIDAZOLAM HCL 2 MG/2ML IJ SOLN
INTRAMUSCULAR | Status: AC
  Filled 2019-05-14: qty 2

## 2019-05-14 MED ORDER — OXYCODONE HCL 5 MG PO TABS
5.0000 mg | ORAL_TABLET | Freq: Once | ORAL | Status: AC | PRN
  Administered 2019-05-14: 5 mg via ORAL

## 2019-05-14 MED ORDER — LIDOCAINE HCL (CARDIAC) PF 100 MG/5ML IV SOSY
PREFILLED_SYRINGE | INTRAVENOUS | Status: DC | PRN
  Administered 2019-05-14: 40 mg via INTRAVENOUS

## 2019-05-14 MED ORDER — ACETAMINOPHEN 160 MG/5ML PO SOLN: 325.0000 mg | ORAL | Status: DC | PRN

## 2019-05-14 MED ORDER — DEXAMETHASONE SODIUM PHOSPHATE 10 MG/ML IJ SOLN
INTRAMUSCULAR | Status: DC | PRN
  Administered 2019-05-14: 10 mg via INTRAVENOUS

## 2019-05-14 SURGICAL SUPPLY — 26 items
ADH SKN CLS APL DERMABOND .7 (GAUZE/BANDAGES/DRESSINGS) ×2
BLADE SURG 15 STRL LF DISP TIS (BLADE) ×2 IMPLANT
BLADE SURG 15 STRL SS (BLADE) ×3
CLOTH BEACON ORANGE TIMEOUT ST (SAFETY) ×3 IMPLANT
COVER SURGICAL LIGHT HANDLE (MISCELLANEOUS) ×3 IMPLANT
COVER WAND RF STERILE (DRAPES) IMPLANT
DERMABOND ADVANCED (GAUZE/BANDAGES/DRESSINGS) ×1
DERMABOND ADVANCED .7 DNX12 (GAUZE/BANDAGES/DRESSINGS) ×2 IMPLANT
GAUZE 4X4 16PLY RFD (DISPOSABLE) ×3 IMPLANT
GLOVE BIO SURGEON STRL SZ7.5 (GLOVE) ×3 IMPLANT
GLOVE BIOGEL M STRL SZ7.5 (GLOVE) ×3 IMPLANT
GOWN STRL REUS W/TWL LRG LVL3 (GOWN DISPOSABLE) ×3 IMPLANT
GOWN STRL REUS W/TWL XL LVL3 (GOWN DISPOSABLE) ×3 IMPLANT
KIT TURNOVER KIT A (KITS) IMPLANT
NDL ASPIRATION 22 (NEEDLE) ×2 IMPLANT
NDL SAFETY ECLIPSE 18X1.5 (NEEDLE) ×2 IMPLANT
NEEDLE ASPIRATION 22 (NEEDLE) ×3 IMPLANT
NEEDLE HYPO 18GX1.5 SHARP (NEEDLE) ×3
PACK BASIC VI WITH GOWN DISP (CUSTOM PROCEDURE TRAY) ×3 IMPLANT
PACK CYSTO (CUSTOM PROCEDURE TRAY) ×3 IMPLANT
SUT CHROMIC 3 0 SH 27 (SUTURE) ×1 IMPLANT
SYR 30ML LL (SYRINGE) ×3 IMPLANT
SYR BULB IRRIGATION 50ML (SYRINGE) ×3 IMPLANT
SYR CONTROL 10ML LL (SYRINGE) ×3 IMPLANT
TRAY FOLEY MTR SLVR 14FR STAT (SET/KITS/TRAYS/PACK) ×1 IMPLANT
TUBE CONNECTING VINYL 14FR 30C (TUBING) ×3 IMPLANT

## 2019-05-14 NOTE — Op Note (Addendum)
PATIENT:  Curtis Mann  Preoperative diagnosis:  1. 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 Encounter for sterilization  Postoperative diagnosis:   1. 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 Encounter for sterilization  Procedure:  1. Cystoscopy 2. Intravesical botox injecton, 200U in 14 locations 3. Vasectomy 4. Insertion of foley catheter, simple  Surgeon: Alfredo Martinez, M.D.  Resident: Seward Meth, MD, PhD  Anesthesia: General  Complications: None  EBL: Approximately 2 cc  Specimens: None  Indication: Curtis Mann is a 33 y.o. male with neurogenic detrusor overactivity and refractory urge incontinence and a desire for sterilization. After reviewing the management options for treatment, they have elected to proceed with the above surgical procedure(s). We have discussed the potential benefits and risks of the procedures, side effects of the proposed treatment, the likelihood of the patient achieving the goals of the procedure, and any potential problems that might occur during the procedure or recuperation. Informed consent has been obtained.  Findings:  - Normal urethra. Trigone with ureteral orifices in expected orthotopic locations.  - Mild bladder trabeculation. No evidence of cystitis. - Foley catheter removed prior to procedure and replaced at completion of procedure.  - Injection of 200U of botox in 10 cc of normal saline from 5 to 7 o'clock and cephalad to the interureteric ridge and a little higher in the bladder as well.  - Successful bilateral vasectomy with bilateral vas deferens sent for pathology.   Description of procedure:   The patient was taken to the operating room and general anesthesia was induced.  The patient was placed in the dorsal lithotomy position, prepped and draped in the usual sterile fashion, and preoperative antibiotics were administered. A preoperative time-out was performed.    Cystourethroscopy was performed.  The patient's urethra was examined and was normal. The bladder was then systematically examined in its entirety with findings described above. I injected 200 units of Botox in 10 cc of normal saline from 5 to 7 o'clock and cephalad to the interureteric ridge and a little higher in the bladder as well. Excellent hemostasis was noted. There was no bladder injury and urine was clear.   The bladder was then emptied and we turned our attention to the vasectomy portion of the procedure.   The left vas deferens was identified and isolated under the left scrotal skin. A clamp was used to encircle the vas deferens and a 1 cm incision was made in the scrotal skin overlying the vas deferens. The vas deferens was isolated and transected, removing a piece of vas deferens for pathology. Thermal cautery was applied to the lumen of the vas deferens on both remaining ends until blanching occurred. A 3-0 chromic stick suture was used to closed each end of the vas deferens over on itself, occluding the lumen. 3-0 chromic suture was used to perform fascial interposition. The area was inspected thoroughly and hemostasis obtained with electrocautery prior to reduce the vas deferens into the scrotum. Skin was closed with interrupted 3-0 chromic suture.  The procedure was repeated on the right side. Dermabond was applied. EBL was approximately 2 cc. Fluffs and scrotal support were placed.  14 Fr foley catheter was reinserted with 10 cc sterile water in the balloon.   The patient appeared to tolerate the procedure well and without complications.  The patient was able to be awakened and transferred to the recovery unit in satisfactory condition.   The Botox procedure and the vasectomy went very well.  The surgery was performed by myself with the assistant noted

## 2019-05-14 NOTE — Anesthesia Procedure Notes (Signed)
Procedure Name: LMA Insertion Date/Time: 05/14/2019 9:57 AM Performed by: Thornell Mule, CRNA Pre-anesthesia Checklist: Patient identified, Emergency Drugs available, Suction available and Patient being monitored Patient Re-evaluated:Patient Re-evaluated prior to induction Oxygen Delivery Method: Circle system utilized Preoxygenation: Pre-oxygenation with 100% oxygen Induction Type: IV induction LMA: LMA inserted LMA Size: 4.0 Number of attempts: 1 Placement Confirmation: positive ETCO2 Tube secured with: Tape Dental Injury: Teeth and Oropharynx as per pre-operative assessment

## 2019-05-14 NOTE — Transfer of Care (Signed)
Immediate Anesthesia Transfer of Care Note  Patient: Curtis Mann  Procedure(s) Performed: CYSTOSCOPY BOTOX INJECTION (N/A Bladder) VASECTOMY (Bilateral Scrotum)  Patient Location: PACU  Anesthesia Type:General  Level of Consciousness: awake, alert  and oriented  Airway & Oxygen Therapy: Patient Spontanous Breathing and Patient connected to face mask oxygen  Post-op Assessment: Report given to RN and Post -op Vital signs reviewed and stable  Post vital signs: Reviewed and stable  Last Vitals:  Vitals Value Taken Time  BP 107/51 05/14/2019 11:21 AM  Temp    Pulse 61 05/14/2019 11:23 AM  Resp 13 05/14/2019 11:23 AM  SpO2 100 % 05/14/2019 11:23 AM  Vitals shown include unvalidated device data.  Last Pain:  Vitals:   05/14/19 0935  TempSrc:   PainSc: 3       Patients Stated Pain Goal: 4 (05/14/19 0935)  Complications: No apparent anesthesia complications

## 2019-05-14 NOTE — Progress Notes (Signed)
Dr Sherron Monday in PACU to see patient prior to discharge

## 2019-05-14 NOTE — Discharge Instructions (Signed)
I have reviewed discharge instructions in detail with the patient. They will follow-up with me or their physician as scheduled. My nurse will also be calling the patients as per protocol.    Cystoscopy  Cystoscopy is a procedure that is used to help diagnose and sometimes treat conditions that affect that lower urinary tract. The lower urinary tract includes the bladder and the tube that drains urine from the bladder out of the body (urethra). Cystoscopy is performed with a thin, tube-shaped instrument with a light and camera at the end (cystoscope). The cystoscope may be hard (rigid) or flexible, depending on the goal of the procedure.The cystoscope is inserted through the urethra, into the bladder. Cystoscopy may be recommended if you have:  Urinary tractinfections that keep coming back (recurring).  Blood in the urine (hematuria).  Loss of bladder control (urinary incontinence) or an overactive bladder.  Unusual cells found in a urine sample.  A blockage in the urethra.  Painful urination.  An abnormality in the bladder found during an intravenous pyelogram (IVP) or CT scan. Cystoscopy may also be done to remove a sample of tissue to be examined under a microscope (biopsy). Tell a health care provider about:  Any allergies you have.  All medicines you are taking, including vitamins, herbs, eye drops, creams, and over-the-counter medicines.  Any problems you or family members have had with anesthetic medicines.  Any blood disorders you have.  Any surgeries you have had.  Any medical conditions you have.  Whether you are pregnant or may be pregnant. What are the risks? Generally, this is a safe procedure. However, problems may occur, including:  Infection.  Bleeding.  Allergic reactions to medicines.  Damage to other structures or organs. What happens before the procedure?  Ask your health care provider about: ? Changing or stopping your regular medicines. This is  especially important if you are taking diabetes medicines or blood thinners. ? Taking medicines such as aspirin and ibuprofen. These medicines can thin your blood. Do not take these medicines before your procedure if your health care provider instructs you not to.  Follow instructions from your health care provider about eating or drinking restrictions.  You may be given antibiotic medicine to help prevent infection.  You may have an exam or testing, such as X-rays of the bladder, urethra, or kidneys.  You may have urine tests to check for signs of infection.  Plan to have someone take you home after the procedure. What happens during the procedure?  To reduce your risk of infection,your health care team will wash or sanitize their hands.  You will be given one or more of the following: ? A medicine to help you relax (sedative). ? A medicine to numb the area (local anesthetic).  The area around the opening of your urethra will be cleaned.  The cystoscope will be passed through your urethra into your bladder.  Germ-free (sterile)fluid will flow through the cystoscope to fill your bladder. The fluid will stretch your bladder so that your surgeon can clearly examine your bladder walls.  The cystoscope will be removed and your bladder will be emptied. The procedure may vary among health care providers and hospitals. What happens after the procedure?  You may have some soreness or pain in your abdomen and urethra. Medicines will be available to help you.  You may have some blood in your urine.  Do not drive for 24 hours if you received a sedative. This information is not intended to replace  advice given to you by your health care provider. Make sure you discuss any questions you have with your health care provider. Document Released: 11/25/2000 Document Revised: 09/08/2017 Document Reviewed: 10/15/2015 Elsevier Interactive Patient Education  2019 [employer] Inc[employer]    General  Anesthesia, Adult, Care After This sheet gives you information about how to care for yourself after your procedure. Your health care provider may also give you more specific instructions. If you have problems or questions, contact your health care provider. What can I expect after the procedure? After the procedure, the following side effects are common:  Pain or discomfort at the IV site.  Nausea.  Vomiting.  Sore throat.  Trouble concentrating.  Feeling cold or chills.  Weak or tired.  Sleepiness and fatigue.  Soreness and body aches. These side effects can affect parts of the body that were not involved in surgery. Follow these instructions at home:  For at least 24 hours after the procedure:  Have a responsible adult stay with you. It is important to have someone help care for you until you are awake and alert.  Rest as needed.  Do not: ? Participate in activities in which you could fall or become injured. ? Drive. ? Use heavy machinery. ? Drink alcohol. ? Take sleeping pills or medicines that cause drowsiness. ? Make important decisions or sign legal documents. ? Take care of children on your own. Eating and drinking  Follow any instructions from your health care provider about eating or drinking restrictions.  When you feel hungry, start by eating small amounts of foods that are soft and easy to digest (bland), such as toast. Gradually return to your regular diet.  Drink enough fluid to keep your urine pale yellow.  If you vomit, rehydrate by drinking water, juice, or clear broth. General instructions  If you have sleep apnea, surgery and certain medicines can increase your risk for breathing problems. Follow instructions from your health care provider about wearing your sleep device: ? Anytime you are sleeping, including during daytime naps. ? While taking prescription pain medicines, sleeping medicines, or medicines that make you drowsy.  Return to your  normal activities as told by your health care provider. Ask your health care provider what activities are safe for you.  Take over-the-counter and prescription medicines only as told by your health care provider.  If you smoke, do not smoke without supervision.  Keep all follow-up visits as told by your health care provider. This is important. Contact a health care provider if:  You have nausea or vomiting that does not get better with medicine.  You cannot eat or drink without vomiting.  You have pain that does not get better with medicine.  You are unable to pass urine.  You develop a skin rash.  You have a fever.  You have redness around your IV site that gets worse. Get help right away if:  You have difficulty breathing.  You have chest pain.  You have blood in your urine or stool, or you vomit blood. Summary  After the procedure, it is common to have a sore throat or nausea. It is also common to feel tired.  Have a responsible adult stay with you for the first 24 hours after general anesthesia. It is important to have someone help care for you until you are awake and alert.  When you feel hungry, start by eating small amounts of foods that are soft and easy to digest (bland), such as toast. Gradually  return to your regular diet.  Drink enough fluid to keep your urine pale yellow.  Return to your normal activities as told by your health care provider. Ask your health care provider what activities are safe for you. This information is not intended to replace advice given to you by your health care provider. Make sure you discuss any questions you have with your health care provider. Document Released: 03/06/2001 Document Revised: 07/14/2017 Document Reviewed: 07/14/2017 Elsevier Interactive Patient Education  2019 [employer].

## 2019-05-14 NOTE — Interval H&P Note (Signed)
History and Physical Interval Note:  05/14/2019 9:00 AM  Curtis Mann  has presented today for surgery, with the diagnosis of NEUROGENIC DETRUSOR OVERACTIVITY, REFRACTORY URGENCY INCONTINENCE.  The various methods of treatment have been discussed with the patient and family. After consideration of risks, benefits and other options for treatment, the patient has consented to  Procedure(s): CYSTOSCOPY BOTOX INJECTION (N/A) VASECTOMY (Bilateral) as a surgical intervention.  The patient's history has been reviewed, patient examined, no change in status, stable for surgery.  I have reviewed the patient's chart and labs.  Questions were answered to the patient's satisfaction.     Annelle Behrendt A Fabiano Ginley

## 2019-05-14 NOTE — Anesthesia Preprocedure Evaluation (Signed)
Anesthesia Evaluation  Patient identified by MRN, date of birth, ID band Patient awake    Reviewed: Allergy & Precautions, NPO status , Patient's Chart, lab work & pertinent test results  Airway Mallampati: II  TM Distance: >3 FB Neck ROM: Limited    Dental  (+) Teeth Intact, Dental Advisory Given   Pulmonary former smoker,    breath sounds clear to auscultation       Cardiovascular negative cardio ROS Normal cardiovascular exam Rhythm:Regular Rate:Normal     Neuro/Psych negative psych ROS   GI/Hepatic negative GI ROS, Neg liver ROS,   Endo/Other  negative endocrine ROS  Renal/GU negative Renal ROS     Musculoskeletal   Abdominal Normal abdominal exam  (+)   Peds  Hematology negative hematology ROS (+)   Anesthesia Other Findings   Reproductive/Obstetrics                             Anesthesia Physical  Anesthesia Plan  ASA: III  Anesthesia Plan: General   Post-op Pain Management:    Induction: Intravenous  PONV Risk Score and Plan: Ondansetron and Dexamethasone  Airway Management Planned: LMA  Additional Equipment:   Intra-op Plan:   Post-operative Plan: Extubation in OR  Informed Consent: I have reviewed the patients History and Physical, chart, labs and discussed the procedure including the risks, benefits and alternatives for the proposed anesthesia with the patient or authorized representative who has indicated his/her understanding and acceptance.     Dental advisory given  Plan Discussed with: CRNA  Anesthesia Plan Comments:         Anesthesia Quick Evaluation

## 2019-05-15 ENCOUNTER — Encounter (HOSPITAL_COMMUNITY): Payer: Self-pay | Admitting: Urology

## 2019-05-15 NOTE — Anesthesia Postprocedure Evaluation (Signed)
Anesthesia Post Note  Patient: Curtis Mann  Procedure(s) Performed: CYSTOSCOPY BOTOX INJECTION (N/A Bladder) VASECTOMY (Bilateral Scrotum)     Patient location during evaluation: PACU Anesthesia Type: General Level of consciousness: awake Pain management: pain level controlled Vital Signs Assessment: post-procedure vital signs reviewed and stable Respiratory status: spontaneous breathing Cardiovascular status: stable Postop Assessment: no apparent nausea or vomiting Anesthetic complications: no    Last Vitals:  Vitals:   05/14/19 1212 05/14/19 1300  BP: 113/66 108/64  Pulse: 63 65  Resp: 13 14  Temp: 36.5 C 36.4 C  SpO2: 97% 97%    Last Pain:  Vitals:   05/14/19 1300  TempSrc: Oral  PainSc: 2    Pain Goal: Patients Stated Pain Goal: 4 (05/14/19 1212)                 Caren Macadam

## 2019-08-16 DIAGNOSIS — R109 Unspecified abdominal pain: Secondary | ICD-10-CM | POA: Diagnosis not present

## 2019-08-16 DIAGNOSIS — R8279 Other abnormal findings on microbiological examination of urine: Secondary | ICD-10-CM | POA: Diagnosis not present

## 2019-09-19 DIAGNOSIS — Z20828 Contact with and (suspected) exposure to other viral communicable diseases: Secondary | ICD-10-CM | POA: Diagnosis not present

## 2019-10-03 DIAGNOSIS — Z20828 Contact with and (suspected) exposure to other viral communicable diseases: Secondary | ICD-10-CM | POA: Diagnosis not present

## 2019-10-17 DIAGNOSIS — N3020 Other chronic cystitis without hematuria: Secondary | ICD-10-CM | POA: Diagnosis not present

## 2019-10-17 DIAGNOSIS — N312 Flaccid neuropathic bladder, not elsewhere classified: Secondary | ICD-10-CM | POA: Diagnosis not present

## 2019-10-24 DIAGNOSIS — Z23 Encounter for immunization: Secondary | ICD-10-CM | POA: Diagnosis not present

## 2019-10-24 DIAGNOSIS — G894 Chronic pain syndrome: Secondary | ICD-10-CM | POA: Diagnosis not present

## 2019-10-24 DIAGNOSIS — W130XXS Fall from, out of or through balcony, sequela: Secondary | ICD-10-CM | POA: Diagnosis not present

## 2019-10-24 DIAGNOSIS — Z993 Dependence on wheelchair: Secondary | ICD-10-CM | POA: Diagnosis not present

## 2019-10-24 DIAGNOSIS — N319 Neuromuscular dysfunction of bladder, unspecified: Secondary | ICD-10-CM | POA: Diagnosis not present

## 2019-11-15 DIAGNOSIS — Z20828 Contact with and (suspected) exposure to other viral communicable diseases: Secondary | ICD-10-CM | POA: Diagnosis not present

## 2019-11-25 ENCOUNTER — Encounter (HOSPITAL_COMMUNITY): Payer: Self-pay | Admitting: Emergency Medicine

## 2019-11-25 ENCOUNTER — Emergency Department (HOSPITAL_COMMUNITY)
Admission: EM | Admit: 2019-11-25 | Discharge: 2019-11-25 | Disposition: A | Discharge status: Home / Self Care | Payer: Medicare Other | Source: Home / Self Care

## 2019-11-25 ENCOUNTER — Other Ambulatory Visit: Payer: Self-pay

## 2019-11-25 DIAGNOSIS — T83518A Infection and inflammatory reaction due to other urinary catheter, initial encounter: Secondary | ICD-10-CM | POA: Diagnosis not present

## 2019-11-25 DIAGNOSIS — K592 Neurogenic bowel, not elsewhere classified: Secondary | ICD-10-CM | POA: Diagnosis not present

## 2019-11-25 DIAGNOSIS — Z20828 Contact with and (suspected) exposure to other viral communicable diseases: Secondary | ICD-10-CM | POA: Diagnosis not present

## 2019-11-25 DIAGNOSIS — E441 Mild protein-calorie malnutrition: Secondary | ICD-10-CM | POA: Diagnosis not present

## 2019-11-25 DIAGNOSIS — R0789 Other chest pain: Secondary | ICD-10-CM | POA: Insufficient documentation

## 2019-11-25 DIAGNOSIS — F419 Anxiety disorder, unspecified: Secondary | ICD-10-CM | POA: Insufficient documentation

## 2019-11-25 DIAGNOSIS — Z5321 Procedure and treatment not carried out due to patient leaving prior to being seen by health care provider: Secondary | ICD-10-CM | POA: Insufficient documentation

## 2019-11-25 DIAGNOSIS — G8250 Quadriplegia, unspecified: Secondary | ICD-10-CM | POA: Diagnosis not present

## 2019-11-25 DIAGNOSIS — N10 Acute tubulo-interstitial nephritis: Secondary | ICD-10-CM | POA: Diagnosis not present

## 2019-11-25 NOTE — ED Notes (Signed)
No answer for vitals x 3  

## 2019-11-25 NOTE — ED Notes (Signed)
No answer x 4  

## 2019-11-25 NOTE — ED Notes (Signed)
No  Answer x 3 

## 2019-11-25 NOTE — ED Notes (Signed)
Chief complaints documented on wrong pt.

## 2019-11-25 NOTE — ED Notes (Signed)
No answer for room x3 

## 2019-11-27 ENCOUNTER — Emergency Department (HOSPITAL_COMMUNITY): Payer: Medicare Other

## 2019-11-27 ENCOUNTER — Other Ambulatory Visit: Payer: Self-pay

## 2019-11-27 ENCOUNTER — Inpatient Hospital Stay (HOSPITAL_COMMUNITY)
Admission: EM | Admit: 2019-11-27 | Discharge: 2019-11-29 | DRG: 698 | Disposition: A | Discharge status: Home / Self Care | Payer: Medicare Other | Attending: Internal Medicine | Admitting: Internal Medicine

## 2019-11-27 ENCOUNTER — Observation Stay (HOSPITAL_COMMUNITY): Payer: Medicare Other

## 2019-11-27 ENCOUNTER — Encounter (HOSPITAL_COMMUNITY): Payer: Self-pay | Admitting: Emergency Medicine

## 2019-11-27 DIAGNOSIS — Z8701 Personal history of pneumonia (recurrent): Secondary | ICD-10-CM

## 2019-11-27 DIAGNOSIS — Z880 Allergy status to penicillin: Secondary | ICD-10-CM

## 2019-11-27 DIAGNOSIS — N319 Neuromuscular dysfunction of bladder, unspecified: Secondary | ICD-10-CM

## 2019-11-27 DIAGNOSIS — N3000 Acute cystitis without hematuria: Secondary | ICD-10-CM

## 2019-11-27 DIAGNOSIS — K592 Neurogenic bowel, not elsewhere classified: Secondary | ICD-10-CM | POA: Diagnosis present

## 2019-11-27 DIAGNOSIS — I959 Hypotension, unspecified: Secondary | ICD-10-CM

## 2019-11-27 DIAGNOSIS — R109 Unspecified abdominal pain: Secondary | ICD-10-CM

## 2019-11-27 DIAGNOSIS — Y846 Urinary catheterization as the cause of abnormal reaction of the patient, or of later complication, without mention of misadventure at the time of the procedure: Secondary | ICD-10-CM | POA: Diagnosis present

## 2019-11-27 DIAGNOSIS — Z681 Body mass index (BMI) 19 or less, adult: Secondary | ICD-10-CM

## 2019-11-27 DIAGNOSIS — Z20828 Contact with and (suspected) exposure to other viral communicable diseases: Secondary | ICD-10-CM | POA: Diagnosis present

## 2019-11-27 DIAGNOSIS — G8250 Quadriplegia, unspecified: Secondary | ICD-10-CM | POA: Diagnosis present

## 2019-11-27 DIAGNOSIS — N390 Urinary tract infection, site not specified: Secondary | ICD-10-CM | POA: Diagnosis not present

## 2019-11-27 DIAGNOSIS — R369 Urethral discharge, unspecified: Secondary | ICD-10-CM | POA: Diagnosis present

## 2019-11-27 DIAGNOSIS — Z79899 Other long term (current) drug therapy: Secondary | ICD-10-CM

## 2019-11-27 DIAGNOSIS — Z87891 Personal history of nicotine dependence: Secondary | ICD-10-CM

## 2019-11-27 DIAGNOSIS — Z881 Allergy status to other antibiotic agents status: Secondary | ICD-10-CM

## 2019-11-27 DIAGNOSIS — T83511A Infection and inflammatory reaction due to indwelling urethral catheter, initial encounter: Secondary | ICD-10-CM | POA: Diagnosis not present

## 2019-11-27 DIAGNOSIS — R0789 Other chest pain: Secondary | ICD-10-CM | POA: Diagnosis present

## 2019-11-27 DIAGNOSIS — K5900 Constipation, unspecified: Secondary | ICD-10-CM

## 2019-11-27 DIAGNOSIS — R079 Chest pain, unspecified: Secondary | ICD-10-CM

## 2019-11-27 DIAGNOSIS — Z993 Dependence on wheelchair: Secondary | ICD-10-CM

## 2019-11-27 DIAGNOSIS — N10 Acute tubulo-interstitial nephritis: Secondary | ICD-10-CM | POA: Diagnosis present

## 2019-11-27 DIAGNOSIS — E441 Mild protein-calorie malnutrition: Secondary | ICD-10-CM | POA: Diagnosis present

## 2019-11-27 DIAGNOSIS — Z800 Family history of malignant neoplasm of digestive organs: Secondary | ICD-10-CM

## 2019-11-27 DIAGNOSIS — Z8249 Family history of ischemic heart disease and other diseases of the circulatory system: Secondary | ICD-10-CM

## 2019-11-27 DIAGNOSIS — Z882 Allergy status to sulfonamides status: Secondary | ICD-10-CM

## 2019-11-27 DIAGNOSIS — T83518A Infection and inflammatory reaction due to other urinary catheter, initial encounter: Principal | ICD-10-CM | POA: Diagnosis present

## 2019-11-27 LAB — LIPASE, BLOOD: Lipase: 27 U/L (ref 11–51)

## 2019-11-27 LAB — CBC WITH DIFFERENTIAL/PLATELET
Abs Immature Granulocytes: 0.02 10*3/uL (ref 0.00–0.07)
Basophils Absolute: 0 10*3/uL (ref 0.0–0.1)
Basophils Relative: 0 %
Eosinophils Absolute: 0.1 10*3/uL (ref 0.0–0.5)
Eosinophils Relative: 1 %
HCT: 43.9 % (ref 39.0–52.0)
Hemoglobin: 14.5 g/dL (ref 13.0–17.0)
Immature Granulocytes: 0 %
Lymphocytes Relative: 27 %
Lymphs Abs: 2.2 10*3/uL (ref 0.7–4.0)
MCH: 29.1 pg (ref 26.0–34.0)
MCHC: 33 g/dL (ref 30.0–36.0)
MCV: 88 fL (ref 80.0–100.0)
Monocytes Absolute: 0.9 10*3/uL (ref 0.1–1.0)
Monocytes Relative: 11 %
Neutro Abs: 5.2 10*3/uL (ref 1.7–7.7)
Neutrophils Relative %: 61 %
Platelets: 328 10*3/uL (ref 150–400)
RBC: 4.99 MIL/uL (ref 4.22–5.81)
RDW: 12.9 % (ref 11.5–15.5)
WBC: 8.4 10*3/uL (ref 4.0–10.5)
nRBC: 0 % (ref 0.0–0.2)

## 2019-11-27 LAB — LACTIC ACID, PLASMA
Lactic Acid, Venous: 1 mmol/L (ref 0.5–1.9)
Lactic Acid, Venous: 0.8 mmol/L (ref 0.5–1.9)

## 2019-11-27 LAB — COMPREHENSIVE METABOLIC PANEL
ALT: 15 U/L (ref 0–44)
AST: 16 U/L (ref 15–41)
Albumin: 4.5 g/dL (ref 3.5–5.0)
Alkaline Phosphatase: 45 U/L (ref 38–126)
Anion gap: 11 (ref 5–15)
BUN: 13 mg/dL (ref 6–20)
CO2: 26 mmol/L (ref 22–32)
Calcium: 9.5 mg/dL (ref 8.9–10.3)
Chloride: 104 mmol/L (ref 98–111)
Creatinine, Ser: 0.51 mg/dL — ABNORMAL LOW (ref 0.61–1.24)
GFR calc Af Amer: >60 mL/min (ref >60)
GFR calc non Af Amer: >60 mL/min (ref >60)
Glucose, Bld: 91 mg/dL (ref 70–99)
Potassium: 3.9 mmol/L (ref 3.5–5.1)
Sodium: 141 mmol/L (ref 135–145)
Total Bilirubin: 0.7 mg/dL (ref 0.3–1.2)
Total Protein: 8.1 g/dL (ref 6.5–8.1)

## 2019-11-27 LAB — URINALYSIS, ROUTINE W REFLEX MICROSCOPIC
Bilirubin Urine: NEGATIVE
Glucose, UA: NEGATIVE mg/dL
Ketones, ur: 80 mg/dL — AB
Nitrite: NEGATIVE
Protein, ur: NEGATIVE mg/dL
Specific Gravity, Urine: 1.017 (ref 1.005–1.030)
WBC, UA: >50 WBC/hpf — ABNORMAL HIGH (ref 0–5)
pH: 6 (ref 5.0–8.0)

## 2019-11-27 LAB — TROPONIN I (HIGH SENSITIVITY)
Troponin I (High Sensitivity): 8 ng/L (ref <18)
Troponin I (High Sensitivity): 8 ng/L (ref <18)

## 2019-11-27 LAB — D-DIMER, QUANTITATIVE: D-Dimer, Quant: 0.3 ug/mL-FEU (ref 0.00–0.50)

## 2019-11-27 LAB — POC SARS CORONAVIRUS 2 AG -  ED: SARS Coronavirus 2 Ag: NEGATIVE

## 2019-11-27 LAB — BRAIN NATRIURETIC PEPTIDE: B Natriuretic Peptide: 16.2 pg/mL (ref 0.0–100.0)

## 2019-11-27 MED ORDER — SODIUM CHLORIDE 0.9 % IV SOLN: 1.0000 g | INTRAVENOUS | Status: DC

## 2019-11-27 MED ORDER — DOCUSATE SODIUM 100 MG PO CAPS
100.0000 mg | ORAL_CAPSULE | Freq: Two times a day (BID) | ORAL | Status: DC
  Administered 2019-11-27 – 2019-11-29 (×3): 100 mg via ORAL
  Filled 2019-11-27 (×4): qty 1

## 2019-11-27 MED ORDER — OXYBUTYNIN CHLORIDE ER 5 MG PO TB24
15.0000 mg | ORAL_TABLET | Freq: Every day | ORAL | Status: DC | PRN
  Filled 2019-11-27: qty 1

## 2019-11-27 MED ORDER — SODIUM CHLORIDE 0.9 % IV SOLN
1.0000 g | Freq: Once | INTRAVENOUS | Status: AC
  Administered 2019-11-27: 1 g via INTRAVENOUS
  Filled 2019-11-27: qty 10

## 2019-11-27 MED ORDER — HYDROCODONE-ACETAMINOPHEN 5-325 MG PO TABS
1.0000 | ORAL_TABLET | Freq: Four times a day (QID) | ORAL | Status: DC | PRN
  Administered 2019-11-27 – 2019-11-29 (×6): 1 via ORAL
  Filled 2019-11-27 (×2): qty 1
  Filled 2019-11-27: qty 2
  Filled 2019-11-27 (×3): qty 1

## 2019-11-27 MED ORDER — ENOXAPARIN SODIUM 40 MG/0.4ML ~~LOC~~ SOLN
40.0000 mg | SUBCUTANEOUS | Status: DC
  Administered 2019-11-27 – 2019-11-28 (×2): 40 mg via SUBCUTANEOUS
  Filled 2019-11-27 (×2): qty 0.4

## 2019-11-27 MED ORDER — ONDANSETRON HCL 4 MG/2ML IJ SOLN: 4.0000 mg | Freq: Four times a day (QID) | INTRAMUSCULAR | Status: DC | PRN

## 2019-11-27 MED ORDER — CIPROFLOXACIN HCL 500 MG PO TABS: 500.0000 mg | ORAL_TABLET | Freq: Two times a day (BID) | ORAL | 0 refills | Status: DC

## 2019-11-27 MED ORDER — CIPROFLOXACIN HCL 500 MG PO TABS
500.0000 mg | ORAL_TABLET | Freq: Once | ORAL | Status: AC
  Administered 2019-11-27: 500 mg via ORAL
  Filled 2019-11-27: qty 1

## 2019-11-27 MED ORDER — ZOLPIDEM TARTRATE 5 MG PO TABS
5.0000 mg | ORAL_TABLET | Freq: Once | ORAL | Status: AC
  Administered 2019-11-27: 5 mg via ORAL
  Filled 2019-11-27: qty 1

## 2019-11-27 MED ORDER — SENNOSIDES-DOCUSATE SODIUM 8.6-50 MG PO TABS
1.0000 | ORAL_TABLET | Freq: Every evening | ORAL | Status: DC | PRN
  Administered 2019-11-28: 1 via ORAL
  Filled 2019-11-27: qty 1

## 2019-11-27 MED ORDER — ACETAMINOPHEN 325 MG PO TABS
650.0000 mg | ORAL_TABLET | Freq: Four times a day (QID) | ORAL | Status: DC | PRN
  Filled 2019-11-27: qty 2

## 2019-11-27 MED ORDER — ACETAMINOPHEN 650 MG RE SUPP: 650.0000 mg | Freq: Four times a day (QID) | RECTAL | Status: DC | PRN

## 2019-11-27 MED ORDER — BACLOFEN 20 MG PO TABS
20.0000 mg | ORAL_TABLET | Freq: Three times a day (TID) | ORAL | Status: DC
  Administered 2019-11-27 – 2019-11-29 (×5): 20 mg via ORAL
  Filled 2019-11-27 (×5): qty 1

## 2019-11-27 MED ORDER — ADULT MULTIVITAMIN W/MINERALS CH
1.0000 | ORAL_TABLET | Freq: Every day | ORAL | Status: DC
  Administered 2019-11-27 – 2019-11-29 (×3): 1 via ORAL
  Filled 2019-11-27 (×3): qty 1

## 2019-11-27 MED ORDER — ONDANSETRON HCL 4 MG PO TABS
4.0000 mg | ORAL_TABLET | Freq: Four times a day (QID) | ORAL | Status: DC | PRN
  Administered 2019-11-29: 4 mg via ORAL
  Filled 2019-11-27: qty 1

## 2019-11-27 MED ORDER — SODIUM CHLORIDE 0.9 % IV SOLN: INTRAVENOUS | Status: AC

## 2019-11-27 NOTE — ED Notes (Signed)
Patel MD at bedside.

## 2019-11-27 NOTE — ED Notes (Signed)
Pt reports abdominal pain. RN notified.

## 2019-11-27 NOTE — H&P (Signed)
History and Physical    Curtis Mann WHQ:[id number] DOB: 06-14-1986 DOA: 11/27/2019  PCP: Florentina Jenny, MD  Patient coming from: Home  I have personally briefly reviewed patient's old medical records in [facility]  Chief Complaint: Bladder pain  HPI: Curtis Mann is a 33 y.o. male with medical history significant for C5 quadriplegia with neurogenic bowel and bladder who presents to the ED for evaluation of concern for UTI and left chest wall discomfort. Patient alternates in and out catheterization for 2 weeks with Foley catheter placement every 2 weeks.  He says he last placed a Foley catheter 11/22/2019.  He is also on daily nitrofurantoin for UTI prophylaxis.   Patient reports experiencing bladder discomfort approximately 3 days ago.  He noticed seeing purulent penile discharge.  He has been experiencing chills and diaphoresis.  He says these are the usual symptoms he gets when he has a UTI.  He tried doubling of his nitrofurantoin for his urologist recommendation without significant relief.  He also notes having intermittent left-sided chest pain without radiation and occasional shortness of breath.  He has checked his oxygen at home with lowest reading of 96%.  He is not taking a bowel regimen and says his last bowel movement was on 11/22/2019 with a substantial amount of stool.  Of note, he says he has a newborn son at home who was born 2 days ago.  Given his exposure in the healthcare system he came to the ED for evaluation of UTI as well as for testing for COVID-19.  He was last admitted in June 2019 for UTI at which time urine cultures grew Proteus vulgaris which was resistant to ampicillin, cefazolin, and nitrofurantoin, otherwise sensitive.  He was treated with and tolerated IV ceftriaxone at that time.  ED Course:  Initial vitals showed BP 90/43, pulse 90, RR 16, temp 98.1 Fahrenheit, SPO2 96% on room air.  Labs show WBC 8.4, hemoglobin 14.5, platelets 328,000, BUN 13,  creatinine 0.51, sodium 141, potassium 3.9, lipase 27, lactic acid 1.0, D-dimer 0.3, high-sensitivity troponin I 8, BNP 16.2  Urinalysis showed negative nitrites, large leukocytes, 11-20 RBCs, > 50 WBCs, many bacteria on microscopy.  Urine culture was obtained and pending.  POC SARS-CoV-2 antigen test is negative.  Novel coronavirus, NAA test is ordered and pending.  Portable chest x-ray showed clear lung fields without focal consolidation, edema, or effusion.  Postoperative cervical changes are noted.  Patient was given oral ciprofloxacin however had reported intolerance.  EDP discussed with pharmacy given previously reported allergies who recommended treatment with IV ceftriaxone.  The hospitalist service was consulted admit for further evaluation management.  Review of Systems: All systems reviewed and are negative except as documented in history of present illness above.   Past Medical History:  Diagnosis Date  . Complication of anesthesia    in 2015 patient states he woke up immediately after surgery , 2016- took him 30 minutes to wake up   . Hypotension occasional  . Neurogenic bladder    foley and i and o caths  . Neurogenic bladder    i and o caths 6-8 times day  . Neurogenic bowel    bowel program with digital stimulation as needed  . Neuromuscular disorder (HCC)    Quadriplegic uses electric wheelchair, Nurse, adult or Two person Transfer  . Quadriplegic spinal paralysis (HCC) 2009    Past Surgical History:  Procedure Laterality Date  . bicep and tricept tendon transfers bilateral  2011  .  bladder botox surgery  2012  . BOTOX INJECTION N/A 03/06/2018   Procedure: BOTOX INJECTION CYSTOSCOPY WITH FULGURATION;  Surgeon: Alfredo MartinezMacDiarmid, Scott, MD;  Location: [facility];  Service: Urology;  Laterality: N/A;  . BOTOX INJECTION N/A 05/14/2019   Procedure: CYSTOSCOPY BOTOX INJECTION;  Surgeon: Alfredo MartinezMacDiarmid, Scott, MD;  Location: [facility];  Service: Urology;  Laterality: N/A;  . CYSTOSCOPY N/A  01/20/2014   Procedure: CYSTOSCOPY WITH BOTOX INJECTION;  Surgeon: Kathi LudwigSigmund I Tannenbaum, MD;  Location: [facility];  Service: Urology;  Laterality: N/A;  . CYSTOSCOPY N/A 04/09/2015   Procedure: CYSTOSCOPY WITH BOTOX INJECTION;  Surgeon: Jethro BolusSigmund Tannenbaum, MD;  Location: [facility];  Service: Urology;  Laterality: N/A;  . CYSTOSCOPY WITH INJECTION  10/11/2012   Procedure: CYSTOSCOPY WITH INJECTION;  Surgeon: Kathi LudwigSigmund I Tannenbaum, MD;  Location: [facility];  Service: Urology;  Laterality: N/A;  botox  . CYSTOSCOPY WITH INJECTION N/A 04/29/2016   Procedure: CYSTOSCOPY WITH INJECTION;  Surgeon: Jethro BolusSigmund Tannenbaum, MD;  Location: [facility];  Service: Urology;  Laterality: N/A;  . 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 VASECTOMY Bilateral 05/14/2019   Procedure: VASECTOMY;  Surgeon: Alfredo MartinezMacDiarmid, Scott, MD;  Location: [facility];  Service: Urology;  Laterality: Bilateral;    Social History:  reports that he quit smoking about 11 years ago. His smoking use included cigarettes. He has a 1.00 pack-year smoking history. He has never used smokeless tobacco. He reports that he does not drink alcohol or use drugs.  Allergies  Allergen Reactions  . Amoxicillin Hives  . Bactrim [Sulfamethoxazole-Trimethoprim] Hives  . Penicillins Hives    Did it involve swelling of the face/tongue/throat, SOB, or low BP? No Did it involve sudden or severe rash/hives, skin peeling, or any reaction on the inside of your mouth or nose? No Did you need to seek medical attention at a hospital or doctor's office? No When did it last happen?1 year If all above answers are "NO", may proceed with cephalosporin use.     Family History  Problem Relation Age of Onset  . Colon cancer Mother   . Pulmonary embolism Mother   . Hypertension Father   . Congestive Heart Failure Paternal Grandfather      Prior to Admission medications   Medication Sig Start Date End Date Taking? Authorizing Provider  baclofen (LIORESAL) 20 MG tablet  Take 20 mg by mouth 3 (three) times daily.   Yes [provider]  HYDROcodone-acetaminophen (NORCO) 5-325 MG tablet Take 1-2 tablets by mouth every 6 (six) hours as needed for moderate pain. 05/14/19  Yes MacDiarmid, Lorin PicketScott, MD  loratadine-pseudoephedrine (CLARITIN-D 24-HOUR) 10-240 MG 24 hr tablet Take 1 tablet by mouth daily as needed for allergies.   Yes [provider]  Multiple Vitamin (MULTIVITAMIN WITH MINERALS) TABS tablet Take 1 tablet by mouth daily.   Yes [provider]  nitrofurantoin (MACRODANTIN) 100 MG capsule Take 100 mg by mouth daily. 11/05/19  Yes [provider]  oxybutynin (DITROPAN XL) 15 MG 24 hr tablet Take 15 mg by mouth daily as needed (self catheter).    Yes [provider]    Physical Exam: Vitals:   11/27/19 1700 11/27/19 1800 11/27/19 1900 11/27/19 1901  BP: (!) 108/39 116/76 (!) 127/54   Pulse: 94 86 80   Resp: 11 17 15    Temp:      TempSrc:      SpO2: 99% 98% 99%   Weight:    63.5 kg  Height:  6' (1.829 m)    Constitutional: Resting in bed with head elevated, in no acute distress, calm Eyes: PERRL, lids and conjunctivae normal ENMT: Mucous membranes are moist. Posterior pharynx clear of any exudate or lesions. Neck: normal, supple, no masses. Respiratory: clear to auscultation bilaterally, no wheezing, no crackles. Normal respiratory effort. No accessory muscle use.  Cardiovascular: Regular rate and rhythm, no murmurs / rubs / gallops. No extremity edema.   Abdomen: Suprapubic tenderness, no masses palpated. No hepatosplenomegaly. Bowel sounds positive.  Musculoskeletal: C5 paraplegia, contractures of both wrists with able to move upper extremities against gravity and across body, lacks ROM in lower extremities. No chest wall discomfort on palpation. Skin: Diaphoretic, no rashes, lesions, ulcers. No induration Neurologic: CN 2-12 grossly intact. Sensation diminished lower extremities, Strength 5/5 in upper  extremities, 0/5 lower extremities. Psychiatric: Normal judgment and insight. Alert and oriented x 3. Normal mood.   Labs on Admission: I have personally reviewed following labs and imaging studies  CBC: Recent Labs  Lab 11/27/19 1417  WBC 8.4  NEUTROABS 5.2  HGB 14.5  HCT 43.9  MCV 88.0  PLT 073   Basic Metabolic Panel: Recent Labs  Lab 11/27/19 1417  NA 141  K 3.9  CL 104  CO2 26  GLUCOSE 91  BUN 13  CREATININE 0.51*  CALCIUM 9.5   GFR: Estimated Creatinine Clearance: 118 mL/min (A) (by C-G formula based on SCr of 0.51 mg/dL (L)). Liver Function Tests: Recent Labs  Lab 11/27/19 1417  AST 16  ALT 15  ALKPHOS 45  BILITOT 0.7  PROT 8.1  ALBUMIN 4.5   Recent Labs  Lab 11/27/19 1417  LIPASE 27   No results for input(s): AMMONIA in the last 168 hours. Coagulation Profile: No results for input(s): INR, PROTIME in the last 168 hours. Cardiac Enzymes: No results for input(s): CKTOTAL, CKMB, CKMBINDEX, TROPONINI in the last 168 hours. BNP (last 3 results) No results for input(s): PROBNP in the last 8760 hours. HbA1C: No results for input(s): HGBA1C in the last 72 hours. CBG: No results for input(s): GLUCAP in the last 168 hours. Lipid Profile: No results for input(s): CHOL, HDL, LDLCALC, TRIG, CHOLHDL, LDLDIRECT in the last 72 hours. Thyroid Function Tests: No results for input(s): TSH, T4TOTAL, FREET4, T3FREE, THYROIDAB in the last 72 hours. Anemia Panel: No results for input(s): VITAMINB12, FOLATE, FERRITIN, TIBC, IRON, RETICCTPCT in the last 72 hours. Urine analysis:    Component Value Date/Time   COLORURINE YELLOW 11/27/2019 1418   APPEARANCEUR HAZY (A) 11/27/2019 1418   LABSPEC 1.017 11/27/2019 1418   PHURINE 6.0 11/27/2019 1418   GLUCOSEU NEGATIVE 11/27/2019 1418   HGBUR SMALL (A) 11/27/2019 1418   BILIRUBINUR NEGATIVE 11/27/2019 1418   KETONESUR 80 (A) 11/27/2019 1418   PROTEINUR NEGATIVE 11/27/2019 1418   UROBILINOGEN 0.2 09/05/2014 1445    NITRITE NEGATIVE 11/27/2019 1418   LEUKOCYTESUR LARGE (A) 11/27/2019 1418    Radiological Exams on Admission: DG Chest Portable 1 View  Result Date: 11/27/2019 CLINICAL DATA:  Chest pain and shortness of breath EXAM: PORTABLE CHEST 1 VIEW COMPARISON:  December 12, 2016 FINDINGS: Lungs are clear. Heart size and pulmonary vascularity are normal. No adenopathy. No pneumothorax. There is postoperative change in the lower cervical region. IMPRESSION: No edema or consolidation.  No adenopathy. Electronically Signed   By: Lowella Grip III M.D.   On: 11/27/2019 15:03    EKG: Independently reviewed. Sinus rhythm, RSR in V1-V2, not significantly changed compared to prior.  Assessment/Plan Principal  Problem:   Urinary tract infection associated with catheterization of urinary tract, initial encounter [facility]) Active Problems:   Quadriplegia (HCC)   Neurogenic bladder   Hypotension  Curtis Mann is a 33 y.o. male with medical history significant for C5 quadriplegia with neurogenic bowel and bladder who is admitted with acute UTI associated with catheterization.  Acute UTI associated with catheterization in the setting of neurogenic bladder: -Continue IV ceftriaxone -Follow urine culture  Hypotension: Mild on arrival.  Start IV fluid hydration overnight and continue to monitor.  Left side chest discomfort: EKG without acute ischemic changes and similar to prior.  Troponin is negative.  Chest x-ray is reassuring.  Suspect musculoskeletal etiology.  Neurogenic bowel with constipation: Reports last bowel movement 5 days ago.  Not on bowel regimen as an outpatient.  Check KUB, start Colace and as needed Senokot S.  C5 quadriplegia: Stable.  Continue baclofen and home Norco as needed.  DVT prophylaxis: Lovenox Code Status: Full code, confirmed with patient Family Communication: Discussed with patient, family is aware Disposition Plan: Likely discharge to home in 1-2 days Consults called:  None Admission status: Observation   Darreld Mclean MD [facility]  If 7PM-7AM, please contact night-coverage [email or web address]  11/27/2019, 7:07 PM

## 2019-11-27 NOTE — Progress Notes (Signed)
Patient arrived to unit around 2015. Norlene Duel RN, BSN

## 2019-11-27 NOTE — ED Notes (Signed)
Teg[place] Md at bedside

## 2019-11-27 NOTE — ED Notes (Signed)
Called to give report will call back after shift change

## 2019-11-27 NOTE — ED Provider Notes (Signed)
Patient was initially seen by Dr. Sherry Ruffing, please refer to his note for complete H&P.  Patient with history of quadriplegia with neurogenic bladder presenting with abdominal pain and a UA consistent with a urinary tract infection.  Patient initially to be discharged with Cipro however he report allergy to Cipro.  Pharmacy was consulted and recommend IV antibiotic.  Patient given IV antibiotic, appreciate consultation from [facility] hospitalist, Dr. Posey Pronto who agrees to see and admit patient for further care.  BP 116/76   Pulse 86   Temp 98.1 F (36.7 C) (Oral)   Resp 17   SpO2 98%   Results for orders placed or performed during the hospital encounter of 11/27/19  CBC with Differential  Result Value Ref Range   WBC 8.4 4.0 - 10.5 K/uL   RBC 4.99 4.22 - 5.81 MIL/uL   Hemoglobin 14.5 13.0 - 17.0 g/dL   HCT 43.9 39.0 - 52.0 %   MCV 88.0 80.0 - 100.0 fL   MCH 29.1 26.0 - 34.0 pg   MCHC 33.0 30.0 - 36.0 g/dL   RDW 12.9 11.5 - 15.5 %   Platelets 328 150 - 400 K/uL   nRBC 0.0 0.0 - 0.2 %   Neutrophils Relative % 61 %   Neutro Abs 5.2 1.7 - 7.7 K/uL   Lymphocytes Relative 27 %   Lymphs Abs 2.2 0.7 - 4.0 K/uL   Monocytes Relative 11 %   Monocytes Absolute 0.9 0.1 - 1.0 K/uL   Eosinophils Relative 1 %   Eosinophils Absolute 0.1 0.0 - 0.5 K/uL   Basophils Relative 0 %   Basophils Absolute 0.0 0.0 - 0.1 K/uL   Immature Granulocytes 0 %   Abs Immature Granulocytes 0.02 0.00 - 0.07 K/uL  Comprehensive metabolic panel  Result Value Ref Range   Sodium 141 135 - 145 mmol/L   Potassium 3.9 3.5 - 5.1 mmol/L   Chloride 104 98 - 111 mmol/L   CO2 26 22 - 32 mmol/L   Glucose, Bld 91 70 - 99 mg/dL   BUN 13 6 - 20 mg/dL   Creatinine, Ser 0.51 (L) 0.61 - 1.24 mg/dL   Calcium 9.5 8.9 - 10.3 mg/dL   Total Protein 8.1 6.5 - 8.1 g/dL   Albumin 4.5 3.5 - 5.0 g/dL   AST 16 15 - 41 U/L   ALT 15 0 - 44 U/L   Alkaline Phosphatase 45 38 - 126 U/L   Total Bilirubin 0.7 0.3 - 1.2 mg/dL   GFR calc non Af Amer  >60 >60 mL/min   GFR calc Af Amer >60 >60 mL/min   Anion gap 11 5 - 15  Lipase, blood  Result Value Ref Range   Lipase 27 11 - 51 U/L  Lactic acid, plasma  Result Value Ref Range   Lactic Acid, Venous 1.0 0.5 - 1.9 mmol/L  Lactic acid, plasma  Result Value Ref Range   Lactic Acid, Venous 0.8 0.5 - 1.9 mmol/L  D-dimer, quantitative (not at [facility])  Result Value Ref Range   D-Dimer, Quant 0.30 0.00 - 0.50 ug/mL-FEU  Brain natriuretic peptide  Result Value Ref Range   B Natriuretic Peptide 16.2 0.0 - 100.0 pg/mL  Urinalysis, Routine w reflex microscopic  Result Value Ref Range   Color, Urine YELLOW YELLOW   APPearance HAZY (A) CLEAR   Specific Gravity, Urine 1.017 1.005 - 1.030   pH 6.0 5.0 - 8.0   Glucose, UA NEGATIVE NEGATIVE mg/dL   Hgb urine dipstick SMALL (A)  NEGATIVE   Bilirubin Urine NEGATIVE NEGATIVE   Ketones, ur 80 (A) NEGATIVE mg/dL   Protein, ur NEGATIVE NEGATIVE mg/dL   Nitrite NEGATIVE NEGATIVE   Leukocytes,Ua LARGE (A) NEGATIVE   RBC / HPF 11-20 0 - 5 RBC/hpf   WBC, UA >50 (H) 0 - 5 WBC/hpf   Bacteria, UA MANY (A) NONE SEEN   Mucus PRESENT   POC SARS Coronavirus 2 Ag-ED - Nasal Swab (BD Veritor Kit)  Result Value Ref Range   SARS Coronavirus 2 Ag NEGATIVE NEGATIVE  Troponin I (High Sensitivity)  Result Value Ref Range   Troponin I (High Sensitivity) 8 <18 ng/L  Troponin I (High Sensitivity)  Result Value Ref Range   Troponin I (High Sensitivity) 8 <18 ng/L   DG Chest Portable 1 View  Result Date: 11/27/2019 CLINICAL DATA:  Chest pain and shortness of breath EXAM: PORTABLE CHEST 1 VIEW COMPARISON:  December 12, 2016 FINDINGS: Lungs are clear. Heart size and pulmonary vascularity are normal. No adenopathy. No pneumothorax. There is postoperative change in the lower cervical region. IMPRESSION: No edema or consolidation.  No adenopathy. Electronically Signed   By: Lowella Grip III M.D.   On: 11/27/2019 15:03      Domenic Moras, PA-C 11/27/19 1817     Dorie Rank, MD 11/28/19 [phone]

## 2019-11-27 NOTE — ED Notes (Signed)
Pt provided snacks and drinks 

## 2019-11-27 NOTE — ED Notes (Signed)
Called to give repot but receiving nurse busy will call back

## 2019-11-27 NOTE — ED Provider Notes (Signed)
[facility]-EMERGENCY DEPT Provider Note   CSN: [id number] Arrival date & time: 11/27/19  1246     History Chief Complaint  Patient presents with  . Dysuria  . Chest Pain    Curtis Mann is a 33 y.o. male.  The history is provided by the patient and medical records. No language interpreter was used.  Dysuria Presenting symptoms: dysuria   Relieved by:  Nothing Worsened by:  Nothing Ineffective treatments:  None tried Associated symptoms: abdominal pain, nausea and urinary retention   Associated symptoms: no diarrhea, no fever, no flank pain and no vomiting        Past Medical History:  Diagnosis Date  . Complication of anesthesia    in 2015 patient states he woke up immediately after surgery , 2016- took him 30 minutes to wake up   . Hypotension occasional  . Neurogenic bladder    foley and i and o caths  . Neurogenic bladder    i and o caths 6-8 times day  . Neurogenic bowel    bowel program with digital stimulation as needed  . Neuromuscular disorder (HCC)    Quadriplegic uses electric wheelchair, Nurse, adult or Two person Transfer  . 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 UTI (urinary tract infection) 05/28/2018  . Leukocytosis 12/13/2016  . Community acquired pneumonia of right lower lobe of lung   . Quadriplegia (HCC)   . 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 CAP (community acquired pneumonia) 12/12/2016    Past Surgical History:  Procedure Laterality Date  . bicep and tricept tendon transfers bilateral  2011  . bladder botox surgery  2012  . BOTOX INJECTION N/A 03/06/2018   Procedure: BOTOX INJECTION CYSTOSCOPY WITH FULGURATION;  Surgeon: Alfredo Martinez, MD;  Location: [facility];  Service: Urology;  Laterality: N/A;  . BOTOX INJECTION N/A 05/14/2019   Procedure: CYSTOSCOPY BOTOX INJECTION;  Surgeon: Alfredo Martinez, MD;  Location: [facility];  Service: Urology;   Laterality: N/A;  . CYSTOSCOPY N/A 01/20/2014   Procedure: CYSTOSCOPY WITH BOTOX INJECTION;  Surgeon: Kathi Ludwig, MD;  Location: [facility];  Service: Urology;  Laterality: N/A;  . CYSTOSCOPY N/A 04/09/2015   Procedure: CYSTOSCOPY WITH BOTOX INJECTION;  Surgeon: Jethro Bolus, MD;  Location: [facility];  Service: Urology;  Laterality: N/A;  . CYSTOSCOPY WITH INJECTION  10/11/2012   Procedure: CYSTOSCOPY WITH INJECTION;  Surgeon: Kathi Ludwig, MD;  Location: [facility];  Service: Urology;  Laterality: N/A;  botox  . CYSTOSCOPY WITH INJECTION N/A 04/29/2016   Procedure: CYSTOSCOPY WITH INJECTION;  Surgeon: Jethro Bolus, MD;  Location: [facility];  Service: Urology;  Laterality: N/A;  . 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 VASECTOMY Bilateral 05/14/2019   Procedure: VASECTOMY;  Surgeon: Alfredo Martinez, MD;  Location: [facility];  Service: Urology;  Laterality: Bilateral;       Family History  Problem Relation Age of Onset  . Colon cancer Mother   . Pulmonary embolism Mother   . Hypertension Father   . Congestive Heart Failure Paternal Grandfather     Social History   Tobacco Use  . Smoking status: Former Smoker    Packs/day: 0.25    Years: 4.00    Pack years: 1.00    Types: Cigarettes    Quit date: 12/13/2007    Years since quitting: 11.9  . Smokeless tobacco: Never Used  Substance Use Topics  . Alcohol use: No  . Drug use: No    Home Medications Prior to Admission medications   Medication Sig Start Date End Date Taking? Authorizing Provider  baclofen (LIORESAL) 20 MG tablet Take 20 mg by mouth 3 (three) times daily.    [provider]  cholecalciferol (VITAMIN D3) 25 MCG (1000 UT) tablet Take 1,000 Units by mouth daily.    [provider]  HYDROcodone-acetaminophen (NORCO) 5-325 MG tablet Take 1-2 tablets by mouth every 6 (six) hours as needed for moderate pain. 05/14/19   Alfredo Martinez, MD  HYDROcodone-acetaminophen (NORCO/VICODIN)  5-325 MG per tablet Take 1 tablet by mouth every 6 (six) hours as needed for moderate pain.     [provider]  loratadine-pseudoephedrine (CLARITIN-D 24-HOUR) 10-240 MG 24 hr tablet Take 1 tablet by mouth daily as needed for allergies.    [provider]  Multiple Vitamin (MULTIVITAMIN WITH MINERALS) TABS tablet Take 1 tablet by mouth daily.    [provider]  oxybutynin (DITROPAN XL) 15 MG 24 hr tablet Take 15 mg by mouth daily as needed (self catheter).     [provider]    Allergies    Amoxicillin, Bactrim [sulfamethoxazole-trimethoprim], and Penicillins  Review of Systems   Review of Systems  Constitutional: Positive for chills and fatigue. Negative for fever.  HENT: Negative for congestion.   Respiratory: Positive for shortness of breath. Negative for cough, chest tightness and wheezing.   Cardiovascular: Positive for chest pain. Negative for palpitations and leg swelling.  Gastrointestinal: Positive for abdominal pain and nausea. Negative for constipation, diarrhea and vomiting.  Genitourinary: Positive for decreased urine volume and dysuria. Negative for flank pain.  Musculoskeletal: Negative for back pain, neck pain and neck stiffness.  Neurological: Negative for light-headedness and headaches.  Psychiatric/Behavioral: Negative for agitation and confusion.  All other systems reviewed and are negative.   Physical Exam Updated Vital Signs BP (!) 90/43 (BP Location: Left Arm)   Pulse 90   Temp 98.1 F (36.7 C) (Oral)   Resp 16   SpO2 96%   Physical Exam Vitals and nursing note reviewed.  Constitutional:      General: He is not in acute distress.    Appearance: He is well-developed. He is not ill-appearing, toxic-appearing or diaphoretic.  HENT:     Head: Normocephalic and atraumatic.     Nose: No congestion or rhinorrhea.     Mouth/Throat:     Mouth: Mucous membranes are moist.     Pharynx: No oropharyngeal exudate or posterior  oropharyngeal erythema.  Eyes:     Extraocular Movements: Extraocular movements intact.     Conjunctiva/sclera: Conjunctivae normal.     Pupils: Pupils are equal, round, and reactive to light.  Cardiovascular:     Rate and Rhythm: Normal rate and regular rhythm.     Heart sounds: Normal heart sounds. No murmur.  Pulmonary:     Effort: Pulmonary effort is normal. No respiratory distress.     Breath sounds: Normal breath sounds. No decreased breath sounds, wheezing, rhonchi or rales.  Abdominal:     General: Abdomen is flat. There is no distension.     Palpations: Abdomen is soft.     Tenderness: There is abdominal tenderness.  Musculoskeletal:        General: No tenderness.     Cervical back: Normal range of motion and neck supple.     Right lower leg: No tenderness. No edema.  Left lower leg: No tenderness. No edema.  Skin:    General: Skin is warm and dry.     Capillary Refill: Capillary refill takes less than 2 seconds.  Neurological:     Mental Status: He is alert. Mental status is at baseline.  Psychiatric:        Mood and Affect: Mood normal.     ED Results / Procedures / Treatments   Labs (all labs ordered are listed, but only abnormal results are displayed) Labs Reviewed  COMPREHENSIVE METABOLIC PANEL - Abnormal; Notable for the following components:      Result Value   Creatinine, Ser 0.51 (*)    All other components within normal limits  URINALYSIS, ROUTINE W REFLEX MICROSCOPIC - Abnormal; Notable for the following components:   APPearance HAZY (*)    Hgb urine dipstick SMALL (*)    Ketones, ur 80 (*)    Leukocytes,Ua LARGE (*)    WBC, UA >50 (*)    Bacteria, UA MANY (*)    All other components within normal limits  URINE CULTURE  NOVEL CORONAVIRUS, NAA (HOSP ORDER, SEND-OUT TO REF LAB; TAT 18-24 HRS)  CBC WITH DIFFERENTIAL/PLATELET  LIPASE, BLOOD  LACTIC ACID, PLASMA  LACTIC ACID, PLASMA  D-DIMER, QUANTITATIVE (NOT AT [facility])  BRAIN NATRIURETIC PEPTIDE    POC SARS CORONAVIRUS 2 AG -  ED  TROPONIN I (HIGH SENSITIVITY)  TROPONIN I (HIGH SENSITIVITY)    EKG EKG Interpretation  Date/Time:  Wednesday November 27 2019 13:14:46 EST Ventricular Rate:  88 PR Interval:    QRS Duration: 102 QT Interval:  369 QTC Calculation: 447 R Axis:   67 Text Interpretation: Sinus rhythm RSR' in V1 or V2, right VCD or RVH ST elev, probable normal early repol pattern When compraed to prior, no signficiant changes seen. No STEMI Confirmed by Theda Belfast ([id number]) on 11/27/2019 3:04:20 PM   Radiology DG Chest Portable 1 View  Result Date: 11/27/2019 CLINICAL DATA:  Chest pain and shortness of breath EXAM: PORTABLE CHEST 1 VIEW COMPARISON:  December 12, 2016 FINDINGS: Lungs are clear. Heart size and pulmonary vascularity are normal. No adenopathy. No pneumothorax. There is postoperative change in the lower cervical region. IMPRESSION: No edema or consolidation.  No adenopathy. Electronically Signed   By: Bretta Bang III M.D.   On: 11/27/2019 15:03    Procedures Procedures (including critical care time)  Medications Ordered in ED Medications  ciprofloxacin (CIPRO) tablet 500 mg (has no administration in time range)  cefTRIAXone (ROCEPHIN) 1 g in sodium chloride 0.9 % 100 mL IVPB (has no administration in time range)    ED Course  I have reviewed the triage vital signs and the nursing notes.  Pertinent labs & imaging results that were available during my care of the patient were reviewed by me and considered in my medical decision making (see chart for details).    MDM Rules/Calculators/A&P                      CREW GOREN is a 33 y.o. male with a past medical history significant for prior neck injury with spastic quadriparesis and neurogenic bladder with intermittent catheter dependence who presents with abdominal discomfort, dysuria, left-sided chest pain, shortness of breath, diaphoresis, and fatigue.  Patient reports that his symptoms been  ongoing for the last several days.  He reports that he placed a Foley catheter by himself last week and he has do this every now and then.  He  reports that he has been having dysuria with it.  He thinks he has a urinary tract infection with lower abdominal discomfort and pain.  He reports no significant constipation or diarrhea.  He does report for the last few days he has had some chest pain and shortness of breath in his left chest.  It is very pleuritic and worse with deep breathing.  He denies any history of DVT or PE.  He is relatively immobile with his quadriplegia.  He denies new leg pain or leg swelling.  He denies fevers or chills.  He does report some fatigue.  Of note, patient had a son 2 days ago and has had increase in stress.  On exam, lungs are clear and chest is nontender.  Abdomen is nontender on my exam.  Patient had good pulses in all extremities.  Legs were not significantly edematous.  EKG showed no STEMI.  Together we agreed to get a urinalysis as well as labs and imaging to look for concerning etiology of the chest discomfort.  Clinical aspect a combination of anxiety, stress, and musculoskeletal discomfort causing his chest pain.  Will get chest x-ray, D-dimer, troponins, and will monitor patient.   Initial coronavirus test is negative.  Will send PCR.  BNP is not elevated.  Lactic acid is normal.  Troponin is negative.  Lipase is nonelevated.  Urinalysis does show leukocytes and bacteria, given his history of UTIs and dysuria, will treat for UTI.  D-dimer was negative, doubt thromboembolic etiology.  Creatinine not elevated and metabolic panel reassuring.  Chest x-ray shows no pneumonia.  If delta troponin is negative, anticipate discharge for outpatient follow-up for the chest discomfort.  We will give antibiotics for the urinary tract infection.  5:22 PM Chart review shows that his last urine culture showed infection that was sensitive to Cipro.  Chart review also shows the  patient had an intolerance to Cipro previously but that was removed after he tolerated it well.  We agreed to have him take a dose of oral Cipro now to see if he has a reaction, if not, he will be discharged with Cipro for his outpatient treatment of UTI.  Still awaiting delta troponin.  If this is negative, anticipate discharge home.  Care transferred to Dr. Tomi Bamberger while awaiting for delta troponin and to monitor for reaction from the Cipro.  5:48 PM While waiting to get his Cipro, patient reports more concerned not only for allergic reaction but that the Cipro does not work for him.  He reports that every time he takes Cipro in the past, the infections come back quickly.  He does not feel comfortable taking Cipro and going home given his continued abdominal pain.  When I spoke to pharmacy, they felt the only other option would be IV Rocephin and admission to the hospital for IV antibiotics.  Patient would rather be admitted for IV antibiotics for his UTI causing pain.  Patient will be admitted for IV antibiotics.    Final Clinical Impression(s) / ED Diagnoses Final diagnoses:  Acute cystitis without hematuria  Abdominal pain, unspecified abdominal location  Chest pain, unspecified type     Clinical Impression: 1. Acute cystitis without hematuria   2. Abdominal pain, unspecified abdominal location   3. Chest pain, unspecified type     Disposition: Care transferred to Dr. Tomi Bamberger while awaiting results of delta troponin and to monitor for reaction from the Cipro.  If there is no reaction, dissipate discharge for outpatient antibiotic treatment of  UTI with Cipro.  This note was prepared with assistance of Conservation officer, historic buildingsDragon voice recognition software. Occasional wrong-word or sound-a-like substitutions may have occurred due to the inherent limitations of voice recognition software.     Georgann Bramble, Canary Brimhristopher J, MD 11/27/19 1750

## 2019-11-27 NOTE — ED Provider Notes (Signed)
Pt seen by Dr Sherry Ruffing.  Pt has several allergies.  Has reaction to cipro in the past.  Case was discussed with pharmacy who recommended iv abx, rocephin admission.  Will consult with medical service.   Dorie Rank, MD 11/27/19 [phone]

## 2019-11-27 NOTE — ED Triage Notes (Signed)
Pt reports having bladder pains and might have infection at catheter site for couple days. Also having left chest pains and SOB for couple days.

## 2019-11-28 DIAGNOSIS — Z881 Allergy status to other antibiotic agents status: Secondary | ICD-10-CM | POA: Diagnosis not present

## 2019-11-28 DIAGNOSIS — Z79899 Other long term (current) drug therapy: Secondary | ICD-10-CM | POA: Diagnosis not present

## 2019-11-28 DIAGNOSIS — Z681 Body mass index (BMI) 19 or less, adult: Secondary | ICD-10-CM | POA: Diagnosis not present

## 2019-11-28 DIAGNOSIS — G8250 Quadriplegia, unspecified: Secondary | ICD-10-CM | POA: Diagnosis present

## 2019-11-28 DIAGNOSIS — R079 Chest pain, unspecified: Secondary | ICD-10-CM

## 2019-11-28 DIAGNOSIS — Z8249 Family history of ischemic heart disease and other diseases of the circulatory system: Secondary | ICD-10-CM | POA: Diagnosis not present

## 2019-11-28 DIAGNOSIS — Y846 Urinary catheterization as the cause of abnormal reaction of the patient, or of later complication, without mention of misadventure at the time of the procedure: Secondary | ICD-10-CM | POA: Diagnosis present

## 2019-11-28 DIAGNOSIS — I959 Hypotension, unspecified: Secondary | ICD-10-CM | POA: Diagnosis present

## 2019-11-28 DIAGNOSIS — Z20828 Contact with and (suspected) exposure to other viral communicable diseases: Secondary | ICD-10-CM | POA: Diagnosis present

## 2019-11-28 DIAGNOSIS — R0789 Other chest pain: Secondary | ICD-10-CM | POA: Diagnosis present

## 2019-11-28 DIAGNOSIS — N10 Acute tubulo-interstitial nephritis: Secondary | ICD-10-CM | POA: Diagnosis present

## 2019-11-28 DIAGNOSIS — T83518A Infection and inflammatory reaction due to other urinary catheter, initial encounter: Secondary | ICD-10-CM | POA: Diagnosis present

## 2019-11-28 DIAGNOSIS — N3000 Acute cystitis without hematuria: Secondary | ICD-10-CM

## 2019-11-28 DIAGNOSIS — Z87891 Personal history of nicotine dependence: Secondary | ICD-10-CM | POA: Diagnosis not present

## 2019-11-28 DIAGNOSIS — R369 Urethral discharge, unspecified: Secondary | ICD-10-CM | POA: Diagnosis present

## 2019-11-28 DIAGNOSIS — R109 Unspecified abdominal pain: Secondary | ICD-10-CM

## 2019-11-28 DIAGNOSIS — Z800 Family history of malignant neoplasm of digestive organs: Secondary | ICD-10-CM | POA: Diagnosis not present

## 2019-11-28 DIAGNOSIS — Z880 Allergy status to penicillin: Secondary | ICD-10-CM | POA: Diagnosis not present

## 2019-11-28 DIAGNOSIS — K592 Neurogenic bowel, not elsewhere classified: Secondary | ICD-10-CM | POA: Diagnosis present

## 2019-11-28 DIAGNOSIS — N319 Neuromuscular dysfunction of bladder, unspecified: Secondary | ICD-10-CM | POA: Diagnosis present

## 2019-11-28 DIAGNOSIS — N12 Tubulo-interstitial nephritis, not specified as acute or chronic: Secondary | ICD-10-CM | POA: Insufficient documentation

## 2019-11-28 DIAGNOSIS — Z882 Allergy status to sulfonamides status: Secondary | ICD-10-CM | POA: Diagnosis not present

## 2019-11-28 DIAGNOSIS — N390 Urinary tract infection, site not specified: Secondary | ICD-10-CM | POA: Diagnosis present

## 2019-11-28 DIAGNOSIS — Z8701 Personal history of pneumonia (recurrent): Secondary | ICD-10-CM | POA: Diagnosis not present

## 2019-11-28 DIAGNOSIS — Z993 Dependence on wheelchair: Secondary | ICD-10-CM | POA: Diagnosis not present

## 2019-11-28 DIAGNOSIS — E441 Mild protein-calorie malnutrition: Secondary | ICD-10-CM | POA: Diagnosis present

## 2019-11-28 DIAGNOSIS — K5900 Constipation, unspecified: Secondary | ICD-10-CM | POA: Diagnosis present

## 2019-11-28 LAB — CBC
HCT: 42.6 % (ref 39.0–52.0)
Hemoglobin: 13.8 g/dL (ref 13.0–17.0)
MCH: 29.1 pg (ref 26.0–34.0)
MCHC: 32.4 g/dL (ref 30.0–36.0)
MCV: 89.7 fL (ref 80.0–100.0)
Platelets: 270 10*3/uL (ref 150–400)
RBC: 4.75 MIL/uL (ref 4.22–5.81)
RDW: 12.8 % (ref 11.5–15.5)
WBC: 7.4 10*3/uL (ref 4.0–10.5)
nRBC: 0 % (ref 0.0–0.2)

## 2019-11-28 LAB — BASIC METABOLIC PANEL
Anion gap: 12 (ref 5–15)
BUN: 13 mg/dL (ref 6–20)
CO2: 24 mmol/L (ref 22–32)
Calcium: 8.7 mg/dL — ABNORMAL LOW (ref 8.9–10.3)
Chloride: 104 mmol/L (ref 98–111)
Creatinine, Ser: 0.48 mg/dL — ABNORMAL LOW (ref 0.61–1.24)
GFR calc Af Amer: >60 mL/min (ref >60)
GFR calc non Af Amer: >60 mL/min (ref >60)
Glucose, Bld: 84 mg/dL (ref 70–99)
Potassium: 3.8 mmol/L (ref 3.5–5.1)
Sodium: 140 mmol/L (ref 135–145)

## 2019-11-28 LAB — NOVEL CORONAVIRUS, NAA (HOSP ORDER, SEND-OUT TO REF LAB; TAT 18-24 HRS): SARS-CoV-2, NAA: NOT DETECTED

## 2019-11-28 LAB — HIV ANTIBODY (ROUTINE TESTING W REFLEX): HIV Screen 4th Generation wRfx: NONREACTIVE

## 2019-11-28 MED ORDER — ZOLPIDEM TARTRATE 5 MG PO TABS
5.0000 mg | ORAL_TABLET | Freq: Once | ORAL | Status: AC
  Administered 2019-11-29: 5 mg via ORAL
  Filled 2019-11-28: qty 1

## 2019-11-28 MED ORDER — CHLORHEXIDINE GLUCONATE CLOTH 2 % EX PADS
6.0000 | MEDICATED_PAD | Freq: Every day | CUTANEOUS | Status: DC
  Administered 2019-11-28 – 2019-11-29 (×2): 6 via TOPICAL

## 2019-11-28 MED ORDER — SODIUM CHLORIDE 0.9 % IV SOLN
2.0000 g | INTRAVENOUS | Status: DC
  Administered 2019-11-28 – 2019-11-29 (×2): 2 g via INTRAVENOUS
  Filled 2019-11-28: qty 20
  Filled 2019-11-28 (×2): qty 2
  Filled 2019-11-28: qty 20

## 2019-11-28 MED ORDER — BISACODYL 10 MG RE SUPP
10.0000 mg | Freq: Every day | RECTAL | Status: DC | PRN
  Administered 2019-11-28: 10 mg via RECTAL
  Filled 2019-11-28 (×2): qty 1

## 2019-11-28 NOTE — Progress Notes (Signed)
[facility]                                                                              Patient Demographics  Curtis Mann, is a 33 y.o. male, DOB - [birth year], WNI:[id number]  Admit date - 11/27/2019   Admitting Physician Curtis Pew, MD  Outpatient Primary MD for the patient is Curtis Jenny, MD  Outpatient specialists:   LOS - 0  days   Medical records reviewed and are as summarized below:    Chief Complaint  Patient presents with  . Dysuria  . 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 Patient alternates in and out catheterization for 2 weeks with Foley catheter placement every 2 weeks.  He says he last placed a Foley catheter 11/22/2019.  He is also on daily nitrofurantoin for UTI prophylaxis.   Patient reports experiencing bladder discomfort approximately 3 days ago.  He noticed seeing purulent penile discharge.  He has been experiencing chills and diaphoresis.  He says these are the usual symptoms he gets when he has a UTI.  He tried doubling of his nitrofurantoin for his urologist recommendation without significant relief.  He also notes having intermittent left-sided chest pain without radiation and occasional shortness of breath.  He has checked his oxygen at home with lowest reading of 96%.  He is not taking a bowel regimen and says his last bowel movement was on 11/22/2019 with a substantial amount of stool. He was last admitted in June 2019 for UTI at which time urine cultures grew Proteus vulgaris which was resistant to ampicillin, cefazolin, and nitrofurantoin, otherwise sensitive.  He was treated with and tolerated IV ceftriaxone at that time  In ED, BP 90/43, pulse 90, temp 98.1 F, WBCs 8.4, hemoglobin 14.5 D-dimer 0.3.  UA positive for UTI  Assessment & Plan     Principal Problem:   Acute pyelonephritis, acute UTI associated with catheterization in the setting of neurogenic bladder -Patient was placed on IV ceftriaxone, follow urine culture and sensitivities -Patient also reporting left flank pain, likely has pyelonephritis due to complicated UTI -Obtain blood cultures (patient has received IV ceftriaxone), increased IV Rocephin to 2 g IV daily -Currently no fever chills or leukocytosis   Active Problems:   Hypotension -Likely due to #1, patient was placed on IV fluid hydration, BP now improving   mild left-sided chest discomfort -Improved, troponin negative, chest x-ray reassuring, EKG with no acute ST-T wave changes -Possibly musculoskeletal or referred from acute pyelonephritis  Neurogenic bladder with quadriplegia (HCC), neurogenic bowel with constipation -KUB negative for any obstruction -Continue bowel regimen   Protein calorie malnutrition -BMI 18.9, consult dietitian, placed on nutrition supplements   Code Status: Full CODE STATUS DVT Prophylaxis: Lovenox Family Communication: Discussed all imaging results, lab results, explained to the patient    Disposition Plan: Complaining of left flank pain, likely has pyelonephritis.  Await blood cultures, urine culture and sensitivity.  Hopefully  DC home in 24 to 48 hours if continues to improve  Time Spent in minutes   25 minutes  Procedures:  None  Consultants:   None  Antimicrobials:   Anti-infectives (From admission, onward)   Start     Dose/Rate Route Frequency Ordered Stop   11/28/19 1800  cefTRIAXone (ROCEPHIN) 1 g in sodium chloride 0.9 % 100 mL IVPB  Status:  Discontinued     1 g 200 mL/hr over 30 Minutes Intravenous Every 24 hours 11/27/19 1849 11/28/19 0835   11/28/19 1800  cefTRIAXone (ROCEPHIN) 2 g in sodium chloride 0.9 % 100 mL IVPB     2 g 200 mL/hr over 30 Minutes Intravenous Every 24 hours 11/28/19 0835     11/27/19 1800  cefTRIAXone (ROCEPHIN) 1 g in  sodium chloride 0.9 % 100 mL IVPB     1 g 200 mL/hr over 30 Minutes Intravenous  Once 11/27/19 1747 11/27/19 1854   11/27/19 1730  ciprofloxacin (CIPRO) tablet 500 mg     500 mg Oral  Once 11/27/19 1722 11/27/19 1804   11/27/19 0000  ciprofloxacin (CIPRO) 500 MG tablet  Status:  Discontinued     500 mg Oral 2 times daily 11/27/19 1726 11/27/19          Medications  Scheduled Meds: . baclofen  20 mg Oral TID  . Chlorhexidine Gluconate Cloth  6 each Topical Daily  . docusate sodium  100 mg Oral BID  . enoxaparin (LOVENOX) injection  40 mg Subcutaneous Q24H  . multivitamin with minerals  1 tablet Oral Daily   Continuous Infusions: . cefTRIAXone (ROCEPHIN)  IV     PRN Meds:.acetaminophen **OR** acetaminophen, HYDROcodone-acetaminophen, ondansetron **OR** ondansetron (ZOFRAN) IV, oxybutynin, senna-docusate      Subjective:   Curtis Mann was seen and examined today.  Patient reported flank fullness and pain, left worse than right.  No nausea or vomiting.  No fevers or chills.  Patient denies dizziness, chest pain, shortness of breath. No acute events overnight.    Objective:   Vitals:   11/27/19 1900 11/27/19 1901 11/27/19 2043 11/28/19 0540  BP: (!) 127/54  (!) 88/56 118/72  Pulse: 80  83 75  Resp: Temp:   97.8 F (36.6 C) 98.3 F (36.8 C)  TempSrc:   Oral Oral  SpO2: 99%  97% 98%  Weight:  63.5 kg    Height:  6' (1.829 m)      Intake/Output Summary (Last 24 hours) at 11/28/2019 1218 Last data filed at 11/28/2019 1010 Gross per 24 hour  Intake 966.67 ml  Output 650 ml  Net 316.67 ml     Wt Readings from Last 3 Encounters:  11/27/19 63.5 kg  05/14/19 62.6 kg  05/28/18 65.5 kg     Exam  General: Alert and oriented x 3, NAD  Eyes:   HEENT:  Atraumatic, normocephalic  Cardiovascular: S1 S2 auscultated, no murmurs, RRR  Respiratory: Clear to auscultation bilaterally, no wheezing, rales or rhonchi  Gastrointestinal: Soft, nontender,  nondistended, + bowel sounds, mild left CVAT  Ext: no pedal edema bilaterally  Neuro: C5 paraplegia, contractures of both wrists and lower extremities.  Able to move upper extremities against gravity.  Strength 0/5 in the lower extremities  Musculoskeletal: No digital cyanosis, clubbing  Skin: No rashes  Psych: Normal affect and demeanor, alert and oriented x3    Data Reviewed:  I have personally reviewed following labs and imaging studies  Micro Results Recent Results (from  the past 240 hour(s))  Novel Coronavirus, NAA (Hosp order, Send-out to Ref Lab; TAT 18-24 hrs     Status: None   Collection Time: 11/27/19  6:06 PM   Specimen: Nasopharyngeal Swab; Respiratory  Result Value Ref Range Status   SARS-CoV-2, NAA NOT DETECTED NOT DETECTED Final    Comment: (NOTE) This nucleic acid amplification test was developed and its performance characteristics determined by [employer]. Nucleic acid amplification tests include PCR and TMA. This test has not been FDA cleared or approved. This test has been authorized by FDA under an Emergency Use Authorization (EUA). This test is only authorized for the duration of time the declaration that circumstances exist justifying the authorization of the emergency use of in vitro diagnostic tests for detection of SARS-CoV-2 virus and/or diagnosis of COVID-19 infection under section 564(b)(1) of the Act, 21 U.S.C. 563OVF-6(E) (1), unless the authorization is terminated or revoked sooner. When diagnostic testing is negative, the possibility of a false negative result should be considered in the context of a patient's recent exposures and the presence of clinical signs and symptoms consistent with COVID-19. An individual without symptoms of COVID- 19 and who is not shedding SARS-CoV-2 vi rus would expect to have a negative (not detected) result in this assay. Performed At: [facility] [address] [place], [place] [id number] Katina Degree  MDPhD [id number]    Coronavirus Source NASOPHARYNGEAL  Final    Comment: Performed at [facility], [place] [address], [place], [place] [address]    Radiology Reports Abd 1 View (KUB)  Result Date: 11/27/2019 CLINICAL DATA:  Constipation EXAM: ABDOMEN - 1 VIEW COMPARISON:  CT 05/28/2018 FINDINGS: Nonobstructive bowel gas pattern. No organomegaly or free air. No suspicious calcification. No significant increase in stool burden. No acute bony abnormality. IMPRESSION: No acute findings. Electronically Signed   By: Rolm Baptise M.D.   On: 11/27/2019 19:35   DG Chest Portable 1 View  Result Date: 11/27/2019 CLINICAL DATA:  Chest pain and shortness of breath EXAM: PORTABLE CHEST 1 VIEW COMPARISON:  December 12, 2016 FINDINGS: Lungs are clear. Heart size and pulmonary vascularity are normal. No adenopathy. No pneumothorax. There is postoperative change in the lower cervical region. IMPRESSION: No edema or consolidation.  No adenopathy. Electronically Signed   By: Lowella Grip III M.D.   On: 11/27/2019 15:03    Lab Data:  CBC: Recent Labs  Lab 11/27/19 1417 11/28/19 0435  WBC 8.4 7.4  NEUTROABS 5.2  --   HGB 14.5 13.8  HCT 43.9 42.6  MCV 88.0 89.7  PLT 328 573   Basic Metabolic Panel: Recent Labs  Lab 11/27/19 1417 11/28/19 0435  NA 141 140  K 3.9 3.8  CL 104 104  CO2 26 24  GLUCOSE 91 84  BUN 13 13  CREATININE 0.51* 0.48*  CALCIUM 9.5 8.7*   GFR: Estimated Creatinine Clearance: 118 mL/min (A) (by C-G formula based on SCr of 0.48 mg/dL (L)). Liver Function Tests: Recent Labs  Lab 11/27/19 1417  AST 16  ALT 15  ALKPHOS 45  BILITOT 0.7  PROT 8.1  ALBUMIN 4.5   Recent Labs  Lab 11/27/19 1417  LIPASE 27   No results for input(s): AMMONIA in the last 168 hours. Coagulation Profile: No results for input(s): INR, PROTIME in the last 168 hours. Cardiac Enzymes: No results for input(s): CKTOTAL, CKMB, CKMBINDEX, TROPONINI in the last 168  hours. BNP (last 3 results) No results for input(s): PROBNP in the last  8760 hours. HbA1C: No results for input(s): HGBA1C in the last 72 hours. CBG: No results for input(s): GLUCAP in the last 168 hours. Lipid Profile: No results for input(s): CHOL, HDL, LDLCALC, TRIG, CHOLHDL, LDLDIRECT in the last 72 hours. Thyroid Function Tests: No results for input(s): TSH, T4TOTAL, FREET4, T3FREE, THYROIDAB in the last 72 hours. Anemia Panel: No results for input(s): VITAMINB12, FOLATE, FERRITIN, TIBC, IRON, RETICCTPCT in the last 72 hours. Urine analysis:    Component Value Date/Time   COLORURINE YELLOW 11/27/2019 1418   APPEARANCEUR HAZY (A) 11/27/2019 1418   LABSPEC 1.017 11/27/2019 1418   PHURINE 6.0 11/27/2019 1418   GLUCOSEU NEGATIVE 11/27/2019 1418   HGBUR SMALL (A) 11/27/2019 1418   BILIRUBINUR NEGATIVE 11/27/2019 1418   KETONESUR 80 (A) 11/27/2019 1418   PROTEINUR NEGATIVE 11/27/2019 1418   UROBILINOGEN 0.2 09/05/2014 1445   NITRITE NEGATIVE 11/27/2019 1418   LEUKOCYTESUR LARGE (A) 11/27/2019 1418     Yadira Hada M.D. [facility] 11/28/2019, 12:18 PM   Call night coverage person covering after 7pm

## 2019-11-28 NOTE — Progress Notes (Signed)
Patient requesting day nurse to do a digital stimulation to help him have a BM. Pt states he has not had a BM since 12/11 and his BP is higher than usual. Day RN spoke with Dr. Tana Coast, pts attending and she ordered a suppository during shift change. Pt is refusing suppository and wants to speak with [facility]. AC has been called, will continue to monitor.

## 2019-11-29 LAB — URINE CULTURE: Culture: >=100000 — AB

## 2019-11-29 MED ORDER — DOCUSATE SODIUM 100 MG PO CAPS: 100.0000 mg | ORAL_CAPSULE | Freq: Two times a day (BID) | ORAL | 4 refills | Status: DC

## 2019-11-29 MED ORDER — SENNOSIDES-DOCUSATE SODIUM 8.6-50 MG PO TABS
1.0000 | ORAL_TABLET | Freq: Two times a day (BID) | ORAL | Status: DC
  Administered 2019-11-29: 1 via ORAL
  Filled 2019-11-29: qty 1

## 2019-11-29 MED ORDER — CEFPODOXIME PROXETIL 100 MG PO TABS: 100.0000 mg | ORAL_TABLET | Freq: Two times a day (BID) | ORAL | 0 refills | Status: AC

## 2019-11-29 MED ORDER — KETOROLAC TROMETHAMINE 30 MG/ML IJ SOLN
30.0000 mg | Freq: Once | INTRAMUSCULAR | Status: AC
  Administered 2019-11-29: 30 mg via INTRAVENOUS
  Filled 2019-11-29: qty 1

## 2019-11-29 MED ORDER — POLYETHYLENE GLYCOL 3350 17 G PO PACK: 17.0000 g | PACK | Freq: Every day | ORAL | 3 refills | Status: DC | PRN

## 2019-11-29 MED ORDER — ONDANSETRON 4 MG PO TBDP: 4.0000 mg | ORAL_TABLET | Freq: Three times a day (TID) | ORAL | 0 refills | Status: DC | PRN

## 2019-11-29 MED ORDER — NITROFURANTOIN MACROCRYSTAL 100 MG PO CAPS: 100.0000 mg | ORAL_CAPSULE | Freq: Every day | ORAL | Status: DC

## 2019-11-29 NOTE — Progress Notes (Signed)
D/C instructions given to patient. Patient had no questions. NT or writer wheel assist patient to the front door once family comes in

## 2019-11-29 NOTE — Discharge Summary (Signed)
Physician Discharge Summary   Patient ID: Curtis CharsLeo M Mann MRN: [id number] DOB/AGE: Apr 18, 1986 33 y.o.  Admit date: 11/27/2019 Discharge date: 11/29/2019  Primary Care Physician:  Florentina Jennyripp, Henry, MD   Recommendations for Outpatient Follow-up:  1. Follow up with PCP in 1-2 weeks 2. Continue Vantin 100 mg twice daily for 10 days for pyelonephritis.  Patient recommended to follow-up with urology within 7 to 10 days.  Home Health: None  Equipment/Devices:   Discharge Condition: stable  CODE STATUS: FULL  Diet recommendation:    Discharge Diagnoses:    . Urinary tract infection associated with catheterization of urinary tract, initial encounter (HCC) . Quadriplegia (HCC) . Acute left pyelonephritis Neurogenic bowel with constipation Neurogenic bladder Mild left-sided chest discomfort Hypotension Mild protein calorie malnutrition  Consults: None    Allergies:   Allergies  Allergen Reactions  . Amoxicillin Hives  . Bactrim [Sulfamethoxazole-Trimethoprim] Hives  . Penicillins Hives    Did it involve swelling of the face/tongue/throat, SOB, or low BP? No Did it involve sudden or severe rash/hives, skin peeling, or any reaction on the inside of your mouth or nose? No Did you need to seek medical attention at a hospital or doctor's office? No When did it last happen?1 year If all above answers are "NO", may proceed with cephalosporin use.      DISCHARGE MEDICATIONS: Allergies as of 11/29/2019      Reactions   Amoxicillin Hives   Bactrim [sulfamethoxazole-trimethoprim] Hives   Penicillins Hives   Did it involve swelling of the face/tongue/throat, SOB, or low BP? No Did it involve sudden or severe rash/hives, skin peeling, or any reaction on the inside of your mouth or nose? No Did you need to seek medical attention at a hospital or doctor's office? No When did it last happen?1 year If all above answers are "NO", may proceed with cephalosporin use.       Medication List    TAKE these medications   baclofen 20 MG tablet Commonly known as: LIORESAL Take 20 mg by mouth 3 (three) times daily.   cefpodoxime 100 MG tablet Commonly known as: VANTIN Take 1 tablet (100 mg total) by mouth 2 (two) times daily for 10 days.   docusate sodium 100 MG capsule Commonly known as: COLACE Take 1 capsule (100 mg total) by mouth 2 (two) times daily. Also available OTC   HYDROcodone-acetaminophen 5-325 MG tablet Commonly known as: Norco Take 1-2 tablets by mouth every 6 (six) hours as needed for moderate pain.   loratadine-pseudoephedrine 10-240 MG 24 hr tablet Commonly known as: CLARITIN-D 24-hour Take 1 tablet by mouth daily as needed for allergies.   multivitamin with minerals Tabs tablet Take 1 tablet by mouth daily.   nitrofurantoin 100 MG capsule Commonly known as: MACRODANTIN Take 1 capsule (100 mg total) by mouth daily. HOLD until antibiotic course is completed What changed: additional instructions   ondansetron 4 MG disintegrating tablet Commonly known as: Zofran ODT Take 1 tablet (4 mg total) by mouth every 8 (eight) hours as needed for nausea or vomiting.   oxybutynin 15 MG 24 hr tablet Commonly known as: DITROPAN XL Take 15 mg by mouth daily as needed (self catheter).   polyethylene glycol 17 g packet Commonly known as: MiraLax Take 17 g by mouth daily as needed for moderate constipation.        Brief H and P: For complete details please refer to admission H and P, but in brief Patient is a 33 year old male with history of  C5 quadriplegia with neurogenic bowel and bladder who presents to the ED for evaluation ofconcern for UTI and left chest wall discomfort. Patient alternates in and out catheterization for 2 weeks with Foley catheter placement every 2 weeks. He says he last placed a Foley catheter 11/22/2019. He is also on daily nitrofurantoin for UTI prophylaxis.   Patient reports experiencing bladder discomfort  approximately 3 days ago. He noticed seeing purulent penile discharge. He has been experiencing chills and diaphoresis. He says these are the usual symptoms he gets when he has a UTI. He tried doubling of his nitrofurantoin for his urologist recommendation without significant relief.  He also notes having intermittent left-sided chest pain without radiation and occasional shortness of breath. He has checked his oxygen at home with lowest reading of 96%. He is not taking a bowel regimen and says his last bowel movement was on 11/22/2019 with a substantial amount of stool. He was last admitted in June 2019 for UTI at which time urine cultures grew Proteus vulgaris which was resistant to ampicillin, cefazolin, and nitrofurantoin, otherwise sensitive.He was treated with and tolerated IV ceftriaxone at that time  In ED, BP 90/43, pulse 90, temp 98.1 F, WBCs 8.4, hemoglobin 14.5 D-dimer 0.3.  UA positive for UTI    Hospital Course:    Acute pyelonephritis, acute UTI associated with catheterization in the setting of neurogenic bladder -Patient was placed on IV ceftriaxone, patient also complained about left flank pain likely has pyelonephritis due to complicated UTI -Urine culture showed Serratia, sensitive to IV Rocephin -Blood cultures negative so far -Doing well, no fevers or leukocytosis, will transition to oral Vantin for 10 days.  Patient recommended to hold nitrofurantoin while completing antibiotics for complicated UTI/pyelonephritis.  Recommended to follow-up with urology in next 7 to 10 days.      Hypotension -Likely due to #1, BP has been soft, currently at baseline.  Patient was placed on IV fluids during hospitalization.   mild left-sided chest discomfort -Improved, troponin negative, chest x-ray reassuring, EKG with no acute ST-T wave changes -Possibly musculoskeletal or referred from acute pyelonephritis  Neurogenic bladder with quadriplegia ([place]), neurogenic bowel  with constipation -KUB negative for any obstruction -Patient placed on bowel regimen, Colace 100 mg twice daily, MiraLAX 17 g p.o. daily as needed, takes Metamucil at home.   Protein calorie malnutrition -BMI 18.9, consult dietitian, recommend nutritional supplements  Day of Discharge S: No acute complaints at this time.  No fevers or chills or worsening pain.  BP (!) 95/57 (BP Location: Right Arm)   Pulse 93   Temp 99.3 F (37.4 C) (Oral)   Resp 16   Ht 6' (1.829 m)   Wt 63.5 kg   SpO2 97%   BMI 18.99 kg/m   Physical Exam: General: Alert and awake oriented x3 not in any acute distress. HEENT:  CVS: S1-S2 clear no murmur rubs or gallops Chest: clear to auscultation bilaterally, no wheezing rales or rhonchi Abdomen: soft nontender, nondistended, normal bowel sounds mild left CVAT Extremities: no cyanosis, clubbing or edema noted bilaterally Neuro: C5 paraplegia   The results of significant diagnostics from this hospitalization (including imaging, microbiology, ancillary and laboratory) are listed below for reference.      Procedures/Studies:  Abd 1 View (KUB)  Result Date: 11/27/2019 CLINICAL DATA:  Constipation EXAM: ABDOMEN - 1 VIEW COMPARISON:  CT 05/28/2018 FINDINGS: Nonobstructive bowel gas pattern. No organomegaly or free air. No suspicious calcification. No significant increase in stool burden. No acute bony abnormality.  IMPRESSION: No acute findings. Electronically Signed   By: Charlett Nose M.D.   On: 11/27/2019 19:35   DG Chest Portable 1 View  Result Date: 11/27/2019 CLINICAL DATA:  Chest pain and shortness of breath EXAM: PORTABLE CHEST 1 VIEW COMPARISON:  December 12, 2016 FINDINGS: Lungs are clear. Heart size and pulmonary vascularity are normal. No adenopathy. No pneumothorax. There is postoperative change in the lower cervical region. IMPRESSION: No edema or consolidation.  No adenopathy. Electronically Signed   By: Bretta Bang III M.D.   On:  11/27/2019 15:03       LAB RESULTS: Basic Metabolic Panel: Recent Labs  Lab 11/27/19 1417 11/28/19 0435  NA 141 140  K 3.9 3.8  CL 104 104  CO2 26 24  GLUCOSE 91 84  BUN 13 13  CREATININE 0.51* 0.48*  CALCIUM 9.5 8.7*   Liver Function Tests: Recent Labs  Lab 11/27/19 1417  AST 16  ALT 15  ALKPHOS 45  BILITOT 0.7  PROT 8.1  ALBUMIN 4.5   Recent Labs  Lab 11/27/19 1417  LIPASE 27   No results for input(s): AMMONIA in the last 168 hours. CBC: Recent Labs  Lab 11/27/19 1417 11/28/19 0435  WBC 8.4 7.4  NEUTROABS 5.2  --   HGB 14.5 13.8  HCT 43.9 42.6  MCV 88.0 89.7  PLT 328 270   Cardiac Enzymes: No results for input(s): CKTOTAL, CKMB, CKMBINDEX, TROPONINI in the last 168 hours. BNP: Invalid input(s): POCBNP CBG: No results for input(s): GLUCAP in the last 168 hours.    Disposition and Follow-up: Discharge Instructions    Diet - low sodium heart healthy   Complete by: As directed    Discharge instructions   Complete by: As directed    Please hold the nitrofurantoin while you are completing the antibiotic course for current UTI.  Discussed with urology regarding Foley catheter and prophylactic antibiotic.  Take Colace and MiraLAX as a bowel regimen daily as recommended, Metamucil.   Increase activity slowly   Complete by: As directed        DISPOSITION: home    DISCHARGE FOLLOW-UP Follow-up Information    [facility]. Schedule an appointment as soon as possible for a visit in 10 day(s).   Contact information: [address] [place] [address] [phone]           Time coordinating discharge:    Signed:   Thad Ranger M.D. [facility] 11/29/2019, 12:35 PM

## 2019-12-03 LAB — CULTURE, BLOOD (ROUTINE X 2)
Culture: NO GROWTH
Culture: NO GROWTH
Special Requests: ADEQUATE
Special Requests: ADEQUATE

## 2019-12-12 DIAGNOSIS — N3000 Acute cystitis without hematuria: Secondary | ICD-10-CM | POA: Diagnosis not present

## 2019-12-26 DIAGNOSIS — Z79899 Other long term (current) drug therapy: Secondary | ICD-10-CM | POA: Diagnosis not present

## 2019-12-26 DIAGNOSIS — G8250 Quadriplegia, unspecified: Secondary | ICD-10-CM | POA: Diagnosis not present

## 2020-01-27 DIAGNOSIS — Z79899 Other long term (current) drug therapy: Secondary | ICD-10-CM | POA: Diagnosis not present

## 2020-05-21 DIAGNOSIS — R32 Unspecified urinary incontinence: Secondary | ICD-10-CM | POA: Insufficient documentation

## 2020-05-21 DIAGNOSIS — K592 Neurogenic bowel, not elsewhere classified: Secondary | ICD-10-CM | POA: Insufficient documentation

## 2020-05-21 DIAGNOSIS — Z993 Dependence on wheelchair: Secondary | ICD-10-CM | POA: Diagnosis not present

## 2020-05-21 DIAGNOSIS — K635 Polyp of colon: Secondary | ICD-10-CM | POA: Insufficient documentation

## 2020-05-21 DIAGNOSIS — N319 Neuromuscular dysfunction of bladder, unspecified: Secondary | ICD-10-CM | POA: Diagnosis not present

## 2020-05-21 DIAGNOSIS — G894 Chronic pain syndrome: Secondary | ICD-10-CM | POA: Insufficient documentation

## 2020-05-21 DIAGNOSIS — E441 Mild protein-calorie malnutrition: Secondary | ICD-10-CM | POA: Insufficient documentation

## 2020-05-21 DIAGNOSIS — G8250 Quadriplegia, unspecified: Secondary | ICD-10-CM | POA: Diagnosis not present

## 2020-05-21 DIAGNOSIS — W130XXS Fall from, out of or through balcony, sequela: Secondary | ICD-10-CM | POA: Insufficient documentation

## 2020-06-11 DIAGNOSIS — N37 Urethral disorders in diseases classified elsewhere: Secondary | ICD-10-CM | POA: Insufficient documentation

## 2020-06-23 DIAGNOSIS — N390 Urinary tract infection, site not specified: Secondary | ICD-10-CM | POA: Diagnosis not present

## 2020-06-23 DIAGNOSIS — R319 Hematuria, unspecified: Secondary | ICD-10-CM | POA: Diagnosis not present

## 2020-06-23 DIAGNOSIS — G8250 Quadriplegia, unspecified: Secondary | ICD-10-CM | POA: Diagnosis not present

## 2020-06-23 DIAGNOSIS — N319 Neuromuscular dysfunction of bladder, unspecified: Secondary | ICD-10-CM | POA: Diagnosis not present

## 2020-08-27 DIAGNOSIS — R399 Unspecified symptoms and signs involving the genitourinary system: Secondary | ICD-10-CM | POA: Diagnosis not present

## 2020-09-28 DIAGNOSIS — Z20822 Contact with and (suspected) exposure to covid-19: Secondary | ICD-10-CM | POA: Diagnosis not present

## 2020-10-09 DIAGNOSIS — N318 Other neuromuscular dysfunction of bladder: Secondary | ICD-10-CM | POA: Diagnosis not present

## 2020-10-09 DIAGNOSIS — N393 Stress incontinence (female) (male): Secondary | ICD-10-CM | POA: Diagnosis not present

## 2020-10-11 ENCOUNTER — Other Ambulatory Visit: Payer: Self-pay

## 2020-10-11 ENCOUNTER — Encounter (HOSPITAL_COMMUNITY): Payer: Self-pay

## 2020-10-11 ENCOUNTER — Emergency Department (HOSPITAL_COMMUNITY): Payer: Medicare Other

## 2020-10-11 ENCOUNTER — Emergency Department (HOSPITAL_COMMUNITY)
Admission: EM | Admit: 2020-10-11 | Discharge: 2020-10-11 | Disposition: A | Discharge status: Home / Self Care | Payer: Medicare Other | Attending: Emergency Medicine | Admitting: Emergency Medicine

## 2020-10-11 DIAGNOSIS — R319 Hematuria, unspecified: Secondary | ICD-10-CM

## 2020-10-11 DIAGNOSIS — Z87891 Personal history of nicotine dependence: Secondary | ICD-10-CM | POA: Diagnosis not present

## 2020-10-11 DIAGNOSIS — R109 Unspecified abdominal pain: Secondary | ICD-10-CM | POA: Diagnosis not present

## 2020-10-11 LAB — COMPREHENSIVE METABOLIC PANEL
ALT: 12 U/L (ref 0–44)
AST: 15 U/L (ref 15–41)
Albumin: 4.2 g/dL (ref 3.5–5.0)
Alkaline Phosphatase: 41 U/L (ref 38–126)
Anion gap: 9 (ref 5–15)
BUN: 12 mg/dL (ref 6–20)
CO2: 24 mmol/L (ref 22–32)
Calcium: 8.8 mg/dL — ABNORMAL LOW (ref 8.9–10.3)
Chloride: 105 mmol/L (ref 98–111)
Creatinine, Ser: 0.56 mg/dL — ABNORMAL LOW (ref 0.61–1.24)
GFR, Estimated: >60 mL/min (ref >60)
Glucose, Bld: 93 mg/dL (ref 70–99)
Potassium: 3.9 mmol/L (ref 3.5–5.1)
Sodium: 138 mmol/L (ref 135–145)
Total Bilirubin: 0.6 mg/dL (ref 0.3–1.2)
Total Protein: 7.2 g/dL (ref 6.5–8.1)

## 2020-10-11 LAB — CBC WITH DIFFERENTIAL/PLATELET
Abs Immature Granulocytes: 0.03 10*3/uL (ref 0.00–0.07)
Basophils Absolute: 0 10*3/uL (ref 0.0–0.1)
Basophils Relative: 0 %
Eosinophils Absolute: 0.2 10*3/uL (ref 0.0–0.5)
Eosinophils Relative: 2 %
HCT: 40.6 % (ref 39.0–52.0)
Hemoglobin: 13.9 g/dL (ref 13.0–17.0)
Immature Granulocytes: 0 %
Lymphocytes Relative: 16 %
Lymphs Abs: 1.5 10*3/uL (ref 0.7–4.0)
MCH: 29.6 pg (ref 26.0–34.0)
MCHC: 34.2 g/dL (ref 30.0–36.0)
MCV: 86.4 fL (ref 80.0–100.0)
Monocytes Absolute: 1 10*3/uL (ref 0.1–1.0)
Monocytes Relative: 11 %
Neutro Abs: 6.9 10*3/uL (ref 1.7–7.7)
Neutrophils Relative %: 71 %
Platelets: 249 10*3/uL (ref 150–400)
RBC: 4.7 MIL/uL (ref 4.22–5.81)
RDW: 12.9 % (ref 11.5–15.5)
WBC: 9.7 10*3/uL (ref 4.0–10.5)
nRBC: 0 % (ref 0.0–0.2)

## 2020-10-11 LAB — URINALYSIS, ROUTINE W REFLEX MICROSCOPIC
Glucose, UA: NEGATIVE mg/dL
Ketones, ur: NEGATIVE mg/dL
Nitrite: NEGATIVE
Protein, ur: 100 mg/dL — AB
Specific Gravity, Urine: 1.025 (ref 1.005–1.030)
pH: 6.5 (ref 5.0–8.0)

## 2020-10-11 LAB — URINALYSIS, MICROSCOPIC (REFLEX)

## 2020-10-11 LAB — LIPASE, BLOOD: Lipase: 25 U/L (ref 11–51)

## 2020-10-11 MED ORDER — CIPROFLOXACIN HCL 500 MG PO TABS: 500.0000 mg | ORAL_TABLET | Freq: Two times a day (BID) | ORAL | 0 refills | Status: AC

## 2020-10-11 MED ORDER — SODIUM CHLORIDE 0.9 % IV SOLN: Freq: Once | INTRAVENOUS | Status: AC

## 2020-10-11 NOTE — Discharge Instructions (Addendum)
Please take antibiotics as prescribed and follow-up closely with your urologist.  He did have bloody urine which may be consistent with urinary tract infection or be from repetitive catheterization.  I have prescribed you some antibiotics which you may take however, as we discussed, you can basis somewhat of your symptoms.  I recommend drinking plenty of water.  Staying hydrated as this will help increase your urine output.

## 2020-10-11 NOTE — ED Provider Notes (Signed)
[facility]-EMERGENCY DEPT Provider Note   CSN: [id number] Arrival date & time: 10/11/20  0750     History Chief Complaint  Patient presents with  . Hematuria    Noralee CharsLeo M Rossi is a 34 y.o. male.  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  He was seen by urology on Friday and was told to follow-up in 6 weeks.  He did not have a urine sample obtained during that visit.  He states that he has had some left flank pain that is mild but states that he also has decreased sensation below his chest. He denies any fevers, nausea, vomiting, changes in bowel movements/diarrhea, denies any melena or hematochezia.  He states that this morning he put a Foley catheter and after noticing some hematuria after in and out cathing himself.  He states that the hematuria was most notable at the end of his stream.  He denies any penile pain states that he is married and has not had any sexual activity in the past year.  No penile discharge or penile pain or testicular pain.  No other associated symptoms.  No aggravating or mitigating factors.     Past Medical History:  Diagnosis Date  . Complication of anesthesia    in 2015 patient states he woke up immediately after surgery , 2016- took him 30 minutes to wake up   . Hypotension occasional  . Neurogenic bladder    foley and i and o caths  . Neurogenic bladder    i and o caths 6-8 times day  . Neurogenic bowel    bowel program with digital stimulation as needed  . Neuromuscular disorder (HCC)    Quadriplegic uses electric wheelchair, Nurse, adultHoyer Lift or Two person Transfer  . Quadriplegic spinal paralysis [facility](HCC) 2009    Patient Active Problem List   Diagnosis Date Noted  . Acute  pyelonephritis 11/28/2019  . Pyelonephritis 11/28/2019  . Urinary tract infection associated with catheterization of urinary tract, initial encounter (HCC) 11/27/2019  . Neurogenic bladder   . Hypotension   . UTI (urinary tract infection) 05/28/2018  . Leukocytosis 12/13/2016  . Community acquired pneumonia of right lower lobe of lung   . Quadriplegia (HCC)   . 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 CAP (community acquired pneumonia) 12/12/2016    Past Surgical History:  Procedure Laterality Date  . bicep and tricept tendon transfers bilateral  2011  . bladder botox surgery  2012  . BOTOX INJECTION N/A 03/06/2018   Procedure: BOTOX INJECTION CYSTOSCOPY WITH FULGURATION;  Surgeon: Alfredo MartinezMacDiarmid, Scott, MD;  Location: [facility];  Service: Urology;  Laterality: N/A;  . BOTOX INJECTION N/A 05/14/2019   Procedure: CYSTOSCOPY BOTOX INJECTION;  Surgeon: Alfredo MartinezMacDiarmid, Scott, MD;  Location: [facility];  Service: Urology;  Laterality: N/A;  . CYSTOSCOPY N/A 01/20/2014   Procedure: CYSTOSCOPY WITH BOTOX INJECTION;  Surgeon: Kathi LudwigSigmund I Tannenbaum, MD;  Location: [facility];  Service: Urology;  Laterality: N/A;  . CYSTOSCOPY N/A 04/09/2015   Procedure: CYSTOSCOPY WITH BOTOX INJECTION;  Surgeon: Jethro BolusSigmund Tannenbaum, MD;  Location: [facility];  Service: Urology;  Laterality: N/A;  . CYSTOSCOPY WITH INJECTION  10/11/2012   Procedure: CYSTOSCOPY WITH INJECTION;  Surgeon: Kathi LudwigSigmund I Tannenbaum, MD;  Location: [facility];  Service: Urology;  Laterality: N/A;  botox  . CYSTOSCOPY WITH INJECTION N/A 04/29/2016   Procedure: CYSTOSCOPY WITH INJECTION;  Surgeon: Jethro Bolus, MD;  Location: [facility];  Service: Urology;  Laterality: N/A;  . 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 VASECTOMY Bilateral 05/14/2019   Procedure: VASECTOMY;  Surgeon: Alfredo Martinez, MD;  Location: [facility];  Service: Urology;  Laterality: Bilateral;       Family History  Problem Relation Age of Onset  . Colon cancer Mother   . Pulmonary  embolism Mother   . Hypertension Father   . Congestive Heart Failure Paternal Grandfather     Social History   Tobacco Use  . Smoking status: Former Smoker    Packs/day: 0.25    Years: 4.00    Pack years: 1.00    Types: Cigarettes    Quit date: 12/13/2007    Years since quitting: 12.8  . Smokeless tobacco: Never Used  Vaping Use  . Vaping Use: Never used  Substance Use Topics  . Alcohol use: No  . Drug use: No    Home Medications Prior to Admission medications   Medication Sig Start Date End Date Taking? Authorizing Provider  baclofen (LIORESAL) 20 MG tablet Take 20 mg by mouth 3 (three) times daily.    [provider]  ciprofloxacin (CIPRO) 500 MG tablet Take 1 tablet (500 mg total) by mouth every 12 (twelve) hours for 10 days. 10/11/20 10/21/20  Gailen Shelter, PA  docusate sodium (COLACE) 100 MG capsule Take 1 capsule (100 mg total) by mouth 2 (two) times daily. Also available OTC 11/29/19   Rai, Delene Ruffini, MD  HYDROcodone-acetaminophen (NORCO) 5-325 MG tablet Take 1-2 tablets by mouth every 6 (six) hours as needed for moderate pain. 05/14/19   MacDiarmid, Lorin Picket, MD  loratadine-pseudoephedrine (CLARITIN-D 24-HOUR) 10-240 MG 24 hr tablet Take 1 tablet by mouth daily as needed for allergies.    [provider]  Multiple Vitamin (MULTIVITAMIN WITH MINERALS) TABS tablet Take 1 tablet by mouth daily.    [provider]  nitrofurantoin (MACRODANTIN) 100 MG capsule Take 1 capsule (100 mg total) by mouth daily. HOLD until antibiotic course is completed 11/29/19   Rai, Delene Ruffini, MD  ondansetron (ZOFRAN ODT) 4 MG disintegrating tablet Take 1 tablet (4 mg total) by mouth every 8 (eight) hours as needed for nausea or vomiting. 11/29/19   Rai, Ripudeep K, MD  oxybutynin (DITROPAN XL) 15 MG 24 hr tablet Take 15 mg by mouth daily as needed (self catheter).     [provider]  polyethylene glycol (MIRALAX) 17 g packet Take 17 g by mouth daily as needed for  moderate constipation. 11/29/19   Rai, Delene Ruffini, MD    Allergies    Amoxicillin, Bactrim [sulfamethoxazole-trimethoprim], and Penicillins  Review of Systems   Review of Systems  Constitutional: Negative for chills and fever.  HENT: Negative for congestion.   Respiratory: Negative for shortness of breath.   Cardiovascular: Negative for chest pain.  Gastrointestinal: Negative for abdominal pain.  Genitourinary: Positive for decreased urine volume, flank pain and hematuria. Negative for penile pain, penile swelling and urgency.  Musculoskeletal: Negative for neck pain.    Physical Exam Updated Vital Signs BP 114/68 (BP Location: Left Arm)   Pulse 76   Temp 98.1 F (36.7 C) (Oral)   Resp 16   Ht 6' (1.829 m)   Wt 59 kg   SpO2 97%   BMI 17.63 kg/m  Physical Exam Vitals and nursing note reviewed.  Constitutional:      General: He is not in acute distress.    Comments: Pleasant, well-appearing 34 year old male in no acute distress sitting comfortably in bed.  Able answer questions appropriately follow commands.  HENT:     Head: Normocephalic and atraumatic.     Nose: Nose normal.     Mouth/Throat:     Mouth: Mucous membranes are moist.  Eyes:     General: No scleral icterus.    Extraocular Movements: Extraocular movements intact.     Pupils: Pupils are equal, round, and reactive to light.  Cardiovascular:     Rate and Rhythm: Normal rate and regular rhythm.     Pulses: Normal pulses.     Heart sounds: Normal heart sounds.  Pulmonary:     Effort: Pulmonary effort is normal. No respiratory distress.     Breath sounds: No wheezing.  Abdominal:     Palpations: Abdomen is soft.     Tenderness: There is no abdominal tenderness. There is no right CVA tenderness, left CVA tenderness, guarding or rebound.     Comments: No abdominal TTP  Genitourinary:    Comments: Foley catheter in place.  No penile urethra erythema or tenderness.  No discharge present. Musculoskeletal:      Cervical back: Normal range of motion.     Right lower leg: No edema.     Left lower leg: No edema.  Skin:    General: Skin is warm and dry.     Capillary Refill: Capillary refill takes less than 2 seconds.  Neurological:     Mental Status: He is alert. Mental status is at baseline.  Psychiatric:        Mood and Affect: Mood normal.        Behavior: Behavior normal.     ED Results / Procedures / Treatments   Labs (all labs ordered are listed, but only abnormal results are displayed) Labs Reviewed  URINALYSIS, ROUTINE W REFLEX MICROSCOPIC - Abnormal; Notable for the following components:      Result Value   APPearance CLOUDY (*)    Hgb urine dipstick LARGE (*)    Bilirubin Urine SMALL (*)    Protein, ur 100 (*)    Leukocytes,Ua MODERATE (*)    All other components within normal limits  COMPREHENSIVE METABOLIC PANEL - Abnormal; Notable for the following components:   Creatinine, Ser 0.56 (*)    Calcium 8.8 (*)    All other components within normal limits  URINALYSIS, MICROSCOPIC (REFLEX) - Abnormal; Notable for the following components:   Bacteria, UA FEW (*)    Non Squamous Epithelial PRESENT (*)    All other components within normal limits  URINE CULTURE  CBC WITH DIFFERENTIAL/PLATELET  LIPASE, BLOOD    EKG None  Radiology CT Renal Stone Study  Result Date: 10/11/2020 CLINICAL DATA:  Mid blood in Foley catheter tubing this morning. Treated for a UTI a month ago. Decreased urine output. Left-sided abdominal pain. EXAM: CT ABDOMEN AND PELVIS WITHOUT CONTRAST TECHNIQUE: Multidetector CT imaging of the abdomen and pelvis was performed following the standard protocol without IV contrast. COMPARISON:  05/28/2018 FINDINGS: Lower chest: Clear lung bases. Hepatobiliary: No focal liver abnormality is seen. No gallstones, gallbladder wall thickening, or biliary dilatation. Pancreas: Unremarkable. No pancreatic ductal dilatation or surrounding inflammatory changes. Spleen: Normal  in size without focal abnormality. Adrenals/Urinary Tract: No adrenal masses. Kidneys normal in size, orientation and position. No renal masses, stones or  hydronephrosis. Normal ureters. Bladder is decompressed around a Foley catheter. No convincing bladder mass or stone. Stomach/Bowel: Stomach is within normal limits. Appendix appears normal. No evidence of bowel wall thickening, distention, or inflammatory changes. Vascular/Lymphatic: No significant vascular findings are present. No enlarged abdominal or pelvic lymph nodes. Reproductive: Unremarkable. Other: No abdominal wall hernia or abnormality. No abdominopelvic ascites. Musculoskeletal: No fracture or acute finding.  No bone lesion. IMPRESSION: 1. No acute findings. No renal or ureteral stones or obstructive uropathy. 2. Bladder decompressed around a Foley catheter, suboptimally evaluated. No convincing mass or stone. 3. Remainder of exam is within normal limits. Electronically Signed   By: Amie Portland M.D.   On: 10/11/2020 09:59    Procedures Procedures (including critical care time)  Medications Ordered in ED Medications  0.9 %  sodium chloride infusion ( Intravenous Stopped 10/11/20 1112)    ED Course  I have reviewed the triage vital signs and the nursing notes.  Pertinent labs & imaging results that were available during my care of the patient were reviewed by me and considered in my medical decision making (see chart for details).  Patient is well-appearing 34 year old generally with past medical history detailed below presented today with hematuria and some flank pain.  Physical exam notable history no CVA tenderness.  He does have hematuria.  Some concern for kidney stone given hematuria and flank pain.  Low suspicion for intra-abdominal infection pathology given his examination.  He does have  CBC without leukocytosis or anemia. CMP with no elevation in creatinine or BUN.  Electrolytes within normal months. Lipase within normal  limits hepatitis. Urinalysis with questionable evidence of infection given hematuria, leukocytes, bacteria. Urine culture pending.   Clinical Course as of Oct 12 1631  Wynelle Link Oct 11, 2020  1002 Renal stone study negative for stone. No evidence of hydronephrosis. Notably the appendix was visualized and appears normal.  1. No acute findings. No renal or ureteral stones or obstructive uropathy. 2. Bladder decompressed around a Foley catheter, suboptimally evaluated. No convincing mass or stone. 3. Remainder of exam is within normal limits.   [WF]    Clinical Course User Index [WF] Gailen Shelter, [place]   MDM Rules/Calculators/A&P                          Patient will follow up with urology.  Patient discharged with ciprofloxacin.  He does have allergic reaction to amoxicillin, penicillin, Bactrim. He is understanding of plan and return precautions.  Final Clinical Impression(s) / ED Diagnoses Final diagnoses:  Hematuria, unspecified type    Rx / DC Orders ED Discharge Orders         Ordered    ciprofloxacin (CIPRO) 500 MG tablet  Every 12 hours        10/11/20 1134           Solon Augusta [place], [place] 10/11/20 1642    Sabas Sous, MD 10/12/20 1645

## 2020-10-11 NOTE — ED Triage Notes (Signed)
Patient reports that he noted blood in his foley tubing this AM. Patient states he was treated for a UTI a month ago. Patient states approx 10 days ago he noted that his output had decreased. Patient states that he was self cathing, but decided to leave the foley in yesterday. Patient denies any fever.

## 2020-10-11 NOTE — ED Notes (Signed)
With provider approval, patient took his morning home meds: Baclofen 20mg , Hydrocodone 5/325mg .

## 2020-10-13 LAB — URINE CULTURE: Culture: >=100000 — AB

## 2020-10-14 ENCOUNTER — Telehealth: Payer: Self-pay

## 2020-10-14 NOTE — Telephone Encounter (Signed)
Post ED Visit - Positive Culture Follow-up  Culture report reviewed by antimicrobial stewardship pharmacist: [facility] [x]  , Pharm.D. []  Enzo Bi, Pharm.D., BCPS AQ-ID []  , Pharm.D., BCPS []  Celedonio Miyamoto, Pharm.D., BCPS []  [place], Garvin Fila.D., BCPS, AAHIVP []  , Pharm.D., BCPS, AAHIVP []  [place]ina Pillion, PharmD, BCPS []  , PharmD, BCPS []  [place], PharmD, BCPS []  [address], PharmD []  , PharmD, BCPS []  Estella Husk, PharmD  Pharmacy Team []  Lysle Pearl, PharmD []  , PharmD []  Phillips Climes, PharmD []  , Rph []  Agapito Games) , PharmD []  Verlan Friends, PharmD []  , PharmD []  Mervyn Gay, PharmD []  , PharmD []  Vinnie Level, PharmD []  [facility], PharmD []  , PharmD []  Len Childs, PharmD   Positive urine culture Treated with Ciprofloxacin, organism sensitive to the same and no further patient follow-up is required at this time.  10/14/2020, 3:40 PM

## 2020-11-06 DIAGNOSIS — G8250 Quadriplegia, unspecified: Secondary | ICD-10-CM | POA: Diagnosis not present

## 2020-11-06 DIAGNOSIS — N319 Neuromuscular dysfunction of bladder, unspecified: Secondary | ICD-10-CM | POA: Diagnosis not present

## 2020-11-06 DIAGNOSIS — K592 Neurogenic bowel, not elsewhere classified: Secondary | ICD-10-CM | POA: Diagnosis not present

## 2020-11-06 DIAGNOSIS — Z23 Encounter for immunization: Secondary | ICD-10-CM | POA: Diagnosis not present

## 2020-11-06 DIAGNOSIS — G894 Chronic pain syndrome: Secondary | ICD-10-CM | POA: Diagnosis not present

## 2020-11-26 ENCOUNTER — Other Ambulatory Visit: Payer: Medicare Other

## 2020-11-26 DIAGNOSIS — Z20822 Contact with and (suspected) exposure to covid-19: Secondary | ICD-10-CM

## 2020-11-27 LAB — SARS-COV-2, NAA 2 DAY TAT

## 2020-11-27 LAB — NOVEL CORONAVIRUS, NAA: SARS-CoV-2, NAA: NOT DETECTED

## 2020-12-08 DIAGNOSIS — Z23 Encounter for immunization: Secondary | ICD-10-CM | POA: Diagnosis not present

## 2020-12-17 ENCOUNTER — Other Ambulatory Visit: Payer: Medicare Other

## 2021-02-03 ENCOUNTER — Other Ambulatory Visit: Payer: Self-pay | Admitting: Gastroenterology

## 2021-02-04 ENCOUNTER — Ambulatory Visit (INDEPENDENT_AMBULATORY_CARE_PROVIDER_SITE_OTHER): Payer: Medicare Other

## 2021-02-04 ENCOUNTER — Ambulatory Visit (INDEPENDENT_AMBULATORY_CARE_PROVIDER_SITE_OTHER): Payer: Medicare Other | Admitting: Podiatry

## 2021-02-04 ENCOUNTER — Other Ambulatory Visit: Payer: Self-pay

## 2021-02-04 DIAGNOSIS — R1032 Left lower quadrant pain: Secondary | ICD-10-CM | POA: Insufficient documentation

## 2021-02-04 DIAGNOSIS — S9032XA Contusion of left foot, initial encounter: Secondary | ICD-10-CM

## 2021-02-04 DIAGNOSIS — K649 Unspecified hemorrhoids: Secondary | ICD-10-CM | POA: Insufficient documentation

## 2021-02-04 DIAGNOSIS — K6289 Other specified diseases of anus and rectum: Secondary | ICD-10-CM | POA: Insufficient documentation

## 2021-02-04 DIAGNOSIS — K921 Melena: Secondary | ICD-10-CM | POA: Insufficient documentation

## 2021-02-04 DIAGNOSIS — B351 Tinea unguium: Secondary | ICD-10-CM

## 2021-02-04 DIAGNOSIS — K625 Hemorrhage of anus and rectum: Secondary | ICD-10-CM | POA: Insufficient documentation

## 2021-02-04 NOTE — Patient Instructions (Signed)
Terbinafine oral granules What is this medicine? TERBINAFINE (TER bin a feen) is an antifungal medicine. It is used to treat certain kinds of fungal or yeast infections. This medicine may be used for other purposes; ask your health care provider or pharmacist if you have questions. COMMON BRAND NAME(S): Lamisil What should I tell my health care provider before I take this medicine? They need to know if you have any of these conditions:  drink alcoholic beverages  kidney disease  liver disease  an unusual or allergic reaction to Terbinafine, other medicines, foods, dyes, or preservatives  pregnant or trying to get pregnant  breast-feeding How should I use this medicine? Take this medicine by mouth. Follow the directions on the prescription label. Hold packet with cut line on top. Shake packet gently to settle contents. Tear packet open along cut line, or use scissors to cut across line. Carefully pour the entire contents of packet onto a spoonful of a soft food, such as pudding or other soft, non-acidic food such as mashed potatoes (do NOT use applesauce or a fruit-based food). If two packets are required for each dose, you may either sprinkle the content of both packets on one spoonful of non-acidic food, or sprinkle the contents of both packets on two spoonfuls of non-acidic food. Make sure that no granules remain in the packet. Swallow the mxiture of the food and granules without chewing. Take your medicine at regular intervals. Do not take it more often than directed. Take all of your medicine as directed even if you think you are better. Do not skip doses or stop your medicine early. Contact your pediatrician or health care professional regarding the use of this medicine in children. While this medicine may be prescribed for children as young as 4 years for selected conditions, precautions do apply. Overdosage: If you think you have taken too much of this medicine contact a poison control  center or emergency room at once. NOTE: This medicine is only for you. Do not share this medicine with others. What if I miss a dose? If you miss a dose, take it as soon as you can. If it is almost time for your next dose, take only that dose. Do not take double or extra doses. What may interact with this medicine? Do not take this medicine with any of the following medications:  thioridazine This medicine may also interact with the following medications:  beta-blockers  caffeine  cimetidine  cyclosporine  MAOIs like Carbex, Eldepryl, Marplan, Nardil, and Parnate  medicines for fungal infections like fluconazole and ketoconazole  medicines for irregular heartbeat like amiodarone, flecainide and propafenone  rifampin  SSRIs like citalopram, escitalopram, fluoxetine, fluvoxamine, paroxetine and sertraline  tricyclic antidepressants like amitriptyline, clomipramine, desipramine, imipramine, nortriptyline, and others  warfarin This list may not describe all possible interactions. Give your health care provider a list of all the medicines, herbs, non-prescription drugs, or dietary supplements you use. Also tell them if you smoke, drink alcohol, or use illegal drugs. Some items may interact with your medicine. What should I watch for while using this medicine? Your doctor may monitor your liver function. Tell your doctor right away if you have nausea or vomiting, loss of appetite, stomach pain on your right upper side, yellow skin, dark urine, light stools, or are over tired. This medicine may cause serious skin reactions. They can happen weeks to months after starting the medicine. Contact your health care provider right away if you notice fevers or flu-like symptoms   with a rash. The rash may be red or purple and then turn into blisters or peeling of the skin. Or, you might notice a red rash with swelling of the face, lips or lymph nodes in your neck or under your arms. You need to take  this medicine for 6 weeks or longer to cure the fungal infection. Take your medicine regularly for as long as your doctor or health care provider tells you to. What side effects may I notice from receiving this medicine? Side effects that you should report to your doctor or health care professional as soon as possible:  allergic reactions like skin rash or hives, swelling of the face, lips, or tongue  change in vision  dark urine  fever or infection  general ill feeling or flu-like symptoms  light-colored stools  loss of appetite, nausea  rash, fever, and swollen lymph nodes  redness, blistering, peeling or loosening of the skin, including inside the mouth  right upper belly pain  unusually weak or tired  yellowing of the eyes or skin Side effects that usually do not require medical attention (report to your doctor or health care professional if they continue or are bothersome):  changes in taste  diarrhea  hair loss  muscle or joint pain  stomach upset This list may not describe all possible side effects. Call your doctor for medical advice about side effects. You may report side effects to FDA at 1-800-FDA-1088. Where should I keep my medicine? Keep out of the reach of children. Store at room temperature between 15 and 30 degrees C (59 and 86 degrees F). Throw away any unused medicine after the expiration date. NOTE: This sheet is a summary. It may not cover all possible information. If you have questions about this medicine, talk to your doctor, pharmacist, or health care provider.  2021 Elsevier/Gold Standard (2019-03-08 15:35:11)  

## 2021-02-08 NOTE — Progress Notes (Signed)
Subjective:   Patient ID: Curtis Mann, male   DOB: 35 y.o.   MRN: [id number]   HPI 35 year old male presents the office today for concerns of bruising to his left big toe. He does not recall any injury.  Only thing he can think of is that he may have hit it while getting in the wheelchair.  He said no recent treatment.  Also his nails are thickened discolored and ask about different treatment options.  No open lesions he reports.  No other concerns.  Review of Systems  All other systems reviewed and are negative.  Past Medical History:  Diagnosis Date  . Complication of anesthesia    in 2015 patient states he woke up immediately after surgery , 2016- took him 30 minutes to wake up   . Hypotension occasional  . Neurogenic bladder    foley and i and o caths  . Neurogenic bladder    i and o caths 6-8 times day  . Neurogenic bowel    bowel program with digital stimulation as needed  . Neuromuscular disorder ([place])    Quadriplegic uses electric wheelchair, [employer] fast streamer or Two person Transfer  . 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 bicep and tricept tendon transfers bilateral  2011  . bladder botox surgery  2012  . BOTOX INJECTION N/A 03/06/2018   Procedure: BOTOX INJECTION CYSTOSCOPY WITH FULGURATION;  Surgeon: Bjorn Loser, MD;  Location: [facility];  Service: Urology;  Laterality: N/A;  . BOTOX INJECTION N/A 05/14/2019   Procedure: CYSTOSCOPY BOTOX INJECTION;  Surgeon: Bjorn Loser, MD;  Location: [facility];  Service: Urology;  Laterality: N/A;  . CYSTOSCOPY N/A 01/20/2014   Procedure: CYSTOSCOPY WITH BOTOX INJECTION;  Surgeon: Ailene Rud, MD;  Location: [facility];  Service: Urology;  Laterality: N/A;  . CYSTOSCOPY N/A 04/09/2015   Procedure: CYSTOSCOPY WITH BOTOX INJECTION;  Surgeon: Carolan Clines, MD;  [facility];  Service: Urology;  Laterality: N/A;  . CYSTOSCOPY WITH INJECTION  10/11/2012   Procedure:  CYSTOSCOPY WITH INJECTION;  Surgeon: Ailene Rud, MD;  [facility]: [facility];  Service: Urology;  Laterality: N/A;  botox  . CYSTOSCOPY WITH INJECTION N/A 04/29/2016   Procedure: CYSTOSCOPY WITH INJECTION;  Surgeon: Carolan Clines, MD;  [facility];  Service: Urology;  Laterality: N/A;  . 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 VASECTOMY Bilateral 05/14/2019   Procedure: VASECTOMY;  Surgeon: Bjorn Loser, MD;  [facility];  Service: Urology;  Laterality: Bilateral;     Current Outpatient Medications:  .  Lidocaine-Hydrocortisone Ace (ANA-LEX) 2-2 % KIT, 1, Disp: , Rfl:  .  mirabegron ER (MYRBETRIQ) 50 MG TB24 tablet, Take 1 tablet by mouth daily., Disp: , Rfl:  .  baclofen (LIORESAL) 20 MG tablet, Take 20 mg by mouth 3 (three) times daily., Disp: , Rfl:  .  calcium carbonate (SUPER CALCIUM) 1500 (600 Ca) MG TABS tablet, 1 tablet with meals, Disp: , Rfl:  .  docusate sodium (COLACE) 100 MG capsule, Take 1 capsule (100 mg total) by mouth 2 (two) times daily. Also available OTC, Disp: 60 capsule, Rfl: 4 .  HYDROcodone-acetaminophen (NORCO) 5-325 MG tablet, Take 1-2 tablets by mouth every 6 (six) hours as needed for moderate pain., Disp: 16 tablet, Rfl: 0 .  loratadine-pseudoephedrine (CLARITIN-D 24-HOUR) 10-240 MG 24 hr tablet, Take 1 tablet by mouth daily as needed for allergies., Disp: , Rfl:  .  Multiple Vitamin (MULTIVITAMIN WITH MINERALS) TABS tablet, Take 1 tablet by mouth daily., Disp: , Rfl:  .  nitrofurantoin (MACRODANTIN) 100 MG capsule, Take 1 capsule (100 mg total) by mouth daily. HOLD until antibiotic course is completed, Disp:  , Rfl:  .  ondansetron (ZOFRAN ODT) 4 MG disintegrating tablet, Take 1 tablet (4 mg total) by mouth every 8 (eight) hours as needed for nausea or vomiting., Disp: 20 tablet, Rfl: 0 .  oxybutynin (DITROPAN XL) 15 MG 24 hr tablet, Take 15 mg by mouth daily as needed (self catheter). , Disp: , Rfl:  .  polyethylene glycol (MIRALAX)  17 g packet, Take 17 g by mouth daily as needed for moderate constipation., Disp: 30 each, Rfl: 3  Allergies  Allergen Reactions  . Amoxicillin Hives  . Bactrim [Sulfamethoxazole-Trimethoprim] Hives  . Penicillins Hives    Did it involve swelling of the face/tongue/throat, SOB, or low BP? No Did it involve sudden or severe rash/hives, skin peeling, or any reaction on the inside of your mouth or nose? No Did you need to seek medical attention at a hospital or doctor's office? No When did it last happen?1 year If all above answers are "NO", may proceed with cephalosporin use.   . Sulfa Antibiotics     Other reaction(s): ??         Objective:  Physical Exam  General: AAO x3, NAD  Dermatological: Nails are hypertrophic, dystrophic, brittle, yellow discoloration, elongated 10.  Bruising present on the left hallux as well as medial first MPJ without any open lesions.   Vascular: Dorsalis Pedis artery and Posterior Tibial artery pedal pulses are 2/4 bilateral with immedate capillary fill time. There is no pain with calf compression, swelling, warmth, erythema.   Neruologic: Quadriplegic  Musculoskeletal: He does have some sensation he has mild discomfort on the area bruising.  In the area discomfort.  Gait: In wheelchair      Assessment:   Contusion left foot; onychomycosis    Plan:  -Treatment options discussed including all alternatives, risks, and complications -Etiology of symptoms were discussed -Regards to the condition x-rays were obtained and reviewed.  Only able to obtain AP view.  No evidence of acute fracture.  Sclerotic line noted the proximal phalanx but this is likely chronic.  I do think it is more from a contusion.  He does stand in the wheelchair and I discussed with him to hold off on that for now until this resolves.  It does not resolve next couple weeks to let me know or sooner if there is any worsening. -For the nail fungus I did debride the nails as  a courtesy with any complications or bleeding.  Discussed different treatment of the nail fungus.  Discussed with his wife topical versus oral Lamisil.  Trula Slade DPM

## 2021-02-09 ENCOUNTER — Encounter (HOSPITAL_COMMUNITY): Payer: Self-pay

## 2021-02-09 ENCOUNTER — Emergency Department (HOSPITAL_COMMUNITY): Payer: Medicare Other

## 2021-02-09 ENCOUNTER — Other Ambulatory Visit: Payer: Self-pay

## 2021-02-09 ENCOUNTER — Emergency Department (HOSPITAL_COMMUNITY)
Admission: EM | Admit: 2021-02-09 | Discharge: 2021-02-09 | Disposition: A | Discharge status: Home / Self Care | Payer: Medicare Other | Attending: Emergency Medicine | Admitting: Emergency Medicine

## 2021-02-09 DIAGNOSIS — R109 Unspecified abdominal pain: Secondary | ICD-10-CM | POA: Diagnosis present

## 2021-02-09 DIAGNOSIS — Z87891 Personal history of nicotine dependence: Secondary | ICD-10-CM | POA: Insufficient documentation

## 2021-02-09 LAB — CBC WITH DIFFERENTIAL/PLATELET
Abs Immature Granulocytes: 0.02 10*3/uL (ref 0.00–0.07)
Basophils Absolute: 0 10*3/uL (ref 0.0–0.1)
Basophils Relative: 0 %
Eosinophils Absolute: 0.1 10*3/uL (ref 0.0–0.5)
Eosinophils Relative: 2 %
HCT: 41.6 % (ref 39.0–52.0)
Hemoglobin: 14 g/dL (ref 13.0–17.0)
Immature Granulocytes: 0 %
Lymphocytes Relative: 25 %
Lymphs Abs: 1.7 10*3/uL (ref 0.7–4.0)
MCH: 29.6 pg (ref 26.0–34.0)
MCHC: 33.7 g/dL (ref 30.0–36.0)
MCV: 87.9 fL (ref 80.0–100.0)
Monocytes Absolute: 0.8 10*3/uL (ref 0.1–1.0)
Monocytes Relative: 12 %
Neutro Abs: 4.1 10*3/uL (ref 1.7–7.7)
Neutrophils Relative %: 61 %
Platelets: 272 10*3/uL (ref 150–400)
RBC: 4.73 MIL/uL (ref 4.22–5.81)
RDW: 13.2 % (ref 11.5–15.5)
WBC: 6.7 10*3/uL (ref 4.0–10.5)
nRBC: 0 % (ref 0.0–0.2)

## 2021-02-09 LAB — BASIC METABOLIC PANEL
Anion gap: 9 (ref 5–15)
BUN: 11 mg/dL (ref 6–20)
CO2: 25 mmol/L (ref 22–32)
Calcium: 9 mg/dL (ref 8.9–10.3)
Chloride: 107 mmol/L (ref 98–111)
Creatinine, Ser: 0.54 mg/dL — ABNORMAL LOW (ref 0.61–1.24)
GFR, Estimated: >60 mL/min (ref >60)
Glucose, Bld: 97 mg/dL (ref 70–99)
Potassium: 4.1 mmol/L (ref 3.5–5.1)
Sodium: 141 mmol/L (ref 135–145)

## 2021-02-09 LAB — URINALYSIS, ROUTINE W REFLEX MICROSCOPIC
Bilirubin Urine: NEGATIVE
Glucose, UA: NEGATIVE mg/dL
Ketones, ur: NEGATIVE mg/dL
Nitrite: NEGATIVE
Protein, ur: NEGATIVE mg/dL
Specific Gravity, Urine: 1.006 (ref 1.005–1.030)
pH: 7 (ref 5.0–8.0)

## 2021-02-09 MED ORDER — SODIUM CHLORIDE 0.9 % IV SOLN: INTRAVENOUS | Status: DC

## 2021-02-09 MED ORDER — CEPHALEXIN 500 MG PO CAPS: 500.0000 mg | ORAL_CAPSULE | Freq: Four times a day (QID) | ORAL | 0 refills | Status: DC

## 2021-02-09 MED ORDER — DIAZEPAM 5 MG PO TABS
5.0000 mg | ORAL_TABLET | Freq: Once | ORAL | Status: AC
  Administered 2021-02-09: 5 mg via ORAL
  Filled 2021-02-09: qty 1

## 2021-02-09 MED ORDER — MORPHINE SULFATE (PF) 4 MG/ML IV SOLN
4.0000 mg | Freq: Once | INTRAVENOUS | Status: AC
  Administered 2021-02-09: 4 mg via INTRAMUSCULAR
  Filled 2021-02-09: qty 1

## 2021-02-09 NOTE — ED Notes (Signed)
Pt. Bladder scan was 69ml. Nurses aware.

## 2021-02-09 NOTE — ED Provider Notes (Addendum)
[facility] DEPT Provider Note   CSN: [id number] Arrival date & time: 02/09/21  7673     History Chief Complaint  Patient presents with  . Flank Pain    Curtis Mann is a 35 y.o. male.  35 year old male with history of prior cervical spine injury who has neurogenic bladder who presents with bilateral flank pain x2 days.  Patient's wife placed a Foley catheter yesterday as he felt he was having urinary retention.  His urologist decreased one of his medications.  He denies any fever or chills.  No emesis.  States he drinks 5 glasses of water and has had decreased urine output.  History of similar symptoms when he had a UTI.        Past Medical History:  Diagnosis Date  . Complication of anesthesia    in 2015 patient states he woke up immediately after surgery , 2016- took him 30 minutes to wake up   . Hypotension occasional  . Neurogenic bladder    foley and i and o caths  . Neurogenic bladder    i and o caths 6-8 times day  . Neurogenic bowel    bowel program with digital stimulation as needed  . Neuromuscular disorder ([place])    Quadriplegic uses electric wheelchair, [employer] fast streamer or Two person Transfer  . Quadriplegic spinal paralysis [facility]) 2009    Patient Active Problem List   Diagnosis Date Noted  . Hematochezia 02/04/2021  . Hemorrhoids 02/04/2021  . Left lower quadrant pain 02/04/2021  . Rectal bleeding 02/04/2021  . Rectal pain 02/04/2021  . 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 Chronic pain syndrome 05/21/2020  . Fall from, out of or through balcony, sequela 05/21/2020  . Malnutrition of mild degree [place]: 75% to less than 90% of standard weight) ([place]) 05/21/2020  . Neurogenic bowel 05/21/2020  . Polyp of colon 05/21/2020  . Urinary incontinence 05/21/2020  . 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 Acute pyelonephritis 11/28/2019  . Pyelonephritis 11/28/2019  . Urinary tract infection associated with catheterization of  urinary tract, initial encounter ([place]) 11/27/2019  . Neurogenic bladder   . Hypotension   . UTI (urinary tract infection) 05/28/2018  . Leukocytosis 12/13/2016  . Community acquired pneumonia of right lower lobe of lung   . Quadriplegia ([place])   . 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 CAP (community acquired pneumonia) 12/12/2016    Past Surgical History:  Procedure Laterality Date  . bicep and tricept tendon transfers bilateral  2011  . bladder botox surgery  2012  . BOTOX INJECTION N/A 03/06/2018   Procedure: BOTOX INJECTION CYSTOSCOPY WITH FULGURATION;  Surgeon: Bjorn Loser, MD;  [facility];  Service: Urology;  Laterality: N/A;  . BOTOX INJECTION N/A 05/14/2019   Procedure: CYSTOSCOPY BOTOX INJECTION;  Surgeon: Bjorn Loser, MD;  [facility];  Service: Urology;  Laterality: N/A;  . CYSTOSCOPY N/A 01/20/2014   Procedure: CYSTOSCOPY WITH BOTOX INJECTION;  Surgeon: Ailene Rud, MD;  [facility];  Service: Urology;  Laterality: N/A;  . CYSTOSCOPY N/A 04/09/2015   Procedure: CYSTOSCOPY WITH BOTOX INJECTION;  Surgeon: Carolan Clines, MD;  [facility];  Service: Urology;  Laterality: N/A;  . CYSTOSCOPY WITH INJECTION  10/11/2012   Procedure: CYSTOSCOPY WITH INJECTION;  Surgeon: Ailene Rud, MD;  [facility];  Service: Urology;  Laterality: N/A;  botox  . CYSTOSCOPY WITH INJECTION N/A 04/29/2016   Procedure: CYSTOSCOPY WITH INJECTION;  Surgeon: Carolan Clines, MD;  Location: [facility];  Service: Urology;  Laterality: N/A;  . 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 VASECTOMY Bilateral 05/14/2019   Procedure: VASECTOMY;  Surgeon: Bjorn Loser, MD;  [facility];  Service: Urology;  Laterality: Bilateral;       Family History  Problem Relation Age of Onset  . Colon cancer Mother   . Pulmonary embolism Mother   . Hypertension Father   . Congestive Heart Failure Paternal Grandfather     Social History    Tobacco Use  . Smoking status: Former Smoker    Packs/day: 0.25    Years: 4.00    Pack years: 1.00    Types: Cigarettes    Quit date: 12/13/2007    Years since quitting: 13.1  . Smokeless tobacco: Never Used  Vaping Use  . Vaping Use: Never used  Substance Use Topics  . Alcohol use: No  . Drug use: No    Home Medications Prior to Admission medications   Medication Sig Start Date End Date Taking? Authorizing Provider  baclofen (LIORESAL) 20 MG tablet Take 20 mg by mouth 3 (three) times daily.    [provider]  calcium carbonate (SUPER CALCIUM) 1500 (600 Ca) MG TABS tablet 1 tablet with meals    [provider]  docusate sodium (COLACE) 100 MG capsule Take 1 capsule (100 mg total) by mouth 2 (two) times daily. Also available OTC 11/29/19   Rai, Vernelle Emerald, MD  HYDROcodone-acetaminophen (NORCO) 5-325 MG tablet Take 1-2 tablets by mouth every 6 (six) hours as needed for moderate pain. 05/14/19   Bjorn Loser, MD  Lidocaine-Hydrocortisone Ace (ANA-LEX) 2-2 % KIT 1 04/28/14   [provider]  loratadine-pseudoephedrine (CLARITIN-D 24-HOUR) 10-240 MG 24 hr tablet Take 1 tablet by mouth daily as needed for allergies.    [provider]  mirabegron ER (MYRBETRIQ) 50 MG TB24 tablet Take 1 tablet by mouth daily. 05/21/20   [provider]  Multiple Vitamin (MULTIVITAMIN WITH MINERALS) TABS tablet Take 1 tablet by mouth daily.    [provider]  nitrofurantoin (MACRODANTIN) 100 MG capsule Take 1 capsule (100 mg total) by mouth daily. HOLD until antibiotic course is completed 11/29/19   Rai, Vernelle Emerald, MD  ondansetron (ZOFRAN ODT) 4 MG disintegrating tablet Take 1 tablet (4 mg total) by mouth every 8 (eight) hours as needed for nausea or vomiting. 11/29/19   Rai, Ripudeep K, MD  oxybutynin (DITROPAN XL) 15 MG 24 hr tablet Take 15 mg by mouth daily as needed (self catheter).     [provider]  polyethylene glycol (MIRALAX) 17 g  packet Take 17 g by mouth daily as needed for moderate constipation. 11/29/19   Rai, Vernelle Emerald, MD    Allergies    Amoxicillin, Bactrim [sulfamethoxazole-trimethoprim], Penicillins, and Sulfa antibiotics  Review of Systems   Review of Systems  All other systems reviewed and are negative.   Physical Exam Updated Vital Signs BP 101/85 (BP Location: Left Arm)   Pulse 92   Temp 97.7 F (36.5 C) (Oral)   Resp 16   SpO2 99%   Physical Exam Vitals and nursing note reviewed.  Constitutional:      General: He is not in acute distress.    Appearance: Normal appearance. He is well-developed and well-nourished. He is not toxic-appearing.  HENT:     Head: Normocephalic and atraumatic.  Eyes:     General: Lids are normal.     Extraocular Movements:  EOM normal.     Conjunctiva/sclera: Conjunctivae normal.     Pupils: Pupils are equal, round, and reactive to light.  Neck:     Thyroid: No thyroid mass.     Trachea: No tracheal deviation.  Cardiovascular:     Rate and Rhythm: Normal rate and regular rhythm.     Heart sounds: Normal heart sounds. No murmur heard. No gallop.   Pulmonary:     Effort: Pulmonary effort is normal. No respiratory distress.     Breath sounds: Normal breath sounds. No stridor. No decreased breath sounds, wheezing, rhonchi or rales.  Abdominal:     General: Bowel sounds are normal. There is no distension.     Palpations: Abdomen is soft.     Tenderness: There is no abdominal tenderness. There is no CVA tenderness or rebound.  Musculoskeletal:        General: No tenderness or edema. Normal range of motion.     Cervical back: Normal range of motion and neck supple.       Back:  Skin:    General: Skin is warm and dry.     Findings: No abrasion or rash.  Neurological:     Mental Status: He is alert and oriented to person, place, and time.     GCS: GCS eye subscore is 4. GCS verbal subscore is 5. GCS motor subscore is 6.     Cranial Nerves: No cranial nerve  deficit.     Sensory: No sensory deficit.     Deep Tendon Reflexes: Strength normal.     Comments: At baseline per patient  Psychiatric:        Mood and Affect: Mood and affect normal.        Speech: Speech normal.        Behavior: Behavior normal.     ED Results / Procedures / Treatments   Labs (all labs ordered are listed, but only abnormal results are displayed) Labs Reviewed  URINE CULTURE  CBC WITH DIFFERENTIAL/PLATELET  BASIC METABOLIC PANEL  URINALYSIS, ROUTINE W REFLEX MICROSCOPIC    EKG None  Radiology No results found.  Procedures Procedures   Medications Ordered in ED Medications  diazepam (VALIUM) tablet 5 mg (has no administration in time range)  morphine 4 MG/ML injection 4 mg (has no administration in time range)    ED Course  I have reviewed the triage vital signs and the nursing notes.  Pertinent labs & imaging results that were available during my care of the patient were reviewed by me and considered in my medical decision making (see chart for details).    MDM Rules/Calculators/A&P                          Patient medicated for pain here and feels better.  Renal CT scan is without acute findings.  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  No leukocytosis.  Patient's renal function normal limits.  Will discharge Final Clinical Impression(s) / ED Diagnoses Final diagnoses:  None    Rx / DC Orders ED Discharge Orders    None       Lacretia Leigh, MD 02/09/21 1404    Lacretia Leigh, MD 02/09/21 1409

## 2021-02-09 NOTE — ED Triage Notes (Signed)
Pt presents with c/o bilateral flank pain. Pt reports he has been taking medication for his bladder for several months but that he started to notice some urinary retention and flank pain several days ago. Pt reports he placed a foley yesterday to empty his bladder.

## 2021-02-11 LAB — URINE CULTURE: Culture: >=100000 — AB

## 2021-02-12 ENCOUNTER — Telehealth: Payer: Self-pay

## 2021-02-12 NOTE — Progress Notes (Signed)
ED Antimicrobial Stewardship Positive Culture Follow Up   Curtis Mann is an 35 y.o. male who presented to [facility] on 02/09/2021 with a chief complaint of  Chief Complaint  Patient presents with  . Flank Pain    Recent Results (from the past 720 hour(s))  Urine Culture     Status: Abnormal   Collection Time: 02/09/21 12:29 PM   Specimen: Urine, Clean Catch  Result Value Ref Range Status   Specimen Description   Final    URINE, CLEAN CATCH Performed at [facility], [address] [place], [place] [address]    Special Requests   Final    NONE Performed at [facility], [address], [place], [place] [address]    Culture (A)  Final    >=100,000 COLONIES/mL ENTEROCOCCUS FAECALIS >=100,000 COLONIES/mL STAPHYLOCOCCUS SAPROPHYTICUS    Report Status 02/11/2021 FINAL  Final   Organism ID, Bacteria ENTEROCOCCUS FAECALIS (A)  Final   Organism ID, Bacteria STAPHYLOCOCCUS SAPROPHYTICUS (A)  Final      Susceptibility   Staphylococcus saprophyticus - MIC*    CIPROFLOXACIN <=0.5 SENSITIVE Sensitive     GENTAMICIN <=0.5 SENSITIVE Sensitive     NITROFURANTOIN <=16 SENSITIVE Sensitive     OXACILLIN SENSITIVE Sensitive     TETRACYCLINE <=1 SENSITIVE Sensitive     VANCOMYCIN <=0.5 SENSITIVE Sensitive     TRIMETH/SULFA <=10 SENSITIVE Sensitive     CLINDAMYCIN <=0.25 SENSITIVE Sensitive     RIFAMPIN <=0.5 SENSITIVE Sensitive     Inducible Clindamycin NEGATIVE Sensitive     * >=100,000 COLONIES/mL STAPHYLOCOCCUS SAPROPHYTICUS   Enterococcus faecalis - MIC*    AMPICILLIN <=2 SENSITIVE Sensitive     NITROFURANTOIN <=16 SENSITIVE Sensitive     VANCOMYCIN 1 SENSITIVE Sensitive     * >=100,000 COLONIES/mL ENTEROCOCCUS FAECALIS    Patient presented to ED with bilateral flank pain. Wife placed foley catheter the day prior in response to suspected urinary retention, but patient continued to have decreased urine output. UA was significant for pyuria, renal  CT was unremarkable, and urine culture grew E. faecalis and Staph saprophyticus.   Patient was originally prescribed Keflex on discharge, but it appears that Dr. Freida Busman then cancelled the prescription given patient's allergy history. However, the fill history indicates that the [name] picked up the Keflex.  Recommend to discontinue Keflex - inadequate drug-bug coverage.   ED Provider: Maryanna Shape, PA-C   [name]  [facility] PharmD Candidate 2022 02/12/2021, 9:30 AM Monday - Friday phone -  [phone] Saturday - Sunday phone - [phone]

## 2021-02-12 NOTE — Telephone Encounter (Signed)
May stop Keflex per Dietrich Pates PA for [facility]  ED 02/09/21

## 2021-03-08 ENCOUNTER — Encounter (HOSPITAL_COMMUNITY): Payer: Self-pay | Admitting: Gastroenterology

## 2021-03-08 ENCOUNTER — Other Ambulatory Visit: Payer: Self-pay

## 2021-03-10 ENCOUNTER — Other Ambulatory Visit (HOSPITAL_COMMUNITY)
Admission: RE | Admit: 2021-03-10 | Discharge: 2021-03-10 | Disposition: A | Discharge status: Home / Self Care | Payer: Medicare Other | Source: Ambulatory Visit | Attending: Gastroenterology | Admitting: Gastroenterology

## 2021-03-10 DIAGNOSIS — Z20822 Contact with and (suspected) exposure to covid-19: Secondary | ICD-10-CM | POA: Insufficient documentation

## 2021-03-10 DIAGNOSIS — Z01812 Encounter for preprocedural laboratory examination: Secondary | ICD-10-CM | POA: Insufficient documentation

## 2021-03-10 LAB — SARS CORONAVIRUS 2 (TAT 6-24 HRS): SARS Coronavirus 2: NEGATIVE

## 2021-03-11 NOTE — Anesthesia Preprocedure Evaluation (Addendum)
Anesthesia Evaluation  Patient identified by MRN, date of birth, ID band Patient awake    Reviewed: Allergy & Precautions, Patient's Chart, lab work & pertinent test results  Airway Mallampati: II  TM Distance: >3 FB Neck ROM: Full    Dental no notable dental hx. (+) Teeth Intact, Dental Advisory Given   Pulmonary pneumonia, former smoker,    Pulmonary exam normal breath sounds clear to auscultation       Cardiovascular Exercise Tolerance: Good Normal cardiovascular exam Rhythm:Regular Rate:Normal     Neuro/Psych c5 Quadraplegic  Neuromuscular disease    GI/Hepatic negative GI ROS, Neg liver ROS,   Endo/Other    Renal/GU Renal disease     Musculoskeletal negative musculoskeletal ROS (+)   Abdominal   Peds  Hematology negative hematology ROS (+)   Anesthesia Other Findings ALL : ssee list  Reproductive/Obstetrics negative OB ROS                           Anesthesia Physical Anesthesia Plan  ASA: III  Anesthesia Plan: MAC   Post-op Pain Management:    Induction: Intravenous  PONV Risk Score and Plan: Treatment may vary due to age or medical condition  Airway Management Planned: Natural Airway and Nasal Cannula  Additional Equipment: None  Intra-op Plan:   Post-operative Plan:   Informed Consent: I have reviewed the patients History and Physical, chart, labs and discussed the procedure including the risks, benefits and alternatives for the proposed anesthesia with the patient or authorized representative who has indicated his/her understanding and acceptance.     Dental advisory given  Plan Discussed with: CRNA  Anesthesia Plan Comments: (Colonoscopy for Family hx of colon ca)       Anesthesia Quick Evaluation

## 2021-03-12 ENCOUNTER — Ambulatory Visit (HOSPITAL_COMMUNITY): Payer: Medicare Other | Admitting: Anesthesiology

## 2021-03-12 ENCOUNTER — Other Ambulatory Visit: Payer: Self-pay

## 2021-03-12 ENCOUNTER — Ambulatory Visit (HOSPITAL_COMMUNITY)
Admission: RE | Admit: 2021-03-12 | Discharge: 2021-03-12 | Disposition: A | Discharge status: Home / Self Care | Payer: Medicare Other | Attending: Gastroenterology | Admitting: Gastroenterology

## 2021-03-12 ENCOUNTER — Encounter (HOSPITAL_COMMUNITY)
Admission: RE | Disposition: A | Discharge status: Home / Self Care | Payer: Self-pay | Source: Home / Self Care | Attending: Gastroenterology

## 2021-03-12 ENCOUNTER — Encounter (HOSPITAL_COMMUNITY): Payer: Self-pay | Admitting: Gastroenterology

## 2021-03-12 DIAGNOSIS — Z8371 Family history of colonic polyps: Secondary | ICD-10-CM | POA: Diagnosis not present

## 2021-03-12 DIAGNOSIS — S14109S Unspecified injury at unspecified level of cervical spinal cord, sequela: Secondary | ICD-10-CM | POA: Diagnosis not present

## 2021-03-12 DIAGNOSIS — Z87891 Personal history of nicotine dependence: Secondary | ICD-10-CM | POA: Diagnosis not present

## 2021-03-12 DIAGNOSIS — Z800 Family history of malignant neoplasm of digestive organs: Secondary | ICD-10-CM | POA: Diagnosis not present

## 2021-03-12 DIAGNOSIS — G9050 Complex regional pain syndrome I, unspecified: Secondary | ICD-10-CM | POA: Diagnosis not present

## 2021-03-12 DIAGNOSIS — Q438 Other specified congenital malformations of intestine: Secondary | ICD-10-CM | POA: Diagnosis not present

## 2021-03-12 DIAGNOSIS — K644 Residual hemorrhoidal skin tags: Secondary | ICD-10-CM | POA: Diagnosis not present

## 2021-03-12 DIAGNOSIS — W19XXXS Unspecified fall, sequela: Secondary | ICD-10-CM | POA: Insufficient documentation

## 2021-03-12 DIAGNOSIS — K5909 Other constipation: Secondary | ICD-10-CM | POA: Diagnosis not present

## 2021-03-12 DIAGNOSIS — G8250 Quadriplegia, unspecified: Secondary | ICD-10-CM | POA: Insufficient documentation

## 2021-03-12 DIAGNOSIS — K625 Hemorrhage of anus and rectum: Secondary | ICD-10-CM | POA: Insufficient documentation

## 2021-03-12 DIAGNOSIS — K6289 Other specified diseases of anus and rectum: Secondary | ICD-10-CM | POA: Insufficient documentation

## 2021-03-12 DIAGNOSIS — K648 Other hemorrhoids: Secondary | ICD-10-CM | POA: Insufficient documentation

## 2021-03-12 HISTORY — PX: COLONOSCOPY WITH PROPOFOL: SHX5780

## 2021-03-12 SURGERY — COLONOSCOPY WITH PROPOFOL
Anesthesia: Monitor Anesthesia Care

## 2021-03-12 MED ORDER — PROPOFOL 500 MG/50ML IV EMUL
INTRAVENOUS | Status: DC | PRN
  Administered 2021-03-12: 125 ug/kg/min via INTRAVENOUS
  Administered 2021-03-12: 135 ug/kg/min via INTRAVENOUS

## 2021-03-12 MED ORDER — PROPOFOL 500 MG/50ML IV EMUL
INTRAVENOUS | Status: DC | PRN
  Administered 2021-03-12 (×2): 20 mg via INTRAVENOUS

## 2021-03-12 MED ORDER — DEXMEDETOMIDINE (PRECEDEX) IN NS 20 MCG/5ML (4 MCG/ML) IV SYRINGE
PREFILLED_SYRINGE | INTRAVENOUS | Status: DC | PRN
  Administered 2021-03-12: 4 ug via INTRAVENOUS

## 2021-03-12 MED ORDER — DEXMEDETOMIDINE (PRECEDEX) IN NS 20 MCG/5ML (4 MCG/ML) IV SYRINGE
PREFILLED_SYRINGE | INTRAVENOUS | Status: AC
  Filled 2021-03-12: qty 5

## 2021-03-12 MED ORDER — LACTATED RINGERS IV SOLN: INTRAVENOUS | Status: DC

## 2021-03-12 MED ORDER — PROPOFOL 10 MG/ML IV BOLUS
INTRAVENOUS | Status: AC
  Filled 2021-03-12: qty 20

## 2021-03-12 MED ORDER — SODIUM CHLORIDE 0.9 % IV SOLN: INTRAVENOUS | Status: DC

## 2021-03-12 MED ORDER — PROPOFOL 1000 MG/100ML IV EMUL
INTRAVENOUS | Status: AC
  Filled 2021-03-12: qty 100

## 2021-03-12 SURGICAL SUPPLY — 21 items

## 2021-03-12 NOTE — Transfer of Care (Signed)
Immediate Anesthesia Transfer of Care Note  Patient: RITHIK ODEA  Procedure(s) Performed: COLONOSCOPY WITH PROPOFOL (N/A )  Patient Location: PACU  Anesthesia Type:MAC  Level of Consciousness: awake, alert  and oriented  Airway & Oxygen Therapy: Patient Spontanous Breathing and Patient connected to face mask oxygen  Post-op Assessment: Report given to RN and Post -op Vital signs reviewed and stable  Post vital signs: Reviewed and stable  Last Vitals:  Vitals Value Taken Time  BP    Temp    Pulse 80 03/12/21 1206  Resp 12 03/12/21 1206  SpO2 100 % 03/12/21 1206  Vitals shown include unvalidated device data.  Last Pain:  Vitals:   03/12/21 1057  TempSrc: Oral  PainSc: 6          Complications: No complications documented.

## 2021-03-12 NOTE — Op Note (Signed)
[facility] Patient Name: Curtis MendsLeo Mann Procedure Date: 03/12/2021 MRN: [id number] Attending MD: Kathi DerParag Hayk Divis , MD Date of Birth: 1986/03/14 CSN: [id number] Age: [age] Admit Type: Outpatient Procedure:                Colonoscopy Indications:              This is the patient's first colonoscopy, Rectal                            bleeding, Family history of colon cancer in a                            first-degree relative before age 35 years, Family                            history of colonic polyps in a first-degree relative Providers:                Kathi DerParag Kvon Mcilhenny, MD, Fayrene FearingLaura Turner, RN, Leanne LovelyAllison                            Townsend, Technician, Yevonne PaxElizabeth Pulliam, CRNA Referring MD:              Medicines:                Sedation Administered by an Anesthesia Professional Complications:            No immediate complications. Estimated Blood Loss:     Estimated blood loss was minimal. Procedure:                Pre-Anesthesia Assessment:                           - Prior to the procedure, a History and Physical                            was performed, and patient medications and                            allergies were reviewed. The patient's tolerance of                            previous anesthesia was also reviewed. The risks                            and benefits of the procedure and the sedation                            options and risks were discussed with the patient.                            All questions were answered, and informed consent                            was obtained. Prior Anticoagulants: The patient has  taken no previous anticoagulant or antiplatelet                            agents. ASA Grade Assessment: III - A patient with                            severe systemic disease. After reviewing the risks                            and benefits, the patient was deemed in                            satisfactory  condition to undergo the procedure.                           After obtaining informed consent, the colonoscope                            was passed under direct vision. Throughout the                            procedure, the patient's blood pressure, pulse, and                            oxygen saturations were monitored continuously. The                            PCF-H190DL ([id number]) Olympus pediatric colonscope                            was introduced through the anus and advanced to the                            the terminal ileum, with identification of the                            appendiceal orifice and IC valve. The colonoscopy                            was performed without difficulty. The patient                            tolerated the procedure well. The quality of the                            bowel preparation was good. Scope In: 11:37:57 AM Scope Out: 12:00:51 PM Scope Withdrawal Time: 0 hours 13 minutes 1 second  Total Procedure Duration: 0 hours 22 minutes 54 seconds  Findings:      Skin tags were found on perianal exam.      The terminal ileum appeared normal.      The colon (entire examined portion) was moderately redundant. Advancing       the scope required applying abdominal pressure.      A localized area of mildly inflamed  mucosa was found in the distal       rectum.      Internal hemorrhoids were found during retroflexion. The hemorrhoids       were medium-sized.      The exam was otherwise without abnormality on direct and retroflexion       views. Impression:               - Perianal skin tags found on perianal exam.                           - The examined portion of the ileum was normal.                           - Redundant colon.                           - Inflamed mucosa in the distal rectum.                           - Internal hemorrhoids.                           - The examination was otherwise normal on direct                             and retroflexion views.                           - No specimens collected. Moderate Sedation:      Moderate (conscious) sedation was personally administered by an       anesthesia professional. The following parameters were monitored: oxygen       saturation, heart rate, blood pressure, and response to care. Recommendation:           - Patient has a contact number available for                            emergencies. The signs and symptoms of potential                            delayed complications were discussed with the                            patient. Return to normal activities tomorrow.                            Written discharge instructions were provided to the                            patient.                           - Resume previous diet.                           - Continue present medications.                           -  Repeat colonoscopy in 5 years for screening                            purposes.                           - Return to my office PRN. Procedure Code(s):        --- Professional ---                           [phone], Colonoscopy, flexible; diagnostic, including                            collection of specimen(s) by brushing or washing,                            when performed (separate procedure) Diagnosis Code(s):        --- Professional ---                           K64.8, Other hemorrhoids                           K62.89, Other specified diseases of anus and rectum                           K64.4, Residual hemorrhoidal skin tags                           K62.5, Hemorrhage of anus and rectum                           Z80.0, Family history of malignant neoplasm of                            digestive organs                           Z83.71, Family history of colonic polyps                           Q43.8, Other specified congenital malformations of                            intestine CPT copyright 2019 [employer]. All rights  reserved. The codes documented in this report are preliminary and upon coder review may  be revised to meet current compliance requirements. Kathi Der, MD Kathi Der, MD 03/12/2021 12:11:49 PM Number of Addenda: 0

## 2021-03-12 NOTE — Discharge Instructions (Signed)
YOU HAD AN ENDOSCOPIC PROCEDURE TODAY: Refer to the procedure report and other information in the discharge instructions given to you for any specific questions about what was found during the examination. If this information does not answer your questions, please call [facility] office at [phone] to clarify.   YOU SHOULD EXPECT: Some feelings of bloating in the abdomen. Passage of more gas than usual. Walking can help get rid of the air that was put into your GI tract during the procedure and reduce the bloating. If you had a lower endoscopy (such as a colonoscopy or flexible sigmoidoscopy) you may notice spotting of blood in your stool or on the toilet paper. Some abdominal soreness may be present for a day or two, also.  DIET: Your first meal following the procedure should be a light meal and then it is ok to progress to your normal diet. A half-sandwich or bowl of soup is an example of a good first meal. Heavy or fried foods are harder to digest and may make you feel nauseous or bloated. Drink plenty of fluids but you should avoid alcoholic beverages for 24 hours. If you had a esophageal dilation, please see attached instructions for diet.    ACTIVITY: Your care partner should take you home directly after the procedure. You should plan to take it easy, moving slowly for the rest of the day. You can resume normal activity the day after the procedure however YOU SHOULD NOT DRIVE, use power tools, machinery or perform tasks that involve climbing or major physical exertion for 24 hours (because of the sedation medicines used during the test).   SYMPTOMS TO REPORT IMMEDIATELY: A gastroenterologist can be reached at any hour. Please call [phone]  for any of the following symptoms:  Following lower endoscopy (colonoscopy, flexible sigmoidoscopy) Excessive amounts of blood in the stool  Significant tenderness, worsening of abdominal pains  Swelling of the abdomen that is new, acute  Fever of 100  or higher  Following upper endoscopy (EGD, EUS, ERCP, esophageal dilation) Vomiting of blood or coffee ground material  New, significant abdominal pain  New, significant chest pain or pain under the shoulder blades  Painful or persistently difficult swallowing  New shortness of breath  Black, tarry-looking or red, bloody stools  FOLLOW UP:  If any biopsies were taken you will be contacted by phone or by letter within the next 1-3 weeks. Call [phone]  if you have not heard about the biopsies in 3 weeks.  Please also call with any specific questions about appointments or follow up tests. YOU HAD AN ENDOSCOPIC PROCEDURE TODAY: Refer to the procedure report and other information in the discharge instructions given to you for any specific questions about what was found during the examination. If this information does not answer your questions, please call [facility] office at [phone] to clarify.   YOU SHOULD EXPECT: Some feelings of bloating in the abdomen. Passage of more gas than usual. Walking can help get rid of the air that was put into your GI tract during the procedure and reduce the bloating. If you had a lower endoscopy (such as a colonoscopy or flexible sigmoidoscopy) you may notice spotting of blood in your stool or on the toilet paper. Some abdominal soreness may be present for a day or two, also.  DIET: Your first meal following the procedure should be a light meal and then it is ok to progress to your normal diet. A half-sandwich or bowl of soup is an   example of a good first meal. Heavy or fried foods are harder to digest and may make you feel nauseous or bloated. Drink plenty of fluids but you should avoid alcoholic beverages for 24 hours. If you had a esophageal dilation, please see attached instructions for diet.    ACTIVITY: Your care partner should take you home directly after the procedure. You should plan to take it easy, moving slowly for the rest of the day. You can resume  normal activity the day after the procedure however YOU SHOULD NOT DRIVE, use power tools, machinery or perform tasks that involve climbing or major physical exertion for 24 hours (because of the sedation medicines used during the test).   SYMPTOMS TO REPORT IMMEDIATELY: A gastroenterologist can be reached at any hour. Please call [phone]  for any of the following symptoms:  Following lower endoscopy (colonoscopy, flexible sigmoidoscopy) Excessive amounts of blood in the stool  Significant tenderness, worsening of abdominal pains  Swelling of the abdomen that is new, acute  Fever of 100 or higher    FOLLOW UP:  If any biopsies were taken you will be contacted by phone or by letter within the next 1-3 weeks. Call [phone]  if you have not heard about the biopsies in 3 weeks.  Please also call with any specific questions about appointments or follow up tests. 

## 2021-03-12 NOTE — Anesthesia Procedure Notes (Signed)
Procedure Name: MAC Date/Time: 03/12/2021 11:30 AM Performed by: Niel Hummer, CRNA Pre-anesthesia Checklist: Patient identified, Emergency Drugs available, Suction available and Patient being monitored Oxygen Delivery Method: Simple face mask

## 2021-03-12 NOTE — Anesthesia Postprocedure Evaluation (Signed)
Anesthesia Post Note  Patient: Curtis Mann  Procedure(s) Performed: COLONOSCOPY WITH PROPOFOL (N/A )     Patient location during evaluation: Endoscopy Anesthesia Type: MAC Level of consciousness: awake and alert Pain management: pain level controlled Vital Signs Assessment: post-procedure vital signs reviewed and stable Respiratory status: spontaneous breathing, nonlabored ventilation, respiratory function stable and patient connected to nasal cannula oxygen Cardiovascular status: blood pressure returned to baseline and stable Postop Assessment: no apparent nausea or vomiting Anesthetic complications: no   No complications documented.  Last Vitals:  Vitals:   03/12/21 1206 03/12/21 1212  BP: (!) 91/46 (!) 90/50  Pulse: 80 75  Resp: 12 11  Temp: 36.5 C   SpO2: 100% 98%    Last Pain:  Vitals:   03/12/21 1206  TempSrc: Oral  PainSc: 3                  Barnet Glasgow

## 2021-03-12 NOTE — H&P (Signed)
Primary Care Physician:  Florentina Jenny, MD Primary Gastroenterologist:  Dr. 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            The patient presents with problems defecating from his paraplegia and He requires digital stimulation to have a bowel movement. He takes Metamucil on a regular basis. Currently having bowel movement every other day. Complaining of seeing intermittent blood on the tissue paper. Complaining of left-sided abdominal discomfort which is chronic in nature and gets better with bowel movement. Denies any upper GI symptoms.  Mother was diagnosed with colon cancer.  Sister was diagnosed with colon polyps in her 5s.  Father also diagnosed with colon polyps  Past Medical History:  Diagnosis Date  . Complication of anesthesia    in 2015 patient states he woke up immediately after surgery , 2016- took him 30 minutes to wake up   . Hypotension occasional  . Neurogenic bladder    foley and i and o caths  . Neurogenic bladder    i and o caths 6-8 times day  . Neurogenic bowel    bowel program with digital stimulation as needed  . Neuromuscular disorder (HCC)    Quadriplegic uses electric wheelchair, Nurse, adult or Two person Transfer  . Quadriplegic spinal paralysis (HCC) 2009    Past Surgical History:  Procedure Laterality Date  . bicep and tricept tendon transfers bilateral  2011  . bladder botox surgery  2012  . BOTOX INJECTION N/A 03/06/2018   Procedure: BOTOX INJECTION CYSTOSCOPY WITH FULGURATION;  Surgeon: Alfredo Martinez, MD;  Location: [facility];  Service: Urology;  Laterality: N/A;  . BOTOX INJECTION N/A 05/14/2019   Procedure: CYSTOSCOPY BOTOX INJECTION;  Surgeon: Alfredo Martinez, MD;  Location: [facility];  Service: Urology;  Laterality: N/A;  . CYSTOSCOPY N/A 01/20/2014   Procedure: CYSTOSCOPY WITH BOTOX INJECTION;  Surgeon: Kathi Ludwig, MD;  Location: [facility];  Service: Urology;  Laterality: N/A;  . CYSTOSCOPY N/A 04/09/2015   Procedure: CYSTOSCOPY WITH BOTOX INJECTION;  Surgeon: Jethro Bolus, MD;  Location: [facility];  Service: Urology;  Laterality: N/A;  . CYSTOSCOPY WITH INJECTION  10/11/2012   Procedure: CYSTOSCOPY WITH INJECTION;  Surgeon: Kathi Ludwig, MD;  Location: [facility];  Service: Urology;  Laterality: N/A;  botox  . CYSTOSCOPY WITH INJECTION N/A 04/29/2016   Procedure: CYSTOSCOPY WITH INJECTION;  Surgeon: Jethro Bolus, MD;  Location: [facility];  Service: Urology;  Laterality: N/A;  . 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 VASECTOMY Bilateral 05/14/2019   Procedure: VASECTOMY;  Surgeon: Alfredo Martinez, MD;  Location: [facility];  Service: Urology;  Laterality: Bilateral;    Prior to Admission medications   Medication Sig Start Date End Date Taking? Authorizing Provider  baclofen (LIORESAL) 20 MG tablet Take 20 mg by mouth 3 (three) times daily.   Yes [provider]  CRANBERRY PO Take 2 tablets by mouth daily.   Yes [provider]  HYDROcodone-acetaminophen (NORCO) 5-325 MG tablet Take 1-2 tablets by mouth every 6 (six) hours as needed for moderate pain. Patient taking differently: Take 1 tablet by mouth 4 (four) times daily as needed for moderate pain. 05/14/19  Yes Alfredo Martinez, MD  Multiple  Vitamin (MULTIVITAMIN WITH MINERALS) TABS tablet Take 1 tablet by mouth daily.   Yes [provider]  oxybutynin (DITROPAN XL) 15 MG 24 hr tablet Take 30 mg by mouth daily.   Yes [provider]  triamcinolone ointment (KENALOG) 0.1 % Apply 1 application topically daily as needed for dry skin. Left hand & right elbow 02/01/21  Yes [provider]  mirabegron ER (MYRBETRIQ) 50 MG TB24 tablet  Take 1 tablet by mouth daily. 05/21/20   [provider]    Scheduled Meds: Continuous Infusions: . sodium chloride    . lactated ringers 10 mL/hr at 03/12/21 1106   PRN Meds:.  Allergies as of 02/03/2021 - Review Complete 10/11/2020  Allergen Reaction Noted  . Amoxicillin Hives 04/30/2019  . Bactrim [sulfamethoxazole-trimethoprim] Hives 03/06/2018  . Penicillins Hives 04/30/2019    Family History  Problem Relation Age of Onset  . Colon cancer Mother   . Pulmonary embolism Mother   . Hypertension Father   . Congestive Heart Failure Paternal Grandfather     Social History   Socioeconomic History  . Marital status: Single    Spouse name: Not on file  . Number of children: Not on file  . Years of education: Not on file  . Highest education level: Not on file  Occupational History  . Not on file  Tobacco Use  . Smoking status: Former Smoker    Packs/day: 0.25    Years: 4.00    Pack years: 1.00    Types: Cigarettes    Quit date: 12/13/2007    Years since quitting: 13.2  . Smokeless tobacco: Never Used  Vaping Use  . Vaping Use: Never used  Substance and Sexual Activity  . Alcohol use: No  . Drug use: No  . Sexual activity: Not on file  Other Topics Concern  . Not on file  Social History Narrative  . Not on file   Social Determinants of Health   Financial Resource Strain: Not on file  Food Insecurity: Not on file  Transportation Needs: Not on file  Physical Activity: Not on file  Stress: Not on file  Social Connections: Not on file  Intimate Partner Violence: Not on file     Physical Exam: Vital signs: Vitals:   03/12/21 1057  BP: (!) 107/48  Pulse: 72  Resp: 13  Temp: 97.8 F (36.6 C)  SpO2: 99%     General:   Alert,  Well-developed, well-nourished, pleasant and cooperative in NAD Lungs:  Clear throughout to auscultation.   No wheezes, crackles, or rhonchi. No acute distress. Heart:  Regular rate and rhythm; no murmurs, clicks, rubs,   or gallops. Abdomen: Soft, nontender, nondistended, bowel sounds present Rectal:  Deferred  GI:  Lab Results: No results for input(s): WBC, HGB, HCT, PLT in the last 72 hours. BMET No results for input(s): NA, K, CL, CO2, GLUCOSE, BUN, CREATININE, CALCIUM in the last 72 hours. LFT No results for input(s): PROT, ALBUMIN, AST, ALT, ALKPHOS, BILITOT, BILIDIR, IBILI in the last 72 hours. PT/INR No results for input(s): LABPROT, INR in the last 72 hours.   Studies/Results: No results found.  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    LOS: 0 days   Kathi Der  MD, FACP 03/12/2021, 11:06 AM  Contact #  [phone]

## 2021-03-14 ENCOUNTER — Encounter (HOSPITAL_COMMUNITY): Payer: Self-pay | Admitting: Gastroenterology

## 2021-08-01 ENCOUNTER — Emergency Department (HOSPITAL_COMMUNITY): Payer: Medicare Other

## 2021-08-01 ENCOUNTER — Encounter (HOSPITAL_COMMUNITY): Payer: Self-pay | Admitting: Oncology

## 2021-08-01 ENCOUNTER — Other Ambulatory Visit: Payer: Self-pay

## 2021-08-01 ENCOUNTER — Emergency Department (HOSPITAL_COMMUNITY)
Admission: EM | Admit: 2021-08-01 | Discharge: 2021-08-01 | Disposition: A | Discharge status: Home / Self Care | Payer: Medicare Other | Attending: Emergency Medicine | Admitting: Emergency Medicine

## 2021-08-01 DIAGNOSIS — S99922A Unspecified injury of left foot, initial encounter: Secondary | ICD-10-CM | POA: Diagnosis not present

## 2021-08-01 DIAGNOSIS — W231XXA Caught, crushed, jammed, or pinched between stationary objects, initial encounter: Secondary | ICD-10-CM | POA: Diagnosis not present

## 2021-08-01 DIAGNOSIS — Z87891 Personal history of nicotine dependence: Secondary | ICD-10-CM | POA: Insufficient documentation

## 2021-08-01 DIAGNOSIS — S93602A Unspecified sprain of left foot, initial encounter: Secondary | ICD-10-CM

## 2021-08-01 NOTE — ED Triage Notes (Signed)
Pt presents d/t left foot injury that happened 4 days ago.  Pt states he rammed his foot into the car without realizing it.  States they have been using RICE w/o relief of swelling.

## 2021-08-01 NOTE — Discharge Instructions (Addendum)
It was our pleasure to provide your ER care today - we hope that you feel better.  Your xrays look good - no fracture is seen.   If swelling, elevate foot.   Follow up with primary care doctor in 1-2 weeks if symptoms fail to improve/resolve.  Also have your blood pressure rechecked then, as it is high today.   Return to ER if worse, new symptoms, fevers, increased swelling, redness, or other concern.
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
## 2022-04-19 ENCOUNTER — Other Ambulatory Visit: Payer: Self-pay | Admitting: *Deleted

## 2022-04-19 ENCOUNTER — Encounter: Payer: Self-pay | Admitting: *Deleted

## 2022-04-20 ENCOUNTER — Ambulatory Visit: Payer: Medicare Other | Admitting: Diagnostic Neuroimaging

## 2022-06-01 ENCOUNTER — Ambulatory Visit: Payer: Medicare Other | Admitting: Diagnostic Neuroimaging

## 2022-06-01 ENCOUNTER — Encounter: Payer: Self-pay | Admitting: Diagnostic Neuroimaging

## 2023-07-11 ENCOUNTER — Ambulatory Visit: Payer: Medicare Other | Attending: Nurse Practitioner | Admitting: Occupational Therapy

## 2023-07-11 ENCOUNTER — Other Ambulatory Visit: Payer: Self-pay

## 2023-07-11 ENCOUNTER — Encounter: Payer: Self-pay | Admitting: Occupational Therapy

## 2023-07-11 DIAGNOSIS — M6281 Muscle weakness (generalized): Secondary | ICD-10-CM | POA: Diagnosis present

## 2023-07-11 DIAGNOSIS — R29818 Other symptoms and signs involving the nervous system: Secondary | ICD-10-CM | POA: Insufficient documentation

## 2023-07-11 DIAGNOSIS — R29898 Other symptoms and signs involving the musculoskeletal system: Secondary | ICD-10-CM | POA: Diagnosis present

## 2023-07-11 DIAGNOSIS — R278 Other lack of coordination: Secondary | ICD-10-CM | POA: Diagnosis present

## 2023-07-11 NOTE — Therapy (Signed)
OUTPATIENT OCCUPATIONAL THERAPY NEURO EVALUATION  Patient Name: Curtis Mann MRN: [id number] DOB:04-05-1986, 37 y.o., male Today's Date: 07/11/2023  PCP: Dr. Darleene Cleaver (house calls)  REFERRING PROVIDER: Nechama Guard, FNP  END OF SESSION:  OT End of Session - 07/11/23 1400     Visit Number 1    Number of Visits 8   + evaluation   Date for OT Re-Evaluation 09/22/23    Authorization Type Medicare & Medicaid Covered 100%    Progress Note Due on Visit 10    OT Start Time 1400    OT Stop Time 1445    OT Time Calculation (min) 45 min    Equipment Utilized During Treatment Testing material    Activity Tolerance Patient tolerated treatment well    Behavior During Therapy [facility] for tasks assessed/performed             Past Medical History:  Diagnosis Date   Complication of anesthesia    in 2015 patient states he woke up immediately after surgery , 2016- took him 30 minutes to wake up    Hypotension occasional   Neurogenic bladder    foley and i and o caths   Neurogenic bladder    i and o caths 6-8 times day   Neurogenic bowel    bowel program with digital stimulation as needed   Neuromuscular disorder (HCC)    Quadriplegic uses electric wheelchair, [employer] or Two person Transfer   Quadriplegic spinal paralysis (HCC) 2009   Speech disturbance    Past Surgical History:  Procedure Laterality Date   bicep and tricept tendon transfers bilateral  2011   bladder botox surgery  2012   BOTOX INJECTION N/A 03/06/2018   Procedure: BOTOX INJECTION CYSTOSCOPY WITH FULGURATION;  Surgeon: Alfredo Martinez, MD;  [facility];  Service: Urology;  Laterality: N/A;   BOTOX INJECTION N/A 05/14/2019   Procedure: CYSTOSCOPY BOTOX INJECTION;  Surgeon: Alfredo Martinez, MD;  Location: [facility];  Service: Urology;  Laterality: N/A;   COLONOSCOPY WITH PROPOFOL N/A 03/12/2021   Procedure: COLONOSCOPY WITH PROPOFOL;  Surgeon: Kathi Der, MD;  [facility];  Service:  Gastroenterology;  Laterality: N/A;   CYSTOSCOPY N/A 01/20/2014   Procedure: CYSTOSCOPY WITH BOTOX INJECTION;  Surgeon: Kathi Ludwig, MD;  [facility]: [facility];  Service: Urology;  Laterality: N/A;   CYSTOSCOPY N/A 04/09/2015   Procedure: CYSTOSCOPY WITH BOTOX INJECTION;  Surgeon: Jethro Bolus, MD;  Location: [facility];  Service: Urology;  Laterality: N/A;   CYSTOSCOPY WITH INJECTION  10/11/2012   Procedure: CYSTOSCOPY WITH INJECTION;  Surgeon: Kathi Ludwig, MD;  Location: [facility];  Service: Urology;  Laterality: N/A;  botox   CYSTOSCOPY WITH INJECTION N/A 04/29/2016   Procedure: CYSTOSCOPY WITH INJECTION;  Surgeon: Jethro Bolus, MD;  Location: [facility];  Service: Urology;  Laterality: N/A;   surgery for spinal cord injury C 4 to C 6  2009   VASECTOMY Bilateral 05/14/2019   Procedure: VASECTOMY;  Surgeon: Alfredo Martinez, MD;  Location: [facility];  Service: Urology;  Laterality: Bilateral;   Patient Active Problem List   Diagnosis Date Noted   Hematochezia 02/04/2021   Hemorrhoids 02/04/2021   Left lower quadrant pain 02/04/2021   Rectal bleeding 02/04/2021   Rectal pain 02/04/2021   Urethral stricture due to infection 06/11/2020   Chronic pain syndrome 05/21/2020   Fall from, out of or through balcony, sequela 05/21/2020   Malnutrition of mild degree Lily Kocher: 75% to less than 90% of standard weight) (  HCC) 05/21/2020   Neurogenic bowel 05/21/2020   Polyp of colon 05/21/2020   Urinary incontinence 05/21/2020   Wheelchair dependence 05/21/2020   Acute pyelonephritis 11/28/2019   Pyelonephritis 11/28/2019   Urinary tract infection associated with catheterization of urinary tract, initial encounter (HCC) 11/27/2019   Neurogenic bladder    Hypotension    UTI (urinary tract infection) 05/28/2018   Leukocytosis 12/13/2016   Community acquired pneumonia of right lower lobe of lung    Quadriplegia (HCC)    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  His sister has gotten access to a [employer] with hand controls but the setup is different than what he drove previously and although he will likely need assistance with the transfers he would like to work on the motor control need to possibly manage the hand controls so he can possibly enjoy the freedom of driving again. In addition, he previously used 'Active hands' grip/gloves to help him hold things ie) rake, bat etc. And he would like to explore options to help him with activities from his WC.  Patient presented in a power WC today which he reports is "new" to him as of < 1 week.   Pt accompanied by: self  PERTINENT HISTORY:   PMHx of chronic pain syndrome, hypotension, incontinence, tetraplegia.  Patient reports a bicep/tricep tendon transfer surgery to give him more control of arm extension.  PRECAUTIONS: Fall and Other: Skin integrity  WEIGHT BEARING RESTRICTIONS:  Unable  PAIN:  Are you having pain? Yes: NPRS scale: 3/10 Pain location: lower back, shoulders, elbows and forearms Pain description: achy pain Aggravating factors: new WC (since Friday) has corrected posture and contributed to changes Relieving factors: stretching and loosening muscles LB pain is constant since receiving new WC.  Shoulders, elbows and forearms is regular chronic pian "Adapting to new WC"  FALLS: Has patient fallen in last 6 months? 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  PATIENT GOALS: Increased strength and coordination for increased use of his hands for daily activities and potential driving.  He uses his mouth for various activities and his children are starting to copy him biting things.  OBJECTIVE:   HAND DOMINANCE: Right - writing and driving but Left hand is stronger to hold objects.  ADLs: Overall ADLs: Wife is his aide at home (paid position) Transfers/ambulation related to ADLs: Sliding board or squat pivot with extensive assistance from caregiver Eating: spouse cuts steak etc Grooming: can use electric razor himself UB Dressing: Can help move his arms etc during dressing LB Dressing: Completed in bed Toileting: Digital stimulation by caregiver every other day for BM.  He can self cath  but has had an indwelling cath x 3 weeks - coming off today or tomorrow) Bathing: Assisted by spouse Tub Shower transfers: Squat pivot to the shower chair Equipment: Shower seat with back, Walk in shower, Reacher, Feeding equipment, and U-cuff  IADLs: Shopping: Spouse Light housekeeping: Spouse Meal Prep: Spouse Community mobility: Para transit ([employer] [place]) power WC Medication management: wife gets medicine for him and he takes  Landscape architect: Online - does  Handwriting:  NT  MOBILITY STATUS:  power WC  POSTURE COMMENTS:  rounded shoulders, forward head, decreased lumbar lordosis, increased thoracic kyphosis, and flexed trunk  Sitting balance:  Needs assistance.  Time constraints of eval did not allow for out of WC assessment - will explore  further  ACTIVITY TOLERANCE: Activity tolerance: Limited   FUNCTIONAL OUTCOME MEASURES: Modified Barthel Index: Self Care Assessment - 21/100 Chair/Bed Transfers: 3/15 Ambulation: na/15 OR WC Mobility: 5/5 Stair Climbing: 0/10 Toilet Transfers: 2/10 Bowel Control: 2/10 Bladder Control: 0/10 Bathing: 1/5 Dressing: 2/10 Personal Hygiene: 1/5 Feeding: 5/10  Interpretation: 0-20 - Total Dependence 21-60 - Severe Dependence 61-90 - Moderate Dependence 91-99 - Slight Dependence 100 - Independence  UPPER EXTREMITY ROM:    Active ROM Right eval Left eval  Shoulder flexion 90 90  Shoulder abduction 90 90  Shoulder adduction    Shoulder extension    Shoulder internal rotation    Shoulder external rotation    Elbow flexion [facility] WFL  Elbow extension Decreased end range   Wrist flexion    Wrist extension    Wrist ulnar deviation    Wrist radial deviation    Wrist pronation    Wrist supination    (Blank rows = not tested)  UPPER EXTREMITY MMT:     MMT Right eval Left eval  Shoulder flexion 3-/5 3-/5  Shoulder abduction 3-/5 3-/5  Shoulder adduction    Shoulder extension    Shoulder internal rotation    Shoulder external rotation    Middle trapezius    Lower trapezius    Elbow flexion 3/5 3/5  Elbow extension 3-/5   Wrist flexion N/a  against gravity  Wrist extension    Wrist ulnar deviation    Wrist radial deviation    Wrist pronation    Wrist supination    (Blank rows = not tested)  Patient has no active finger extension - digital position dependent on tenodesis  HAND FUNCTION:  Grip strength: unable to squeeze with wrist neutral with wrist extended he was able to squeeze enough to activate dynamometer - Right: 1.3 lbs, Left: 2.2 lbs  COORDINATION: NT  SENSATION: Light touch: Impaired  and Paraesthesia UE  EDEMA: NA  MUSCLE TONE: RUE: Moderate and Hypertonic and LUE: Moderate and Hypertonic  COGNITION: Overall cognitive status: Within  functional limits for tasks assessed  VISION: Baseline vision: No visual deficits  VISION ASSESSMENT: Not tested  PERCEPTION: Not tested  PRAXIS: Not tested  OBSERVATIONS: Pt arrived in his new power WC with R hand control and access to his Quadtool - reacher across his lap.  Pt has decreased lumbar lordosis and obvious B tenodesis grip with the wrists extended and PIP/DIP flexion of digits.   TODAY'S TREATMENT:  DATE: NA   PATIENT EDUCATION: Education details: OT role and POC considerations Person educated: Patient Education method: Explanation Education comprehension: verbalized understanding  HOME EXERCISE PROGRAM: TBD   GOALS: Goals reviewed with patient? Yes  SHORT TERM GOALS: Target date: 08/11/23  Patient will demonstrate UE HEP with 25% verbal cues or less for proper execution.  Baseline: New to outpatient OT  Goal status: INITIAL  Patient will be able to sit EOB with SBA x 5 minutes to assist with increased ease with sliding board transfers. Baseline: TBD Goal status: INITIAL  3.  Patient will be aware of AE/modified setup/techniques to get things in/out or fridge/cabinet etc. Baseline: Relies on spouse Goal status: INITIAL  4.  Patient will be aware of AE/modified setup/techniques to minimize use of mouth/increase use of hands for daily activities  Baseline: Self reported concerns re: children copying him "biting" things and re: sanitariness of using his mouth. Goal status: INITIAL   LONG TERM GOALS: Target date: 09/08/23  Patient will be able to perform UE motions necessary for driving with hand controls.  Baseline: Access to vehicle with hand controls (different than previous controls he used) Goal status: INITIAL  2.  Patient will have resources needed for modifications or training for driving with SCI  Baseline: Uses public  transportation.  Goal status: INITIAL  3.  Patient will be aware of AE and modified techniques to increase ability to make different foods safely with proper setup, access to equipment etc Baseline: Relies on spouse Goal status: INITIAL  ASSESSMENT:  CLINICAL IMPRESSION: Pt is 37 year old previously R hand dominant male who presents for occupational therapy eval secondary to SCI with functional quadriplegia as all limbs are affected. Hx includes no recent OT services. Please refer above for additional medical history. Pt reports extensive assistance with ADLs s/p injury. Pt would benefit from skilled OT services in the outpatient setting to work on impairments as noted above to help pt return to PLOF as able.  Barriers to meeting goals include severity/chronicity of deficits . Pt to be seen 1-2 x week for 8 weeks.    PERFORMANCE DEFICITS: in functional skills including ADLs, IADLs, coordination, dexterity, sensation, tone, ROM, strength, pain, fascial restrictions, muscle spasms, flexibility, Fine motor control, Gross motor control, mobility, balance, body mechanics, endurance, continence, skin integrity, and UE functional use,   IMPAIRMENTS: are limiting patient from ADLs, IADLs, leisure, and social participation.   CO-MORBIDITIES: has co-morbidities such as chronic pain, paresthesia of Ues, neurogenic bladder  that affects occupational performance. Patient will benefit from skilled OT to address above impairments and improve overall function.  MODIFICATION OR ASSISTANCE TO COMPLETE EVALUATION: No modification of tasks or assist necessary to complete an evaluation.  OT OCCUPATIONAL PROFILE AND HISTORY: Problem focused assessment: Including review of records relating to presenting problem.  CLINICAL DECISION MAKING: LOW - limited treatment options, no task modification necessary  REHAB POTENTIAL: Fair chronicity of SCI  EVALUATION COMPLEXITY: Low    PLAN:  OT FREQUENCY: 1x/week  OT  DURATION: 8 weeks  PLANNED INTERVENTIONS: self care/ADL training, therapeutic exercise, therapeutic activity, neuromuscular re-education, balance training, functional mobility training, splinting, electrical stimulation, patient/family education, coping strategies training, and DME and/or AE instructions  RECOMMENDED OTHER SERVICES: NA  CONSULTED AND AGREED WITH PLAN OF CARE: Patient  PLAN FOR NEXT SESSION:  ADL transfer assessment for balance assessment Hand control consideration (need pictures of vehicle or access to vehicle) AE needs for kitchen etc Assess mobility in the kitchen Mug meal ideas  Victorino Sparrow, OT 07/11/2023, 5:35 PM

## 2023-08-01 ENCOUNTER — Ambulatory Visit: Payer: Medicare Other | Attending: Nurse Practitioner | Admitting: Occupational Therapy

## 2023-08-01 DIAGNOSIS — R29818 Other symptoms and signs involving the nervous system: Secondary | ICD-10-CM | POA: Diagnosis present

## 2023-08-01 DIAGNOSIS — M6281 Muscle weakness (generalized): Secondary | ICD-10-CM | POA: Insufficient documentation

## 2023-08-01 DIAGNOSIS — R278 Other lack of coordination: Secondary | ICD-10-CM | POA: Diagnosis not present

## 2023-08-01 NOTE — Therapy (Signed)
OUTPATIENT OCCUPATIONAL THERAPY NEURO TREATMENT  Patient Name: Curtis Mann MRN: [id number] DOB:1986/01/12, 37 y.o., male Today's Date: 08/01/2023  PCP: Dr. Darleene Cleaver (house calls)  REFERRING PROVIDER: Nechama Guard, FNP  END OF SESSION:  OT End of Session - 08/01/23 1412     Visit Number 2    Number of Visits 8   + evaluation   Date for OT Re-Evaluation 09/22/23    Authorization Type Medicare & Medicaid Covered 100%    Progress Note Due on Visit 10    OT Start Time 1412    OT Stop Time 1445    OT Time Calculation (min) 33 min    Equipment Utilized During Treatment therabar/band    Activity Tolerance Patient tolerated treatment well    Behavior During Therapy WFL for tasks assessed/performed              Past Medical History:  Diagnosis Date   Complication of anesthesia    in 2015 patient states he woke up immediately after surgery , 2016- took him 30 minutes to wake up    Hypotension occasional   Neurogenic bladder    foley and i and o caths   Neurogenic bladder    i and o caths 6-8 times day   Neurogenic bowel    bowel program with digital stimulation as needed   Neuromuscular disorder (HCC)    Quadriplegic uses electric wheelchair, [employer] or Two person Transfer   Quadriplegic spinal paralysis (HCC) 2009   Speech disturbance    Past Surgical History:  Procedure Laterality Date   bicep and tricept tendon transfers bilateral  2011   bladder botox surgery  2012   BOTOX INJECTION N/A 03/06/2018   Procedure: BOTOX INJECTION CYSTOSCOPY WITH FULGURATION;  Surgeon: Alfredo Martinez, MD;  Location: [facility];  Service: Urology;  Laterality: N/A;   BOTOX INJECTION N/A 05/14/2019   Procedure: CYSTOSCOPY BOTOX INJECTION;  Surgeon: Alfredo Martinez, MD;  Location: [facility];  Service: Urology;  Laterality: N/A;   COLONOSCOPY WITH PROPOFOL N/A 03/12/2021   Procedure: COLONOSCOPY WITH PROPOFOL;  Surgeon: Kathi Der, MD;  Location: [facility];  Service:  Gastroenterology;  Laterality: N/A;   CYSTOSCOPY N/A 01/20/2014   Procedure: CYSTOSCOPY WITH BOTOX INJECTION;  Surgeon: Kathi Ludwig, MD;  Location: [facility];  Service: Urology;  Laterality: N/A;   CYSTOSCOPY N/A 04/09/2015   Procedure: CYSTOSCOPY WITH BOTOX INJECTION;  Surgeon: Jethro Bolus, MD;  Location: [facility];  Service: Urology;  Laterality: N/A;   CYSTOSCOPY WITH INJECTION  10/11/2012   Procedure: CYSTOSCOPY WITH INJECTION;  Surgeon: Kathi Ludwig, MD;  Location: [facility];  Service: Urology;  Laterality: N/A;  botox   CYSTOSCOPY WITH INJECTION N/A 04/29/2016   Procedure: CYSTOSCOPY WITH INJECTION;  Surgeon: Jethro Bolus, MD;  Location: [facility];  Service: Urology;  Laterality: N/A;   surgery for spinal cord injury C 4 to C 6  2009   VASECTOMY Bilateral 05/14/2019   Procedure: VASECTOMY;  Surgeon: Alfredo Martinez, MD;  Location: [facility];  Service: Urology;  Laterality: Bilateral;   Patient Active Problem List   Diagnosis Date Noted   Hematochezia 02/04/2021   Hemorrhoids 02/04/2021   Left lower quadrant pain 02/04/2021   Rectal bleeding 02/04/2021   Rectal pain 02/04/2021   Urethral stricture due to infection 06/11/2020   Chronic pain syndrome 05/21/2020   Fall from, out of or through balcony, sequela 05/21/2020   Malnutrition of mild degree Lily Kocher: 75% to less than 90% of standard weight) (  HCC) 05/21/2020   Neurogenic bowel 05/21/2020   Polyp of colon 05/21/2020   Urinary incontinence 05/21/2020   Wheelchair dependence 05/21/2020   Acute pyelonephritis 11/28/2019   Pyelonephritis 11/28/2019   Urinary tract infection associated with catheterization of urinary tract, initial encounter (HCC) 11/27/2019   Neurogenic bladder    Hypotension    UTI (urinary tract infection) 05/28/2018   Leukocytosis 12/13/2016   Community acquired pneumonia of right lower lobe of lung    Quadriplegia (HCC)    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  Patient reports that he previously drove an adapted [place] but has not been driving lately.  His sister has gotten access to a [employer] with hand controls but the setup is different than what he drove previously and although he will likely need assistance with the transfers he would like to work on the motor control need to possibly manage the hand controls so he can possibly enjoy the freedom of driving again. In addition, he previously used 'Active hands' grip/gloves to help him hold things ie) rake, bat etc. And he would like to explore options to help him with activities from his WC.  Patient presented in a power WC today which he reports is "new" to him as of < 1 week.   Pt accompanied by: self  PERTINENT HISTORY:   PMHx of chronic pain syndrome, hypotension, incontinence, tetraplegia.  Patient reports a bicep/tricep tendon transfer surgery to give him more control of arm extension.  PRECAUTIONS: Fall and Other: Skin integrity  WEIGHT BEARING RESTRICTIONS:  Unable  PAIN:  Are you having pain? Yes: NPRS scale: 3/10 Pain location: lower back, shoulders, elbows and forearms Pain description: achy pain Aggravating factors: new WC <1 month has corrected posture and contributed to changes but is adapting better Relieving factors: stretching and loosening muscles LB pain is as constant since receiving new WC.  Shoulders, elbows and forearms is regular chronic pian "Adapting to new WC"  FALLS: Has patient fallen in last 6 months? 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  PATIENT GOALS: Increased strength and coordination for increased use of his hands for daily activities and potential driving.  He uses his mouth for various activities and his children are starting to copy him biting things.  OBJECTIVE:   HAND DOMINANCE: Right - writing and driving but Left hand is stronger to hold objects.  ADLs: Overall ADLs: Wife is his aide at home (paid position) Transfers/ambulation related to ADLs: Sliding board or squat pivot with extensive assistance from caregiver Eating: spouse cuts steak etc Grooming: can use electric razor himself UB Dressing: Can help move his arms etc during dressing LB Dressing: Completed in bed Toileting: Digital stimulation by caregiver every other day for BM.  He can self cath but  has had an indwelling cath x 3 weeks - coming off today or tomorrow) Bathing: Assisted by spouse Tub Shower transfers: Squat pivot to the shower chair Equipment: Shower seat with back, Walk in shower, Reacher, Feeding equipment, and U-cuff  IADLs: Shopping: Spouse Light housekeeping: Spouse Meal Prep: Spouse Community mobility: Para transit ([employer] [place]) power WC Medication management: wife gets medicine for him and he takes  Landscape architect: Online - does  Handwriting:  NT  MOBILITY STATUS:  power WC  POSTURE COMMENTS:  rounded shoulders, forward head, decreased lumbar lordosis, increased thoracic kyphosis, and flexed trunk  Sitting balance:  Needs assistance.  Time constraints of eval did not allow for out of WC assessment - will explore further  ACTIVITY TOLERANCE: Activity tolerance:  Limited   FUNCTIONAL OUTCOME MEASURES: Modified Barthel Index: Self Care Assessment - 21/100 Chair/Bed Transfers: 3/15 Ambulation: na/15 OR WC Mobility: 5/5 Stair Climbing: 0/10 Toilet Transfers: 2/10 Bowel Control: 2/10 Bladder Control: 0/10 Bathing: 1/5 Dressing: 2/10 Personal Hygiene: 1/5 Feeding: 5/10  Interpretation: 0-20 - Total Dependence 21-60 - Severe Dependence 61-90 - Moderate Dependence 91-99 - Slight Dependence 100 - Independence  UPPER EXTREMITY ROM:    Active ROM Right eval Left eval  Shoulder flexion 90 90  Shoulder abduction 90 90  Shoulder adduction    Shoulder extension    Shoulder internal rotation    Shoulder external rotation    Elbow flexion [facility] WFL  Elbow extension Decreased end range   Wrist flexion    Wrist extension    Wrist ulnar deviation    Wrist radial deviation    Wrist pronation    Wrist supination    (Blank rows = not tested)  UPPER EXTREMITY MMT:     MMT Right eval Left eval  Shoulder flexion 3-/5 3-/5  Shoulder abduction 3-/5 3-/5  Shoulder adduction    Shoulder extension    Shoulder internal rotation    Shoulder external rotation    Middle trapezius    Lower trapezius    Elbow flexion 3/5 3/5  Elbow extension 3-/5   Wrist flexion N/a  against gravity  Wrist extension    Wrist ulnar deviation    Wrist radial deviation    Wrist pronation    Wrist supination    (Blank rows = not tested)  Patient has no active finger extension - digital position dependent on tenodesis  HAND FUNCTION:  Grip strength: unable to squeeze with wrist neutral with wrist extended he was able to squeeze enough to activate dynamometer - Right: 1.3 lbs, Left: 2.2 lbs  COORDINATION: NT  SENSATION: Light touch: Impaired  and Paraesthesia UE  EDEMA: NA  MUSCLE TONE: RUE: Moderate and Hypertonic and LUE: Moderate and Hypertonic  COGNITION: Overall cognitive status: Within functional limits for tasks assessed  VISION: Baseline  vision: No visual deficits  VISION ASSESSMENT: Not tested  PERCEPTION: Not tested  PRAXIS: Not tested  OBSERVATIONS: Pt arrived in his new power WC with R hand control and access to his Quadtool - reacher across his lap.  Pt has decreased lumbar lordosis and obvious B tenodesis grip with the wrists extended and PIP/DIP flexion of digits.   TODAY'S TREATMENT:  DATE: NA   PATIENT EDUCATION: Education details: OT role and POC considerations Person educated: Patient Education method: Explanation Education comprehension: verbalized understanding  HOME EXERCISE PROGRAM: TBD   GOALS: Goals reviewed with patient? Yes  SHORT TERM GOALS: Target date: 08/11/23  Patient will demonstrate UE HEP with 25% verbal cues or less for proper execution.  Baseline: New to outpatient OT  Goal status: INITIAL  Patient will be able to sit EOB with SBA x 5 minutes to assist with increased ease with sliding board transfers. Baseline: TBD Goal status: INITIAL  3.  Patient will be aware of AE/modified setup/techniques to get things in/out or fridge/cabinet etc. Baseline: Relies on spouse Goal status: INITIAL  4.  Patient will be aware of AE/modified setup/techniques to minimize use of mouth/increase use of hands for daily activities  Baseline: Self reported concerns re: children copying him "biting" things and re: sanitariness of using his mouth. Goal status: INITIAL   LONG TERM GOALS: Target date: 09/08/23  Patient will be able to perform UE motions necessary for driving with hand controls.  Baseline: Access to vehicle with hand controls (different than previous controls he used) Goal status: INITIAL  2.  Patient will have resources needed for modifications or training for driving with SCI  Baseline: Uses public transportation.  Goal status: INITIAL  3.  Patient will  be aware of AE and modified techniques to increase ability to make different foods safely with proper setup, access to equipment etc Baseline: Relies on spouse Goal status: INITIAL  ASSESSMENT:  CLINICAL IMPRESSION: Pt is 37 year old previously R hand dominant male who presents for occupational therapy eval secondary to SCI with functional quadriplegia as all limbs are affected. Hx includes no recent OT services. Please refer above for additional medical history. Pt reports extensive assistance with ADLs s/p injury. Pt would benefit from skilled OT services in the outpatient setting to work on impairments as noted above to help pt return to PLOF as able.  Barriers to meeting goals include severity/chronicity of deficits . Pt to be seen 1-2 x week for 8 weeks.    PERFORMANCE DEFICITS: in functional skills including ADLs, IADLs, coordination, dexterity, sensation, tone, ROM, strength, pain, fascial restrictions, muscle spasms, flexibility, Fine motor control, Gross motor control, mobility, balance, body mechanics, endurance, continence, skin integrity, and UE functional use,   IMPAIRMENTS: are limiting patient from ADLs, IADLs, leisure, and social participation.   CO-MORBIDITIES: has co-morbidities such as chronic pain, paresthesia of [place], neurogenic bladder  that affects occupational performance. Patient will benefit from skilled OT to address above impairments and improve overall function.  MODIFICATION OR ASSISTANCE TO COMPLETE EVALUATION: No modification of tasks or assist necessary to complete an evaluation.  OT OCCUPATIONAL PROFILE AND HISTORY: Problem focused assessment: Including review of records relating to presenting problem.  CLINICAL DECISION MAKING: LOW - limited treatment options, no task modification necessary  REHAB POTENTIAL: Fair chronicity of SCI  EVALUATION COMPLEXITY: Low    PLAN:  OT FREQUENCY: 1x/week  OT DURATION: 8 weeks  PLANNED INTERVENTIONS: self care/ADL  training, therapeutic exercise, therapeutic activity, neuromuscular re-education, balance training, functional mobility training, splinting, electrical stimulation, patient/family education, coping strategies training, and DME and/or AE instructions  RECOMMENDED OTHER SERVICES: NA  CONSULTED AND AGREED WITH PLAN OF CARE: Patient  PLAN FOR NEXT SESSION:  ADL transfer assessment for balance assessment Hand control consideration (need pictures of vehicle or access to vehicle) AE needs for kitchen etc Assess mobility in the kitchen Mug meal ideas  Victorino Sparrow, OT 08/01/2023, 4:41 PM

## 2023-08-08 ENCOUNTER — Encounter: Payer: Medicare Other | Admitting: Occupational Therapy

## 2023-08-10 ENCOUNTER — Ambulatory Visit: Payer: Medicare Other | Admitting: Occupational Therapy

## 2023-08-10 DIAGNOSIS — M6281 Muscle weakness (generalized): Secondary | ICD-10-CM

## 2023-08-10 DIAGNOSIS — R278 Other lack of coordination: Secondary | ICD-10-CM | POA: Diagnosis not present

## 2023-08-10 DIAGNOSIS — R29818 Other symptoms and signs involving the nervous system: Secondary | ICD-10-CM

## 2023-08-10 NOTE — Therapy (Signed)
OUTPATIENT OCCUPATIONAL THERAPY NEURO TREATMENT  Patient Name: Curtis Mann MRN: [id number] DOB:02/06/1986, 37 y.o., male Today's Date: 08/10/2023  PCP: Dr. Darleene Cleaver (house calls)  REFERRING PROVIDER: Nechama Guard, FNP  END OF SESSION:  OT End of Session - 08/10/23 0759     Visit Number 3    Number of Visits 8   + evaluation   Date for OT Re-Evaluation 09/22/23    Authorization Type Medicare & Medicaid Covered 100%    Progress Note Due on Visit 10    OT Start Time 0800    OT Stop Time 0845    OT Time Calculation (min) 45 min    Activity Tolerance Patient tolerated treatment well    Behavior During Therapy [facility] for tasks assessed/performed              Past Medical History:  Diagnosis Date   Complication of anesthesia    in 2015 patient states he woke up immediately after surgery , 2016- took him 30 minutes to wake up    Hypotension occasional   Neurogenic bladder    foley and i and o caths   Neurogenic bladder    i and o caths 6-8 times day   Neurogenic bowel    bowel program with digital stimulation as needed   Neuromuscular disorder (HCC)    Quadriplegic uses electric wheelchair, [employer] or Two person Transfer   Quadriplegic spinal paralysis (HCC) 2009   Speech disturbance    Past Surgical History:  Procedure Laterality Date   bicep and tricept tendon transfers bilateral  2011   bladder botox surgery  2012   BOTOX INJECTION N/A 03/06/2018   Procedure: BOTOX INJECTION CYSTOSCOPY WITH FULGURATION;  Surgeon: Alfredo Martinez, MD;  Location: [facility];  Service: Urology;  Laterality: N/A;   BOTOX INJECTION N/A 05/14/2019   Procedure: CYSTOSCOPY BOTOX INJECTION;  Surgeon: Alfredo Martinez, MD;  Location: [facility];  Service: Urology;  Laterality: N/A;   COLONOSCOPY WITH PROPOFOL N/A 03/12/2021   Procedure: COLONOSCOPY WITH PROPOFOL;  Surgeon: Kathi Der, MD;  Location: [facility];  Service: Gastroenterology;  Laterality: N/A;   CYSTOSCOPY N/A 01/20/2014    Procedure: CYSTOSCOPY WITH BOTOX INJECTION;  Surgeon: Kathi Ludwig, MD;  Location: [facility];  Service: Urology;  Laterality: N/A;   CYSTOSCOPY N/A 04/09/2015   Procedure: CYSTOSCOPY WITH BOTOX INJECTION;  Surgeon: Jethro Bolus, MD;  Location: [facility];  Service: Urology;  Laterality: N/A;   CYSTOSCOPY WITH INJECTION  10/11/2012   Procedure: CYSTOSCOPY WITH INJECTION;  Surgeon: Kathi Ludwig, MD;  Location: [facility];  Service: Urology;  Laterality: N/A;  botox   CYSTOSCOPY WITH INJECTION N/A 04/29/2016   Procedure: CYSTOSCOPY WITH INJECTION;  Surgeon: Jethro Bolus, MD;  Location: [facility];  Service: Urology;  Laterality: N/A;   surgery for spinal cord injury C 4 to C 6  2009   VASECTOMY Bilateral 05/14/2019   Procedure: VASECTOMY;  Surgeon: Alfredo Martinez, MD;  Location: [facility];  Service: Urology;  Laterality: Bilateral;   Patient Active Problem List   Diagnosis Date Noted   Hematochezia 02/04/2021   Hemorrhoids 02/04/2021   Left lower quadrant pain 02/04/2021   Rectal bleeding 02/04/2021   Rectal pain 02/04/2021   Urethral stricture due to infection 06/11/2020   Chronic pain syndrome 05/21/2020   Fall from, out of or through balcony, sequela 05/21/2020   Malnutrition of mild degree Lily Kocher: 75% to less than 90% of standard weight) (HCC) 05/21/2020   Neurogenic bowel 05/21/2020  Polyp of colon 05/21/2020   Urinary incontinence 05/21/2020   Wheelchair dependence 05/21/2020   Acute pyelonephritis 11/28/2019   Pyelonephritis 11/28/2019   Urinary tract infection associated with catheterization of urinary tract, initial encounter (HCC) 11/27/2019   Neurogenic bladder    Hypotension    UTI (urinary tract infection) 05/28/2018   Leukocytosis 12/13/2016   Community acquired pneumonia of right lower lobe of lung    Quadriplegia (HCC)    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  Rationale for Evaluation and Treatment: Rehabilitation  SUBJECTIVE:   SUBJECTIVE STATEMENT:  Patient presented in his power WC today which was "new" to him at time of evaluation and reports he is more comfortable without the previous discomfort he was noting.  He also reports that he worked out at [employer] twice this week with plans to go this evening.  He found his Active hand device and is able to get it on before he gets to the gym to save time now.  Patient reported being concerned about EOB activities today due to his stomach not feeling great this morning.  Pt accompanied by: self  PERTINENT HISTORY:   PMHx of chronic pain syndrome, hypotension, incontinence, tetraplegia.  Patient reports a bicep/tricep tendon transfer surgery to give him more control of arm extension.  PRECAUTIONS: Fall and Other: Skin integrity  WEIGHT BEARING RESTRICTIONS:  Unable  PAIN:  Are you having pain? Yes: NPRS scale: 3/10 Pain location: lower back, shoulders, elbows and forearms Pain description: achy pain Aggravating factors: newer WC but is adapting better Relieving factors: stretching and loosening muscles LB pain is as constant since receiving new WC.  Shoulders, elbows and forearms is regular chronic pian "Adapting to new WC"  FALLS: Has patient fallen in last 6 months? 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  PATIENT GOALS: Increased strength and coordination for increased use of  his hands for daily activities and potential driving.  He uses his mouth for various activities and his children are starting to copy him biting things.  OBJECTIVE:   HAND DOMINANCE: Right - writing and driving but Left hand is stronger to hold objects.  ADLs: Overall ADLs: Wife is his aide at home (paid position) Transfers/ambulation related to ADLs: Sliding board or squat pivot with extensive assistance from caregiver Eating: spouse cuts steak etc Grooming: can use electric razor himself UB Dressing: Can help move his arms etc during dressing LB Dressing: Completed in bed Toileting: Digital stimulation by caregiver every other day for BM.  He can self cath but has had an indwelling cath x 3 weeks - coming off today or tomorrow) Bathing: Assisted by spouse Tub Shower transfers: Squat pivot to the shower chair Equipment: Shower seat with back, Walk in shower, Reacher, Feeding equipment, and U-cuff  IADLs: Shopping: Spouse Light housekeeping: Spouse Meal Prep: Spouse Community mobility: Para transit ([employer] [place]) power WC Medication management: wife gets medicine for him and he takes  Surveyor, quantity  management: Online - does  Handwriting:  NT  MOBILITY STATUS:  power WC  POSTURE COMMENTS:  rounded shoulders, forward head, decreased lumbar lordosis, increased thoracic kyphosis, and flexed trunk  Sitting balance:  Needs assistance.  Time constraints of eval did not allow for out of WC assessment - will explore further  ACTIVITY TOLERANCE: Activity tolerance: Limited   FUNCTIONAL OUTCOME MEASURES: Modified Barthel Index: Self Care Assessment - 21/100 Chair/Bed Transfers: 3/15 Ambulation: na/15 OR WC Mobility: 5/5 Stair Climbing: 0/10 Toilet Transfers: 2/10 Bowel Control: 2/10 Bladder Control: 0/10 Bathing: 1/5 Dressing: 2/10 Personal Hygiene: 1/5 Feeding: 5/10  Interpretation: 0-20 - Total Dependence 21-60 - Severe Dependence 61-90 - Moderate Dependence 91-99 - Slight  Dependence 100 - Independence  UPPER EXTREMITY ROM:    Active ROM Right eval Left eval  Shoulder flexion 90 90  Shoulder abduction 90 90  Shoulder adduction    Shoulder extension    Shoulder internal rotation    Shoulder external rotation    Elbow flexion [facility] WFL  Elbow extension Decreased end range   Wrist flexion    Wrist extension    Wrist ulnar deviation    Wrist radial deviation    Wrist pronation    Wrist supination    (Blank rows = not tested)  UPPER EXTREMITY MMT:     MMT Right eval Left eval  Shoulder flexion 3-/5 3-/5  Shoulder abduction 3-/5 3-/5  Shoulder adduction    Shoulder extension    Shoulder internal rotation    Shoulder external rotation    Middle trapezius    Lower trapezius    Elbow flexion 3/5 3/5  Elbow extension 3-/5   Wrist flexion N/a  against gravity  Wrist extension    Wrist ulnar deviation    Wrist radial deviation    Wrist pronation    Wrist supination    (Blank rows = not tested)  Patient has no active finger extension - digital position dependent on tenodesis  HAND FUNCTION:  Grip strength: unable to squeeze with wrist neutral with wrist extended he was able to squeeze enough to activate dynamometer - Right: 1.3 lbs, Left: 2.2 lbs  COORDINATION: NT  SENSATION: Light touch: Impaired  and Paraesthesia UE  EDEMA: NA  MUSCLE TONE: RUE: Moderate and Hypertonic and LUE: Moderate and Hypertonic  COGNITION: Overall cognitive status: Within functional limits for tasks assessed  VISION: Baseline vision: No visual deficits  VISION ASSESSMENT: Not tested  PERCEPTION: Not tested  PRAXIS: Not tested  OBSERVATIONS: Pt arrived in his new power WC with R hand control and access to his Quadtool - reacher across his lap.  Pt has decreased lumbar lordosis and obvious B tenodesis grip with the wrists extended and PIP/DIP flexion of digits.   TODAY'S TREATMENT:  Self Care:   Patient transfer to mat table deferred today due to patient concern re: GI issues with movement today.   Used training in the kitchen and web search to help with awareness of adaptive equipment for daily function.  Patient wants to be able to get food out of the fridge, prepare food items and help wash dishes.  Pictures shown via online/Amazon search and list made for patient to research at home and consult with spouse.  Patient able to use rocker knife with vertical handle in his L hand to cut putty today.  Patient may benefit form a knife cover to stabilize his hand on with possible option to make a slip on thermoplastic cover to help with this.  Also provided info re: right handle knife.  Patient encouraged to have his wife put milk in disposable water bottles which he uses daily and is able to manage the lid on.  He has difficulty with opening tupperware containers and OTR suggested plastic reusable bowl covers (like a shower cap with elastic edge to cover containers).  Information provided re: Mini Crock pot with patient encouraged to look up Mug Meals, air fryer food and even crock pot meals for consideration of cooking, although he does not have microwave at home.  Options for washing dishes suggested include bottle washer and dishwands that can be filled with dishwashing soap for increased ease of use.    PATIENT EDUCATION: Education details: AE considerations for kitchen Person educated: Patient Education method: Explanation, Demonstration, and Verbal cues Education comprehension: verbalized understanding, returned demonstration, verbal cues required, and needs further education  HOME EXERCISE PROGRAM: TBD   GOALS: Goals reviewed with patient? Yes  SHORT TERM GOALS: Target date: 08/11/23  Patient will demonstrate UE HEP with 25% verbal cues or less for proper execution.  Baseline: New to outpatient  OT  Goal status: IN Progress  Patient will be able to sit EOB with SBA x 5 minutes to assist with increased ease with sliding board transfers. Baseline: TBD Goal status: INITIAL  3.  Patient will be aware of AE/modified setup/techniques to get things in/out or fridge/cabinet etc. Baseline: Relies on spouse Goal status: IN Progress  4.  Patient will be aware of AE/modified setup/techniques to minimize use of mouth/increase use of hands for daily activities  Baseline: Self reported concerns re: children copying him "biting" things and re: sanitariness of using his mouth. Goal status: IN Progress   LONG TERM GOALS: Target date: 09/08/23  Patient will be able to perform UE motions necessary for driving with hand controls.  Baseline: Access to vehicle with hand controls (different than previous controls he used) Goal status: IN Progress  2.  Patient will have resources needed for modifications or training for driving with SCI  Baseline: Uses public transportation.  Goal status: IN Progress  3.  Patient will be aware of AE and modified techniques to increase ability to make different foods safely with proper setup, access to equipment etc Baseline: Relies on spouse Goal status: INITIAL  ASSESSMENT:  CLINICAL IMPRESSION: Pt is 36 year old for occupational therapy treatment secondary to SCI with functional quadriplegia as all limbs are affected. Pt responded well to adaptive equipment/modified technique considerations for functional kitchen/daily tasks as presented today for UE modified use.  He will benefit from continued skilled OT services in the outpatient setting to work on impairments.    PERFORMANCE DEFICITS: in functional skills including ADLs, IADLs, coordination, dexterity, sensation, tone, ROM, strength, pain, fascial restrictions, muscle  spasms, flexibility, Fine motor control, Gross motor control, mobility, balance, body mechanics, endurance, continence, skin integrity, and UE  functional use,   IMPAIRMENTS: are limiting patient from ADLs, IADLs, leisure, and social participation.   CO-MORBIDITIES: has co-morbidities such as chronic pain, paresthesia of Ues, neurogenic bladder  that affects occupational performance. Patient will benefit from skilled OT to address above impairments and improve overall function.  REHAB POTENTIAL: Fair chronicity of SCI   PLAN:  OT FREQUENCY: 1x/week  OT DURATION: 8 weeks  PLANNED INTERVENTIONS: self care/ADL training, therapeutic exercise, therapeutic activity, neuromuscular re-education, balance training, functional mobility training, splinting, electrical stimulation, patient/family education, coping strategies training, and DME and/or AE instructions  RECOMMENDED OTHER SERVICES: NA  CONSULTED AND AGREED WITH PLAN OF CARE: Patient  PLAN FOR NEXT SESSION:  ADL transfer assessment for balance assessment Further exploration of Hand control consideration (need pictures of vehicle or access to vehicle) AE needs for kitchen etc - WATER BOTTLE Opener (for decreased need to use mouth) Continued Assess mobility in the kitchen Mug meal & crock pt ideas   Victorino Sparrow, OT 08/10/2023, 8:00 AM

## 2023-08-15 ENCOUNTER — Encounter: Payer: Medicare Other | Admitting: Occupational Therapy

## 2023-08-17 ENCOUNTER — Ambulatory Visit: Payer: Medicare Other | Admitting: Occupational Therapy

## 2023-08-18 ENCOUNTER — Encounter (HOSPITAL_COMMUNITY): Payer: Self-pay

## 2023-08-18 ENCOUNTER — Other Ambulatory Visit: Payer: Self-pay

## 2023-08-18 ENCOUNTER — Emergency Department (HOSPITAL_COMMUNITY): Payer: Medicare Other

## 2023-08-18 ENCOUNTER — Emergency Department (HOSPITAL_COMMUNITY)
Admission: EM | Admit: 2023-08-18 | Discharge: 2023-08-18 | Disposition: A | Discharge status: Home / Self Care | Payer: Medicare Other | Attending: Emergency Medicine | Admitting: Emergency Medicine

## 2023-08-18 DIAGNOSIS — L03012 Cellulitis of left finger: Secondary | ICD-10-CM | POA: Insufficient documentation

## 2023-08-18 DIAGNOSIS — Z23 Encounter for immunization: Secondary | ICD-10-CM | POA: Diagnosis not present

## 2023-08-18 DIAGNOSIS — R2232 Localized swelling, mass and lump, left upper limb: Secondary | ICD-10-CM | POA: Diagnosis present

## 2023-08-18 MED ORDER — TETANUS-DIPHTH-ACELL PERTUSSIS 5-2.5-18.5 LF-MCG/0.5 IM SUSY
0.5000 mL | PREFILLED_SYRINGE | Freq: Once | INTRAMUSCULAR | Status: AC
  Administered 2023-08-18: 0.5 mL via INTRAMUSCULAR
  Filled 2023-08-18: qty 0.5

## 2023-08-18 MED ORDER — CHLORHEXIDINE GLUCONATE 4 % EX SOLN: Freq: Every day | CUTANEOUS | 0 refills | Status: AC | PRN

## 2023-08-18 MED ORDER — DOXYCYCLINE HYCLATE 100 MG PO TABS
100.0000 mg | ORAL_TABLET | Freq: Once | ORAL | Status: AC
  Administered 2023-08-18: 100 mg via ORAL
  Filled 2023-08-18: qty 1

## 2023-08-18 MED ORDER — LIDOCAINE-EPINEPHRINE-TETRACAINE (LET) TOPICAL GEL
3.0000 mL | Freq: Once | TOPICAL | Status: AC
  Administered 2023-08-18: 3 mL via TOPICAL
  Filled 2023-08-18: qty 3

## 2023-08-18 MED ORDER — DOXYCYCLINE HYCLATE 100 MG PO CAPS: 100.0000 mg | ORAL_CAPSULE | Freq: Two times a day (BID) | ORAL | 0 refills | Status: AC

## 2023-08-18 NOTE — ED Provider Notes (Signed)
[facility]ERGENCY DEPARTMENT AT [facility] Provider Note   CSN: [id number] Arrival date & time: 08/18/23  8469    History  Chief Complaint  Patient presents with   swollen finger    Curtis Mann is a 37 y.o. male history of spastic quadriplegia, neurogenic bladder, self caths here for evaluation of pain and swelling to his left thumb.  Noted yesterday evening.  He denies any known injury however states he was lifting weights yesterday and he is unsure if he injured his thumb.  He has swelling to his lateral nail fold of the lateral aspect nail.  He does chew his fingernails and cuticles.  Unknown last tetanus.  Area is red, painful to the touch.  Does not radiate.  No paresthesias, fever, nausea or vomiting.  Has never had anything like this previously.    HPI     Home Medications Prior to Admission medications   Medication Sig Start Date End Date Taking? Authorizing Provider  chlorhexidine (HIBICLENS) 4 % external liquid Apply topically daily as needed. 08/18/23  Yes Keveon Amsler A, PA-C  doxycycline (VIBRAMYCIN) 100 MG capsule Take 1 capsule (100 mg total) by mouth 2 (two) times daily. 08/18/23  Yes Marsheila Alejo A, PA-C  baclofen (LIORESAL) 20 MG tablet Take 20 mg by mouth 3 (three) times daily.    [provider]  CRANBERRY PO Take 2 tablets by mouth daily.    [provider]  HYDROcodone-acetaminophen (NORCO) 5-325 MG tablet Take 1-2 tablets by mouth every 6 (six) hours as needed for moderate pain. Patient taking differently: Take 1 tablet by mouth 4 (four) times daily as needed for moderate pain. 05/14/19   Alfredo Martinez, MD  mirabegron ER (MYRBETRIQ) 50 MG TB24 tablet Take 1 tablet by mouth daily. 05/21/20   [provider]  Multiple Vitamin (MULTIVITAMIN WITH MINERALS) TABS tablet Take 1 tablet by mouth daily.    [provider]  oxybutynin (DITROPAN XL) 15 MG 24 hr tablet Take 2 tablets by mouth daily. 08/25/21   [provider]  triamcinolone ointment (KENALOG) 0.1 % Apply 1 application topically daily as needed for dry skin. Left hand & right elbow Patient not taking: Reported on 07/11/2023 02/01/21   [provider]      Allergies    Amoxicillin, Bactrim [sulfamethoxazole-trimethoprim], Other, Penicillins, and Sulfa antibiotics    Review of Systems   Review of Systems  Constitutional: Negative.   HENT: Negative.    Respiratory: Negative.    Cardiovascular: Negative.   Gastrointestinal: Negative.   Genitourinary: Negative.   Musculoskeletal:        Left thumb pain  Skin:  Positive for color change.  Neurological: Negative.   All other systems reviewed and are negative.   Physical Exam Updated Vital Signs BP 92/61   Pulse 64   Temp (!) 97.5 F (36.4 C) (Oral)   Resp 16   Ht 6' (1.829 m)   Wt 61.2 kg   SpO2 100%   BMI 18.30 kg/m  Physical Exam Vitals and nursing note reviewed.  Constitutional:      General: He is not in acute distress.    Appearance: He is well-developed. He is not ill-appearing or diaphoretic.  HENT:     Head: Atraumatic.  Eyes:     Pupils: Pupils are equal, round, and reactive to light.  Cardiovascular:     Rate and Rhythm: Normal rate and regular rhythm.     Pulses: Normal pulses.  Radial pulses are 2+ on the right side and 2+ on the left side.  Pulmonary:     Effort: Pulmonary effort is normal. No respiratory distress.  Abdominal:     General: There is no distension.     Palpations: Abdomen is soft.  Musculoskeletal:        General: Normal range of motion.     Cervical back: Normal range of motion and neck supple.     Comments: No bony tenderness of forearm, wrist, hand.  Contracted hands bilaterally.  He has some tenderness, fullness and erythema to the lateral aspect nail fold extending to the lateral aspect nail however not to midline on the pulp surface.  No induration.  Does not extend to the joint space.  Skin:    General: Skin  is warm and dry.     Capillary Refill: Capillary refill takes less than 2 seconds.     Comments: See picture in chart  Neurological:     General: No focal deficit present.     Mental Status: He is alert and oriented to person, place, and time.     ED Results / Procedures / Treatments   Labs (all labs ordered are listed, but only abnormal results are displayed) Labs Reviewed - No data to display  EKG None  Radiology DG Hand Complete Left  Result Date: 08/18/2023 CLINICAL DATA:  swelling, thumb. EXAM: LEFT HAND - COMPLETE 3+ VIEW COMPARISON:  None Available. FINDINGS: No acute fracture. No aggressive osseous lesion. On all the provided three views, there is overlapping of the phalanges of the thumb, which is most likely secondary to projection and not because of the dislocation. Correlate with physical examination. No significant arthritis. No radiopaque foreign bodies. Soft tissues are within normal limits. IMPRESSION: 1. No acute fracture or aggressive osseous lesion. 2. Overlapping of the phalanges of the thumb, most likely secondary to projection and not because of the dislocation. Correlate with physical examination. Electronically Signed   By: Jules Schick M.D.   On: 08/18/2023 10:10    Procedures .Marland KitchenIncision and Drainage  Date/Time: 08/18/2023 10:28 AM  Performed by: Linwood Dibbles, PA-C Authorized by: Linwood Dibbles, PA-C   Consent:    Consent obtained:  Verbal   Consent given by:  Patient   Risks discussed:  Bleeding, incomplete drainage, pain, damage to other organs and infection   Alternatives discussed:  No treatment, delayed treatment, alternative treatment, observation and referral Universal protocol:    Procedure explained and questions answered to patient or proxy's satisfaction: yes     Relevant documents present and verified: yes     Test results available : yes     Imaging studies available: yes     Required blood products, implants, devices, and special  equipment available: yes     Site/side marked: yes     Immediately prior to procedure, a time out was called: yes     Patient identity confirmed:  Verbally with patient Location:    Type:  Abscess   Size:  1cm   Location:  Upper extremity   Upper extremity location:  Finger   Finger location:  L thumb Pre-procedure details:    Skin preparation:  Betadine Anesthesia:    Anesthesia method:  Topical application   Topical anesthetic:  LET Procedure type:    Complexity:  Complex Procedure details:    Incision types:  Single straight   Incision depth:  Subcutaneous   Wound management:  Probed and deloculated and extensive  cleaning   Drainage:  Purulent   Drainage amount:  Moderate   Wound treatment:  Wound left open   Packing materials:  None Post-procedure details:    Procedure completion:  Tolerated well, no immediate complications     Medications Ordered in ED Medications  doxycycline (VIBRA-TABS) tablet 100 mg (100 mg Oral Given 08/18/23 0942)  Tdap (BOOSTRIX) injection 0.5 mL (0.5 mLs Intramuscular Given 08/18/23 0942)  lidocaine-EPINEPHrine-tetracaine (LET) topical gel (3 mLs Topical Given 08/18/23 0941)    ED Course/ Medical Decision Making/ A&P    37 year old complex medical history including spastic quadriparesis here for evaluation of pain and swelling to the left thumb.  He does chew his fingernails as well as his cuticles.  On arrival he is afebrile, nonseptic, not ill-appearing.  Unsure if he injured this area as he states he was lifting weights as physical therapy yesterday.  Will update tetanus.  Also get x-ray given to ensure no bony abnormality.  Appears to have paronychia, possible early developing felon to the lateral nail fold.  Will attempt I&D of paronychia.  Do chlorhexidine rinses twice daily, oral antibiotics and follow-up with hand surgery.  Patient started on doxycycline due to multiple medication allergies.  Allergy to penicillins, cephalosporins,  Bactrim.  Imaging personally viewed and interpreted:  X-ray  IMPRESSION:  1. No acute fracture or aggressive osseous lesion.  2. Overlapping of the phalanges of the thumb, most likely secondary  to projection and not because of the dislocation. Correlate with  physical examination.   Exam not consistent with dislocation.  Suspect overlapping due to position as patient has hand contractures.  See procedure note.  Patient tolerated well.  Purulent drainage expressed from the lateral nail fold.  I do think he is possibly developing a felon the lateral aspect of nail pad do not think he needs drainage of that at this time.  Will proceed with chlorhexidine soaks daily, oral antibiotics and have him follow-up with hand surgery.  He is agreeable.  Of note he does have some low blood pressure, 92/61.  Patient states he has history of similar.  I reviewed his chart from outpatient visits and seems his blood pressure is typically in the 80s systolic, sometimes into the 70s.  He does not appear septic.  I do not feel any labs or imaging at this time.  The patient has been appropriately medically screened and/or stabilized in the ED. I have low suspicion for any other emergent medical condition which would require further screening, evaluation or treatment in the ED or require inpatient management.  Patient is hemodynamically stable and in no acute distress.  Patient able to ambulate in department prior to ED.  Evaluation does not show acute pathology that would require ongoing or additional emergent interventions while in the emergency department or further inpatient treatment.  I have discussed the diagnosis with the patient and answered all questions.  Pain is been managed while in the emergency department and patient has no further complaints prior to discharge.  Patient is comfortable with plan discussed in room and is stable for discharge at this time.  I have discussed strict return precautions for  returning to the emergency department.  Patient was encouraged to follow-up with PCP/specialist refer to at discharge.                                 Medical Decision Making Amount and/or Complexity of Data Reviewed  External Data Reviewed: labs, radiology and notes. Labs:  Decision-making details documented in ED Course. Radiology: ordered and independent interpretation performed. Decision-making details documented in ED Course.  Risk OTC drugs. Prescription drug management.          Final Clinical Impression(s) / ED Diagnoses Final diagnoses:  Paronychia of left thumb    Rx / DC Orders ED Discharge Orders          Ordered    doxycycline (VIBRAMYCIN) 100 MG capsule  2 times daily        08/18/23 1024    chlorhexidine (HIBICLENS) 4 % external liquid  Daily PRN        08/18/23 1024              Jood Retana A, PA-C 08/18/23 1037    Rexford Maus, DO 08/18/23 1530

## 2023-08-18 NOTE — ED Notes (Signed)
Pt is having tremors

## 2023-08-18 NOTE — Discharge Instructions (Addendum)
Use 5 cc of chlorhexidine solution in 20 cc water. Soak finger in solution for 10 min, twice daily.  Take the antibiotics as presribed  Follow up with Hand surgery if not proved by Monday.  There are numbers listed in your discharge paperwork.  Call to schedule an appointment.

## 2023-08-18 NOTE — ED Triage Notes (Signed)
Pt c/o left thumb swelling started last night. Pt denies injury. Pt has 1+ swelling of left thumb. The tip of pt's left thumb is erythematous

## 2023-08-22 ENCOUNTER — Encounter: Payer: Medicare Other | Admitting: Occupational Therapy

## 2023-08-24 ENCOUNTER — Ambulatory Visit: Payer: Medicare Other | Admitting: Occupational Therapy

## 2023-08-29 ENCOUNTER — Encounter: Payer: Medicare Other | Admitting: Occupational Therapy

## 2023-08-31 ENCOUNTER — Ambulatory Visit: Payer: Medicare Other | Attending: Nurse Practitioner | Admitting: Occupational Therapy

## 2023-08-31 DIAGNOSIS — R29898 Other symptoms and signs involving the musculoskeletal system: Secondary | ICD-10-CM | POA: Insufficient documentation

## 2023-08-31 DIAGNOSIS — R278 Other lack of coordination: Secondary | ICD-10-CM | POA: Diagnosis present

## 2023-08-31 DIAGNOSIS — M6281 Muscle weakness (generalized): Secondary | ICD-10-CM | POA: Insufficient documentation

## 2023-08-31 DIAGNOSIS — R29818 Other symptoms and signs involving the nervous system: Secondary | ICD-10-CM | POA: Insufficient documentation

## 2023-08-31 NOTE — Therapy (Signed)
OUTPATIENT OCCUPATIONAL THERAPY NEURO TREATMENT  Patient Name: Curtis Mann MRN: [id number] DOB:07/08/1986, 37 y.o., male Today's Date: 08/31/2023  PCP: Dr. Darleene Cleaver (house calls)  REFERRING PROVIDER: Nechama Guard, FNP  END OF SESSION:  OT End of Session - 08/31/23 0752     Visit Number 4    Number of Visits 8   + evaluation   Date for OT Re-Evaluation 09/22/23    Authorization Type Medicare & Medicaid Covered 100%    Progress Note Due on Visit 10    OT Start Time 0755    OT Stop Time 0845    OT Time Calculation (min) 50 min    Equipment Utilized During Treatment Mat table    Activity Tolerance Patient tolerated treatment well    Behavior During Therapy [facility] for tasks assessed/performed              Past Medical History:  Diagnosis Date   Complication of anesthesia    in 2015 patient states he woke up immediately after surgery , 2016- took him 30 minutes to wake up    Hypotension occasional   Neurogenic bladder    foley and i and o caths   Neurogenic bladder    i and o caths 6-8 times day   Neurogenic bowel    bowel program with digital stimulation as needed   Neuromuscular disorder (HCC)    Quadriplegic uses electric wheelchair, [employer] or Two person Transfer   Quadriplegic spinal paralysis (HCC) 2009   Speech disturbance    Past Surgical History:  Procedure Laterality Date   bicep and tricept tendon transfers bilateral  2011   bladder botox surgery  2012   BOTOX INJECTION N/A 03/06/2018   Procedure: BOTOX INJECTION CYSTOSCOPY WITH FULGURATION;  Surgeon: Alfredo Martinez, MD;  Location: [facility];  Service: Urology;  Laterality: N/A;   BOTOX INJECTION N/A 05/14/2019   Procedure: CYSTOSCOPY BOTOX INJECTION;  Surgeon: Alfredo Martinez, MD;  Location: [facility];  Service: Urology;  Laterality: N/A;   COLONOSCOPY WITH PROPOFOL N/A 03/12/2021   Procedure: COLONOSCOPY WITH PROPOFOL;  Surgeon: Kathi Der, MD;  Location: [facility];  Service: Gastroenterology;   Laterality: N/A;   CYSTOSCOPY N/A 01/20/2014   Procedure: CYSTOSCOPY WITH BOTOX INJECTION;  Surgeon: Kathi Ludwig, MD;  Location: [facility];  Service: Urology;  Laterality: N/A;   CYSTOSCOPY N/A 04/09/2015   Procedure: CYSTOSCOPY WITH BOTOX INJECTION;  Surgeon: Jethro Bolus, MD;  Location: [facility];  Service: Urology;  Laterality: N/A;   CYSTOSCOPY WITH INJECTION  10/11/2012   Procedure: CYSTOSCOPY WITH INJECTION;  Surgeon: Kathi Ludwig, MD;  Location: [facility];  Service: Urology;  Laterality: N/A;  botox   CYSTOSCOPY WITH INJECTION N/A 04/29/2016   Procedure: CYSTOSCOPY WITH INJECTION;  Surgeon: Jethro Bolus, MD;  Location: [facility];  Service: Urology;  Laterality: N/A;   surgery for spinal cord injury C 4 to C 6  2009   VASECTOMY Bilateral 05/14/2019   Procedure: VASECTOMY;  Surgeon: Alfredo Martinez, MD;  Location: [facility];  Service: Urology;  Laterality: Bilateral;   Patient Active Problem List   Diagnosis Date Noted   Hematochezia 02/04/2021   Hemorrhoids 02/04/2021   Left lower quadrant pain 02/04/2021   Rectal bleeding 02/04/2021   Rectal pain 02/04/2021   Urethral stricture due to infection 06/11/2020   Chronic pain syndrome 05/21/2020   Fall from, out of or through balcony, sequela 05/21/2020   Malnutrition of mild degree Lily Kocher: 75% to less than 90% of standard  weight) (HCC) 05/21/2020   Neurogenic bowel 05/21/2020   Polyp of colon 05/21/2020   Urinary incontinence 05/21/2020   Wheelchair dependence 05/21/2020   Acute pyelonephritis 11/28/2019   Pyelonephritis 11/28/2019   Urinary tract infection associated with catheterization of urinary tract, initial encounter (HCC) 11/27/2019   Neurogenic bladder    Hypotension    UTI (urinary tract infection) 05/28/2018   Leukocytosis 12/13/2016   Community acquired pneumonia of right lower lobe of lung    Quadriplegia (HCC)    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   CAP (community acquired pneumonia) 12/12/2016     ONSET DATE: 06/23/2023  REFERRING DIAG: R20.2 (ICD-10-CM) - Paresthesia of skin  THERAPY DIAG:  Muscle weakness (generalized)  Other lack of coordination  Other symptoms and signs involving the nervous system  Other symptoms and signs involving the musculoskeletal system  Rationale for Evaluation and Treatment: Rehabilitation  SUBJECTIVE:   SUBJECTIVE STATEMENT:  Patient apologized for recent missed visits but his daughter had been exposed to illness 2 weeks ago and then last week he developed an infection in his L thumb that required a tetanus shot and antibiotic which he recently finished.   Pt accompanied by: self  PERTINENT HISTORY:   PMHx of chronic pain syndrome, hypotension, incontinence, tetraplegia.  Patient reports a bicep/tricep tendon transfer surgery to give him more control of arm extension.  PRECAUTIONS: Fall and Other: Skin integrity  WEIGHT BEARING RESTRICTIONS:  Unable  PAIN:  Are you having pain? Yes: NPRS scale: 5/10 Pain location: lower back, shoulders, elbows and forearms Pain description: achy pain Aggravating factors: newer WC but is adapting better Relieving factors: stretching and loosening muscles Shoulders, elbows and forearms is regular chronic pian  FALLS: Has patient fallen in last 6 months? 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  PATIENT GOALS: Increased strength and coordination for increased use of his hands for daily activities and potential driving.  He uses his mouth for various activities and his  children are starting to copy him biting things.  OBJECTIVE:   HAND DOMINANCE: Right - writing and driving but Left hand is stronger to hold objects.  ADLs: Overall ADLs: Wife is his aide at home (paid position) Transfers/ambulation related to ADLs: Sliding board or squat pivot with extensive assistance from caregiver Eating: spouse cuts steak etc Grooming: can use electric razor himself UB Dressing: Can help move his arms etc during dressing LB Dressing: Completed in bed Toileting: Digital stimulation by caregiver every other day for BM.  He can self cath but has had an indwelling cath x 3 weeks - coming off today or tomorrow) Bathing: Assisted by spouse Tub Shower transfers: Squat pivot to the shower chair Equipment: Shower seat with back, Walk in shower, Reacher, Feeding equipment, and U-cuff  IADLs: Shopping: Spouse Light housekeeping: Spouse Meal Prep: Spouse Community mobility: Para transit ([employer] [place]) power WC Medication management: wife gets medicine for him and he takes  Landscape architect: Online - does  Handwriting:  NT  MOBILITY STATUS:  power WC  POSTURE COMMENTS:  rounded shoulders, forward head, decreased lumbar lordosis, increased thoracic kyphosis, and flexed trunk  Sitting balance:  Needs assistance.  Time constraints of eval did not allow for out of WC assessment - will explore further  ACTIVITY TOLERANCE: Activity tolerance: Limited   FUNCTIONAL OUTCOME MEASURES: Modified Barthel Index: Self Care Assessment - 21/100 Chair/Bed Transfers: 3/15 Ambulation: na/15 OR WC Mobility: 5/5 Stair Climbing: 0/10 Toilet Transfers: 2/10 Bowel Control: 2/10 Bladder Control: 0/10 Bathing: 1/5 Dressing: 2/10 Personal Hygiene: 1/5 Feeding: 5/10  Interpretation: 0-20 - Total Dependence 21-60 - Severe Dependence 61-90 - Moderate Dependence 91-99 - Slight Dependence 100 - Independence  UPPER EXTREMITY ROM:    Active ROM Right eval Left eval  Shoulder  flexion 90 90  Shoulder abduction 90 90  Shoulder adduction    Shoulder extension    Shoulder internal rotation    Shoulder external rotation    Elbow flexion [facility] WFL  Elbow extension Decreased end range   Wrist flexion    Wrist extension    Wrist ulnar deviation    Wrist radial deviation    Wrist pronation    Wrist supination    (Blank rows = not tested)  UPPER EXTREMITY MMT:     MMT Right eval Left eval  Shoulder flexion 3-/5 3-/5  Shoulder abduction 3-/5 3-/5  Shoulder adduction    Shoulder extension    Shoulder internal rotation    Shoulder external rotation    Middle trapezius    Lower trapezius    Elbow flexion 3/5 3/5  Elbow extension 3-/5   Wrist flexion N/a  against gravity  Wrist extension    Wrist ulnar deviation    Wrist radial deviation    Wrist pronation    Wrist supination    (Blank rows = not tested)  Patient has no active finger extension - digital position dependent on tenodesis  HAND FUNCTION:  Grip strength: unable to squeeze with wrist neutral with wrist extended he was able to squeeze enough to activate dynamometer - Right: 1.3 lbs, Left: 2.2 lbs  COORDINATION: NT  SENSATION: Light touch: Impaired  and Paraesthesia UE  EDEMA: NA  MUSCLE TONE: RUE: Moderate and Hypertonic and LUE: Moderate and Hypertonic  COGNITION: Overall cognitive status: Within functional limits for tasks assessed  VISION: Baseline vision: No visual deficits  VISION ASSESSMENT: Not tested  PERCEPTION: Not tested  PRAXIS: Not tested  OBSERVATIONS: Pt arrived in his new power WC with R hand control and access to his Quadtool - reacher across his lap.  Pt has decreased lumbar lordosis and obvious B tenodesis grip with the wrists extended and PIP/DIP flexion of digits.   TODAY'S TREATMENT:                                                                                                                               Self Care: Patient shown a couple of items  for consideration re: meal prep including small crock pot and water bottle opener which he could turn to open water bottle but would  need practice getting it on the top on his own.  Pt assisted with ADL transfers to/from power WC and therapy mat table for mat work with moderate assistance via sliding board.  Pt able to direct OT to lay him down briefly to relax LE spasms upon transfer to mat and then upon sitting up, spasms were gone.   Education provided re: transferring EOB activities to UB ADLs ie) unsupported sitting so his shirt could be placed over his head, reaching with only 1 hand support so he could push the other arm through the sleeve of his shirt or so he could bring a brush to his head etc.  Neuromuscular Re-ed:  Pt engaged in supported and unsupported sitting balance at EOB with close supervision and frequent physcial contact or assist from OTR to reposition UEs (R especially when leaning backwards) and to prevent LOB forward when lifting hands off the mat or off his knees.  Seated exercises required activation of core and abdominal muscles to actively reach in various directions forward and side to side/crossing midline. He is able to lean back through extended arms to hold himself in reclined position without physical support. He was engaged in sitting up as straight as possible on the side of the mat with his feet in the wheelchair foot rest. He worked slowly to decrease support from his arms to challenge his balance and to strengthen his posture muscles.    PATIENT EDUCATION: Education details: EOB activities for UB dressing etc. Person educated: Patient Education method: Solicitor, and Verbal cues Education comprehension: verbalized understanding, returned demonstration, verbal cues required, and needs further education  HOME EXERCISE PROGRAM: TBD   GOALS: Goals reviewed with patient? Yes  SHORT TERM GOALS: Target date: 08/11/23  Patient will demonstrate UE  HEP with 25% verbal cues or less for proper execution.  Baseline: New to outpatient OT  Goal status: IN Progress  Patient will be able to sit EOB with SBA x 5 minutes to assist with increased ease with sliding board transfers. Baseline: 08/31/23 - constant presence of caregiver/therapist with hand support/tactile input 95% of the time Goal status: IN Progress  3.  Patient will be aware of AE/modified setup/techniques to get things in/out or fridge/cabinet etc. Baseline: Relies on spouse Goal status: IN Progress  4.  Patient will be aware of AE/modified setup/techniques to minimize use of mouth/increase use of hands for daily activities  Baseline: Self reported concerns re: children copying him "biting" things and re: sanitariness of using his mouth. Goal status: IN Progress   LONG TERM GOALS: Target date: 09/08/23  Patient will be able to perform UE motions necessary for driving with hand controls.  Baseline: Access to vehicle with hand controls (different than previous controls he used) Goal status: IN Progress  2.  Patient will have resources needed for modifications or training for driving with SCI  Baseline: Uses public transportation.  Goal status: IN Progress  3.  Patient will be aware of AE and modified techniques to increase ability to make different foods safely with proper setup, access to equipment etc Baseline: Relies on spouse Goal status: INITIAL  ASSESSMENT:  CLINICAL IMPRESSION: Pt is 37 year old for occupational therapy treatment secondary to SCI with functional quadriplegia who was able to work on EOB activities today > 15 minutes with close supervision and support of OTR. Pt responded well to working on UE position, head, neck and trunk position, balance and reaching for functional ADLs with decreased physical caregiver support.  He will benefit from continued skilled OT services in the outpatient setting to work on impairments.    PERFORMANCE DEFICITS: in  functional skills including ADLs, IADLs, coordination, dexterity, sensation, tone, ROM, strength, pain, fascial restrictions, muscle spasms, flexibility, Fine motor control, Gross motor control, mobility, balance, body mechanics, endurance, continence, skin integrity, and UE functional use,   IMPAIRMENTS: are limiting patient from ADLs, IADLs, leisure, and social participation.   CO-MORBIDITIES: has co-morbidities such as chronic pain, paresthesia of Ues, neurogenic bladder  that affects occupational performance. Patient will benefit from skilled OT to address above impairments and improve overall function.  REHAB POTENTIAL: Fair chronicity of SCI   PLAN:  OT FREQUENCY: 1x/week  OT DURATION: 8 weeks  PLANNED INTERVENTIONS: self care/ADL training, therapeutic exercise, therapeutic activity, neuromuscular re-education, balance training, functional mobility training, splinting, electrical stimulation, patient/family education, coping strategies training, and DME and/or AE instructions  RECOMMENDED OTHER SERVICES: NA  CONSULTED AND AGREED WITH PLAN OF CARE: Patient  PLAN FOR NEXT SESSION:  ADL transfers and balance activities Further exploration of Hand control consideration (need pictures of vehicle or access to vehicle) AE needs for ADLs and kitchen etc  Continued Assess mobility in the kitchen Mug meal & crock pt ideas   Victorino Sparrow, OT 08/31/2023, 9:40 AM

## 2023-09-05 ENCOUNTER — Encounter: Payer: Medicare Other | Admitting: Occupational Therapy

## 2023-09-07 ENCOUNTER — Ambulatory Visit: Payer: Medicare Other | Admitting: Occupational Therapy

## 2023-09-12 ENCOUNTER — Encounter: Payer: Medicare Other | Admitting: Occupational Therapy

## 2023-09-14 ENCOUNTER — Ambulatory Visit: Payer: Medicare Other | Attending: Nurse Practitioner | Admitting: Occupational Therapy

## 2023-09-14 DIAGNOSIS — R29898 Other symptoms and signs involving the musculoskeletal system: Secondary | ICD-10-CM | POA: Insufficient documentation

## 2023-09-14 DIAGNOSIS — M6281 Muscle weakness (generalized): Secondary | ICD-10-CM | POA: Diagnosis present

## 2023-09-14 DIAGNOSIS — R29818 Other symptoms and signs involving the nervous system: Secondary | ICD-10-CM | POA: Diagnosis present

## 2023-09-14 DIAGNOSIS — R278 Other lack of coordination: Secondary | ICD-10-CM | POA: Diagnosis present

## 2023-09-14 NOTE — Therapy (Addendum)
OUTPATIENT OCCUPATIONAL THERAPY NEURO TREATMENT & PROGRESS REPORT  Patient Name: Curtis Mann MRN: [id number] DOB:1986-10-21, 37 y.o., male Today's Date: 09/14/2023  PCP: Dr. Darleene Cleaver (house calls)  REFERRING PROVIDER: Nechama Guard, FNP  END OF SESSION:  OT End of Session - 09/14/23 0752     Visit Number 5    Number of Visits 8   + evaluation   Date for OT Re-Evaluation 09/22/23    Authorization Type Medicare & Medicaid Covered 100%    Progress Note Due on Visit 10    OT Start Time 0755    OT Stop Time 0845    OT Time Calculation (min) 50 min    Equipment Utilized During Treatment UBE (Active Hand)    Activity Tolerance Patient tolerated treatment well    Behavior During Therapy [facility] for tasks assessed/performed              Past Medical History:  Diagnosis Date   Complication of anesthesia    in 2015 patient states he woke up immediately after surgery , 2016- took him 30 minutes to wake up    Hypotension occasional   Neurogenic bladder    foley and i and o caths   Neurogenic bladder    i and o caths 6-8 times day   Neurogenic bowel    bowel program with digital stimulation as needed   Neuromuscular disorder (HCC)    Quadriplegic uses electric wheelchair, [employer] or Two person Transfer   Quadriplegic spinal paralysis (HCC) 2009   Speech disturbance    Past Surgical History:  Procedure Laterality Date   bicep and tricept tendon transfers bilateral  2011   bladder botox surgery  2012   BOTOX INJECTION N/A 03/06/2018   Procedure: BOTOX INJECTION CYSTOSCOPY WITH FULGURATION;  Surgeon: Alfredo Martinez, MD;  Location: [facility];  Service: Urology;  Laterality: N/A;   BOTOX INJECTION N/A 05/14/2019   Procedure: CYSTOSCOPY BOTOX INJECTION;  Surgeon: Alfredo Martinez, MD;  Location: [facility];  Service: Urology;  Laterality: N/A;   COLONOSCOPY WITH PROPOFOL N/A 03/12/2021   Procedure: COLONOSCOPY WITH PROPOFOL;  Surgeon: Kathi Der, MD;  Location: [facility];   Service: Gastroenterology;  Laterality: N/A;   CYSTOSCOPY N/A 01/20/2014   Procedure: CYSTOSCOPY WITH BOTOX INJECTION;  Surgeon: Kathi Ludwig, MD;  Location: [facility];  Service: Urology;  Laterality: N/A;   CYSTOSCOPY N/A 04/09/2015   Procedure: CYSTOSCOPY WITH BOTOX INJECTION;  Surgeon: Jethro Bolus, MD;  Location: [facility];  Service: Urology;  Laterality: N/A;   CYSTOSCOPY WITH INJECTION  10/11/2012   Procedure: CYSTOSCOPY WITH INJECTION;  Surgeon: Kathi Ludwig, MD;  Location: [facility];  Service: Urology;  Laterality: N/A;  botox   CYSTOSCOPY WITH INJECTION N/A 04/29/2016   Procedure: CYSTOSCOPY WITH INJECTION;  Surgeon: Jethro Bolus, MD;  Location: [facility];  Service: Urology;  Laterality: N/A;   surgery for spinal cord injury C 4 to C 6  2009   VASECTOMY Bilateral 05/14/2019   Procedure: VASECTOMY;  Surgeon: Alfredo Martinez, MD;  Location: [facility];  Service: Urology;  Laterality: Bilateral;   Patient Active Problem List   Diagnosis Date Noted   Hematochezia 02/04/2021   Hemorrhoids 02/04/2021   Left lower quadrant pain 02/04/2021   Rectal bleeding 02/04/2021   Rectal pain 02/04/2021   Urethral stricture due to infection 06/11/2020   Chronic pain syndrome 05/21/2020   Fall from, out of or through balcony, sequela 05/21/2020   Malnutrition of mild degree Lily Kocher: 75% to less  than 90% of standard weight) (HCC) 05/21/2020   Neurogenic bowel 05/21/2020   Polyp of colon 05/21/2020   Urinary incontinence 05/21/2020   Wheelchair dependence 05/21/2020   Acute pyelonephritis 11/28/2019   Pyelonephritis 11/28/2019   Urinary tract infection associated with catheterization of urinary tract, initial encounter (HCC) 11/27/2019   Neurogenic bladder    Hypotension    UTI (urinary tract infection) 05/28/2018   Leukocytosis 12/13/2016   Community acquired pneumonia of right lower lobe of lung    Quadriplegia (HCC)    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   Pt accompanied by: self  PERTINENT HISTORY:   PMHx of chronic pain syndrome, hypotension, incontinence, tetraplegia.  Patient reports a bicep/tricep tendon transfer surgery to give him more control of arm extension.  PRECAUTIONS: Fall and Other: Skin integrity  WEIGHT BEARING RESTRICTIONS:  Unable  PAIN:  Are you having pain? Yes: NPRS scale: 4/10 Pain location: lower back, shoulders, elbows and forearms Pain description: achy pain Aggravating factors: newer WC but is adapting better Relieving factors: stretching and loosening muscles Shoulders, elbows and forearms is regular chronic pian  FALLS: Has patient fallen in last 6 months? 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  PATIENT GOALS: Increased strength and coordination for increased use of his hands for daily activities and potential driving.  He uses his mouth for various activities and his children are starting to copy him biting things.  OBJECTIVE:   HAND DOMINANCE: Right - writing and driving but Left hand is stronger to hold objects.  ADLs: Overall ADLs:  Wife is his aide at home (paid position) Transfers/ambulation related to ADLs: Sliding board or squat pivot with extensive assistance from caregiver Eating: spouse cuts steak etc Grooming: can use electric razor himself UB Dressing: Can help move his arms etc during dressing LB Dressing: Completed in bed Toileting: Digital stimulation by caregiver every other day for BM.  He can self cath but has had an indwelling cath x 3 weeks - coming off today or tomorrow) Bathing: Assisted by spouse Tub Shower transfers: Squat pivot to the shower chair Equipment: Shower seat with back, Walk in shower, Reacher, Feeding equipment, and U-cuff  IADLs: Shopping: Spouse Light housekeeping: Spouse Meal Prep: Spouse Community mobility: Para transit ([employer] [place]) power WC Medication management: wife gets medicine for him and he takes  Landscape architect: Online - does  Handwriting:  NT  MOBILITY STATUS:  power WC  POSTURE COMMENTS:  rounded shoulders, forward head, decreased lumbar lordosis, increased thoracic kyphosis, and flexed trunk  Sitting balance:  Needs assistance.  Time constraints of eval did not allow for out of WC assessment - will explore further  ACTIVITY  TOLERANCE: Activity tolerance: Limited   FUNCTIONAL OUTCOME MEASURES: Modified Barthel Index: Self Care Assessment - 21/100 Chair/Bed Transfers: 3/15 Ambulation: na/15 OR WC Mobility: 5/5 Stair Climbing: 0/10 Toilet Transfers: 2/10 Bowel Control: 2/10 Bladder Control: 0/10 Bathing: 1/5 Dressing: 2/10 Personal Hygiene: 1/5 Feeding: 5/10  Interpretation: 0-20 - Total Dependence 21-60 - Severe Dependence 61-90 - Moderate Dependence 91-99 - Slight Dependence 100 - Independence  UPPER EXTREMITY ROM:    Active ROM Right eval Left eval  Shoulder flexion 90 90  Shoulder abduction 90 90  Shoulder adduction    Shoulder extension    Shoulder internal rotation    Shoulder external rotation    Elbow flexion [facility] WFL  Elbow  extension Decreased end range   Wrist flexion    Wrist extension    Wrist ulnar deviation    Wrist radial deviation    Wrist pronation    Wrist supination    (Blank rows = not tested)  UPPER EXTREMITY MMT:     MMT Right eval Left eval  Shoulder flexion 3-/5 3-/5  Shoulder abduction 3-/5 3-/5  Shoulder adduction    Shoulder extension    Shoulder internal rotation    Shoulder external rotation    Middle trapezius    Lower trapezius    Elbow flexion 3/5 3/5  Elbow extension 3-/5   Wrist flexion N/a  against gravity  Wrist extension    Wrist ulnar deviation    Wrist radial deviation    Wrist pronation    Wrist supination    (Blank rows = not tested)  Patient has no active finger extension - digital position dependent on tenodesis  HAND FUNCTION:  Grip strength: unable to squeeze with wrist neutral with wrist extended he was able to squeeze enough to activate dynamometer - Right: 1.3 lbs, Left: 2.2 lbs  COORDINATION: NT  SENSATION: Light touch: Impaired  and Paraesthesia UE  EDEMA: NA  MUSCLE TONE: RUE: Moderate and Hypertonic and LUE: Moderate and Hypertonic  COGNITION: Overall cognitive status: Within functional limits for tasks assessed  VISION: Baseline vision: No visual deficits  VISION ASSESSMENT: Not tested  PERCEPTION: Not tested  PRAXIS: Not tested  OBSERVATIONS: Pt arrived in his new power WC with R hand control and access to his Quadtool - reacher across his lap.  Pt has decreased lumbar lordosis and obvious B tenodesis grip with the wrists extended and PIP/DIP flexion of digits.   TODAY'S TREATMENT:                                                                                                                               Self Care:  Patient asked for information re: AE considerations for opening water bottle, warming water for meals and information provided via email.  Pt practiced with water bottle opener today and is able to put it in  place on his water bottle.  He does need to place it a couple of time with  extra trials to successfully twist open the bottle.  In addition possible extension to stabilize device on edge of bottle would be helpful. Pt also interested in safety device for warming water for meals.  Reviewed motions needed for driving - L UE to have tri-pin adaptive handle to turn the steering wheel and R UE to have the handle to pronate/supinate to control gas and brake pedal.  Pt in need of UE activity tolerance to drive, even with adaptations, therefore trialled TE as noted below.  Therapeutic Exercise:  Pt completed arm bike while sitting for 2 minute increments with Active Hand on R UE and OTR providing stability with LUE.  RPMs vary from beginning of 2 minute increments and decreases over time before rest breaks.  OTR adjusted length of arm bike handles to increase ease of task (ie. Easier at shorter length).  Repeated tasks conducted over 15 minutes for endurance, ROM, and strengthening of BUEs. Pt most effective with pedaling back wards.  Intermittent repositioning assistance needed to maintain grasp.  Stability provided at backrest with a pillow rolled in the lumbar region to optimize anterior/posterior trunk position.    PATIENT EDUCATION: Education details: UBE Person educated: Patient Education method: Solicitor, and Verbal cues Education comprehension: verbalized understanding, returned demonstration, verbal cues required, and needs further education  HOME EXERCISE PROGRAM: TBD   GOALS: Goals reviewed with patient? Yes  SHORT TERM GOALS: Target date: 08/11/23  Patient will demonstrate UE HEP with 25% verbal cues or less for proper execution.  Baseline: New to outpatient OT  Goal status: MET  Patient will be able to sit EOB with SBA x 5 minutes to assist with increased ease with sliding board transfers. Baseline: 08/31/23 - constant presence of caregiver/therapist with hand  support/tactile input 95% of the time Goal status: IN Progress  3.  Patient will be aware of AE/modified setup/techniques to get things in/out or fridge/cabinet etc. Baseline: Relies on spouse Goal status: MET  4.  Patient will be aware of AE/modified setup/techniques to minimize use of mouth/increase use of hands for daily activities  Baseline: Self reported concerns re: children copying him "biting" things and re: sanitariness of using his mouth. Goal status: MET   LONG TERM GOALS: Target date: 09/08/23  Patient will be able to perform UE motions necessary for driving with hand controls.  Baseline: Access to vehicle with hand controls (different than previous controls he used) Goal status: IN Progress  2.  Patient will have resources needed for modifications or training for driving with SCI  Baseline: Uses public transportation.  Goal status: IN Progress  3.  Patient will be aware of AE and modified techniques to increase ability to make different foods safely with proper setup, access to equipment etc Baseline: Relies on spouse Goal status: IN Progress  ASSESSMENT:  CLINICAL IMPRESSION: Pt is 37 year old for occupational therapy treatment secondary to SCI with functional quadriplegia who was able to work on UBE in 2 minute increments with rest breaks with close supervision and positioning support of OTR. Pt responding well to TE ideas and is making appropriate inquires re AE.  He will benefit from continued skilled OT services in the outpatient setting to work on impairments.    This progress note is for dates: 07/11/23 to 09/14/2023. Pt has attended 4 treatment session s/p evaluation for 5 total visits and had to miss a couple visits due to health issues.  He has made progress in all STGs and LTGs and has met  3 of 7 established goals. Pt is making progress towards goals as expected and continues to benefit from skilled OT services in the outpatient setting to work towards remaining  goals or until max rehab potential is met.    PERFORMANCE DEFICITS: in functional skills including ADLs, IADLs, coordination, dexterity, sensation, tone, ROM, strength, pain, fascial restrictions, muscle spasms, flexibility, Fine motor control, Gross motor control, mobility, balance, body mechanics, endurance, continence, skin integrity, and UE functional use,   IMPAIRMENTS: are limiting patient from ADLs, IADLs, leisure, and social participation.   CO-MORBIDITIES: has co-morbidities such as chronic pain, paresthesia of Ues, neurogenic bladder  that affects occupational performance. Patient will benefit from skilled OT to address above impairments and improve overall function.  REHAB POTENTIAL: Fair chronicity of SCI   PLAN:  OT FREQUENCY: 1x/week  OT DURATION: additional 8 weeks   PLANNED INTERVENTIONS: self care/ADL training, therapeutic exercise, therapeutic activity, neuromuscular re-education, balance training, functional mobility training, splinting, electrical stimulation, patient/family education, coping strategies training, and DME and/or AE instructions  RECOMMENDED OTHER SERVICES: NA  CONSULTED AND AGREED WITH PLAN OF CARE: Patient  PLAN FOR NEXT SESSION:   UE strengthening ADL transfers and balance activities for car transfers Further exploration of Hand control consideration (need pictures of vehicle or access to vehicle) AE needs for ADLs and kitchen etc     Victorino Sparrow, OT 09/14/2023, 10:38 AM

## 2023-09-19 ENCOUNTER — Encounter: Payer: Medicare Other | Admitting: Occupational Therapy

## 2023-09-21 ENCOUNTER — Ambulatory Visit: Payer: Medicare Other | Admitting: Occupational Therapy

## 2023-09-21 DIAGNOSIS — R29818 Other symptoms and signs involving the nervous system: Secondary | ICD-10-CM

## 2023-09-21 DIAGNOSIS — R278 Other lack of coordination: Secondary | ICD-10-CM | POA: Diagnosis not present

## 2023-09-21 DIAGNOSIS — R29898 Other symptoms and signs involving the musculoskeletal system: Secondary | ICD-10-CM

## 2023-09-21 DIAGNOSIS — M6281 Muscle weakness (generalized): Secondary | ICD-10-CM

## 2023-09-21 NOTE — Addendum Note (Signed)
Addended by: Victorino Sparrow on: 09/21/2023 09:49 AM   Modules accepted: Orders

## 2023-09-21 NOTE — Therapy (Signed)
OUTPATIENT OCCUPATIONAL THERAPY NEURO TREATMENT   Patient Name: Curtis Mann MRN: [id number] DOB:06-12-1986, 37 y.o., male Today's Date: 09/21/2023  PCP: Dr. Darleene Cleaver (house calls)  REFERRING PROVIDER: Nechama Guard, FNP  END OF SESSION:  OT End of Session - 09/21/23 0754     Visit Number 6    Number of Visits 14   + evaluation   Date for OT Re-Evaluation 11/17/23    Authorization Type Medicare & Medicaid Covered 100%    Progress Note Due on Visit 14    OT Start Time 0800    OT Stop Time 0845    OT Time Calculation (min) 45 min    Equipment Utilized During Treatment UBE (Active Hand), over the door pulley    Activity Tolerance Patient tolerated treatment well    Behavior During Therapy [facility] for tasks assessed/performed              Past Medical History:  Diagnosis Date   Complication of anesthesia    in 2015 patient states he woke up immediately after surgery , 2016- took him 30 minutes to wake up    Hypotension occasional   Neurogenic bladder    foley and i and o caths   Neurogenic bladder    i and o caths 6-8 times day   Neurogenic bowel    bowel program with digital stimulation as needed   Neuromuscular disorder (HCC)    Quadriplegic uses electric wheelchair, [employer] or Two person Transfer   Quadriplegic spinal paralysis (HCC) 2009   Speech disturbance    Past Surgical History:  Procedure Laterality Date   bicep and tricept tendon transfers bilateral  2011   bladder botox surgery  2012   BOTOX INJECTION N/A 03/06/2018   Procedure: BOTOX INJECTION CYSTOSCOPY WITH FULGURATION;  Surgeon: Alfredo Martinez, MD;  Location: [facility];  Service: Urology;  Laterality: N/A;   BOTOX INJECTION N/A 05/14/2019   Procedure: CYSTOSCOPY BOTOX INJECTION;  Surgeon: Alfredo Martinez, MD;  Location: [facility];  Service: Urology;  Laterality: N/A;   COLONOSCOPY WITH PROPOFOL N/A 03/12/2021   Procedure: COLONOSCOPY WITH PROPOFOL;  Surgeon: Kathi Der, MD;  Location: [facility];  Service: Gastroenterology;  Laterality: N/A;   CYSTOSCOPY N/A 01/20/2014   Procedure: CYSTOSCOPY WITH BOTOX INJECTION;  Surgeon: Kathi Ludwig, MD;  Location: [facility];  Service: Urology;  Laterality: N/A;   CYSTOSCOPY N/A 04/09/2015   Procedure: CYSTOSCOPY WITH BOTOX INJECTION;  Surgeon: Jethro Bolus, MD;  Location: [facility];  Service: Urology;  Laterality: N/A;   CYSTOSCOPY WITH INJECTION  10/11/2012   Procedure: CYSTOSCOPY WITH INJECTION;  Surgeon: Kathi Ludwig, MD;  Location: [facility];  Service: Urology;  Laterality: N/A;  botox   CYSTOSCOPY WITH INJECTION N/A 04/29/2016   Procedure: CYSTOSCOPY WITH INJECTION;  Surgeon: Jethro Bolus, MD;  Location: [facility];  Service: Urology;  Laterality: N/A;   surgery for spinal cord injury C 4 to C 6  2009   VASECTOMY Bilateral 05/14/2019   Procedure: VASECTOMY;  Surgeon: Alfredo Martinez, MD;  Location: [facility];  Service: Urology;  Laterality: Bilateral;   Patient Active Problem List   Diagnosis Date Noted   Hematochezia 02/04/2021   Hemorrhoids 02/04/2021   Left lower quadrant pain 02/04/2021   Rectal bleeding 02/04/2021   Rectal pain 02/04/2021   Urethral stricture due to infection 06/11/2020   Chronic pain syndrome 05/21/2020   Fall from, out of or through balcony, sequela 05/21/2020   Malnutrition of mild degree Lily Kocher: 75%  to less than 90% of standard weight) (HCC) 05/21/2020   Neurogenic bowel 05/21/2020   Polyp of colon 05/21/2020   Urinary incontinence 05/21/2020   Wheelchair dependence 05/21/2020   Acute pyelonephritis 11/28/2019   Pyelonephritis 11/28/2019   Urinary tract infection associated with catheterization of urinary tract, initial encounter (HCC) 11/27/2019   Neurogenic bladder    Hypotension    UTI (urinary tract infection) 05/28/2018   Leukocytosis 12/13/2016   Community acquired pneumonia of right lower lobe of lung    Quadriplegia (HCC)    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   CAP  (community acquired pneumonia) 12/12/2016    ONSET DATE: 06/23/2023  REFERRING DIAG: R20.2 (ICD-10-CM) - Paresthesia of skin  THERAPY DIAG:  Muscle weakness (generalized)  Other lack of coordination  Other symptoms and signs involving the nervous system  Other symptoms and signs involving the musculoskeletal system  Rationale for Evaluation and Treatment: Rehabilitation  SUBJECTIVE:   SUBJECTIVE STATEMENT:  Patient reported that he feel really good after exercises last week on the [employer] and had been looking for something to use at home with 'vertical' hands as they do not have one like it at the gym.   Upon discussion re: continued therapy, pt is going to take   Pt accompanied by: self  PERTINENT HISTORY:   PMHx of chronic pain syndrome, hypotension, incontinence, tetraplegia.  Patient reports a bicep/tricep tendon transfer surgery to give him more control of arm extension.  PRECAUTIONS: Fall and Other: Skin integrity  WEIGHT BEARING RESTRICTIONS:  Unable  PAIN:  Are you having pain? Yes: NPRS scale: 4/10 Pain location: lower back, shoulders, elbows and forearms Pain description: achy pain Aggravating factors: newer WC but is adapting better Relieving factors: stretching and loosening muscles Shoulders, elbows and forearms is regular chronic pian  FALLS: Has patient fallen in last 6 months? 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  PATIENT GOALS: Increased strength and coordination for increased use of his hands for daily activities and  potential driving.  He uses his mouth for various activities and his children are starting to copy him biting things.  OBJECTIVE:   HAND DOMINANCE: Right - writing and driving but Left hand is stronger to hold objects.  ADLs: Overall ADLs: Wife is his aide at home (paid position) Transfers/ambulation related to ADLs: Sliding board or squat pivot with extensive assistance from caregiver Eating: spouse cuts steak etc Grooming: can use electric razor himself UB Dressing: Can help move his arms etc during dressing LB Dressing: Completed in bed Toileting: Digital stimulation by caregiver every other day for BM.  He can self cath but has had an indwelling cath x 3 weeks - coming off today or tomorrow) Bathing: Assisted by spouse Tub Shower transfers: Squat pivot to the shower chair Equipment: Shower seat with back, Walk in shower, Reacher, Feeding equipment, and U-cuff  IADLs: Shopping: Spouse Light housekeeping: Spouse Meal Prep: Spouse Community mobility: Para transit ([employer] [place]) power WC Medication management: wife gets medicine for him and he takes  Landscape architect: Online - does  Handwriting:  NT  MOBILITY STATUS:  power WC  POSTURE COMMENTS:  rounded shoulders, forward head, decreased lumbar lordosis, increased thoracic kyphosis, and flexed trunk  Sitting balance:  Needs assistance.  Time constraints of eval did not allow for out of WC assessment - will explore further  ACTIVITY TOLERANCE: Activity tolerance: Limited   FUNCTIONAL OUTCOME MEASURES: Modified Barthel Index: Self Care Assessment - 21/100 Chair/Bed Transfers: 3/15 Ambulation: na/15 OR WC Mobility: 5/5 Stair Climbing: 0/10 Toilet Transfers: 2/10 Bowel Control: 2/10 Bladder Control: 0/10 Bathing: 1/5 Dressing: 2/10 Personal Hygiene: 1/5 Feeding: 5/10  Interpretation: 0-20 - Total Dependence 21-60 - Severe Dependence 61-90 - Moderate Dependence 91-99 - Slight Dependence 100 - Independence  UPPER  EXTREMITY ROM:    Active ROM Right eval Left eval  Shoulder flexion 90 90  Shoulder abduction 90 90  Shoulder adduction    Shoulder extension    Shoulder internal rotation    Shoulder external rotation    Elbow flexion [facility] WFL  Elbow extension Decreased end range   Wrist flexion    Wrist extension    Wrist ulnar deviation    Wrist radial deviation    Wrist pronation    Wrist supination    (Blank rows = not tested)  UPPER EXTREMITY MMT:     MMT Right eval Left eval  Shoulder flexion 3-/5 3-/5  Shoulder abduction 3-/5 3-/5  Shoulder adduction    Shoulder extension    Shoulder internal rotation    Shoulder external rotation    Middle trapezius    Lower trapezius    Elbow flexion 3/5 3/5  Elbow extension 3-/5   Wrist flexion N/a  against gravity  Wrist extension    Wrist ulnar deviation    Wrist radial deviation    Wrist pronation    Wrist supination    (Blank rows = not tested)  Patient has no active finger extension - digital position dependent on tenodesis  HAND FUNCTION:  Grip strength: unable to squeeze with wrist neutral with wrist extended he was able to squeeze enough to activate dynamometer - Right: 1.3 lbs, Left: 2.2 lbs  COORDINATION: NT  SENSATION: Light touch: Impaired  and Paraesthesia UE  EDEMA: NA  MUSCLE TONE: RUE: Moderate and Hypertonic and LUE: Moderate and Hypertonic  COGNITION: Overall cognitive status: Within functional limits for tasks assessed  VISION: Baseline vision: No visual deficits  VISION ASSESSMENT: Not tested  PERCEPTION: Not tested  PRAXIS: Not tested  OBSERVATIONS: Pt arrived in his new power WC with R hand control and access to his Quadtool - reacher across his lap.  Pt has decreased lumbar lordosis and obvious B tenodesis grip with the wrists extended and PIP/DIP flexion of digits.   TODAY'S TREATMENT:                                                                                                                                 Therapeutic Exercise:  Pt completed arm bike seated in WC 2-3 minute increments  with B Active Hands in place for stability throughout UE bicycle motions.  RPMs maintained more consistent today with repeated pedaling conducted over 10 minutes for endurance, ROM, and strengthening of BUEs with rest breaks. Pt most effective with pedaling back wards but today was able to pedal forward in 1 minute increments.   Stability provided at backrest with a towel  rolled in the lumbar region to optimize upright trunk position.   Patient then trialled over the door pulley hooked over table edge without need for Active Hand as pt is able to hook the handle inside his hand.  Pt able to move BUEs slow and steady > 5 minute increments. Also trialled over the door and than backed up from the door to stretch elbow straighter ie) stretching hand further forward/upward, and to keep his head up and shoulders as relaxed as possible for maximize comfort and stretch.    Therapeutic Activities:  Reviewed OT POC/goals for continued therapy needs with patient still in need of further info re: AE considerations ie) safety device for warming water for meals as well as   Reviewed motions needed for driving - L UE to have tri-pin adaptive handle to turn the steering wheel and R UE to have the handle to pronate/supinate to control gas and brake pedal.  Pt in need of UE activity tolerance to drive, even with adaptations, therefore trialled TE as noted below.  PATIENT EDUCATION: Education details: UBE Person educated: Patient Education method: Solicitor, and Verbal cues Education comprehension: verbalized understanding, returned demonstration, verbal cues required, and needs further education  HOME EXERCISE PROGRAM: TBD   GOALS: Goals reviewed with patient? Yes  SHORT TERM GOALS: Target date: 08/11/23  Patient will demonstrate UE HEP with 25% verbal cues or less for proper  execution.  Baseline: New to outpatient OT  Goal status: MET  Patient will be able to sit EOB with SBA x 5 minutes to assist with increased ease with sliding board transfers. Baseline: 08/31/23 - constant presence of caregiver/therapist with hand support/tactile input 95% of the time Goal status: IN Progress  3.  Patient will be aware of AE/modified setup/techniques to get things in/out or fridge/cabinet etc. Baseline: Relies on spouse Goal status: MET  4.  Patient will be aware of AE/modified setup/techniques to minimize use of mouth/increase use of hands for daily activities  Baseline: Self reported concerns re: children copying him "biting" things and re: sanitariness of using his mouth. Goal status: MET   LONG TERM GOALS: Target date: 09/08/23  Patient will be able to perform UE motions necessary for driving with hand controls.  Baseline: Access to vehicle with hand controls (different than previous controls he used) Goal status: MET - can perform motions - working on endurance for prolonged use of BUEs with driving.  2.  Patient will have resources needed for modifications or training for driving with SCI  Baseline: Uses public transportation.  Goal status: IN Progress  3.  Patient will be aware of AE and modified techniques to increase ability to make different foods safely with proper setup, access to equipment etc Baseline: Relies on spouse Goal status: IN Progress  ASSESSMENT:  CLINICAL IMPRESSION: Pt is 37 year old for occupational therapy treatment secondary to SCI with functional quadriplegia who was able to work on UBE in 2-3 minute increments today with rest breaks with cues and guidance to ease discomfort in L UE today.  He did well with over the door pulley activity and is eager to  get one for home use.  Pt responding well to TE ideas and is making appropriate inquires re AE.  He will benefit from continued skilled OT services in the outpatient setting to work on  impairments.    PERFORMANCE DEFICITS: in functional skills including ADLs, IADLs, coordination, dexterity, sensation, tone, ROM, strength, pain, fascial restrictions, muscle spasms, flexibility, Fine motor control, Gross motor control, mobility, balance, body mechanics, endurance, continence, skin integrity, and UE functional use,   IMPAIRMENTS: are limiting patient from ADLs, IADLs, leisure, and social participation.   CO-MORBIDITIES: has co-morbidities such as chronic pain, paresthesia of Ues, neurogenic bladder  that affects occupational performance. Patient will benefit from skilled OT to address above impairments and improve overall function.  REHAB POTENTIAL: Fair chronicity of SCI   PLAN:  OT FREQUENCY: 1x/week  OT DURATION: 8 weeks  PLANNED INTERVENTIONS: self care/ADL training, therapeutic exercise, therapeutic activity, neuromuscular re-education, balance training, functional mobility training, splinting, electrical stimulation, patient/family education, coping strategies training, and DME and/or AE instructions  RECOMMENDED OTHER SERVICES: NA  CONSULTED AND AGREED WITH PLAN OF CARE: Patient  PLAN FOR NEXT SESSION:   Modified UE strengthening ADL transfers and balance activities Further exploration of Hand control consideration (need pictures of vehicle or access to vehicle) AE needs for ADLs and kitchen etc     Victorino Sparrow, OT 09/21/2023, 12:19 PM

## 2023-10-12 ENCOUNTER — Ambulatory Visit: Payer: Medicare Other | Admitting: Occupational Therapy

## 2024-01-24 ENCOUNTER — Encounter: Payer: Self-pay | Admitting: Occupational Therapy

## 2024-01-24 NOTE — Therapy (Signed)
[facility] [address] [place], [place], [address] Phone: [phone]   Fax:  [phone]  Patient Details  Name: Curtis Mann MRN: [id number] Date of Birth: 1986/07/24 Referring Provider:  No ref. provider found  OCCUPATIONAL THERAPY DISCHARGE SUMMARY  Visits from Start of Care: 6  Current functional level related to goals / functional outcomes: Pt had met 3/4 short term goals and 1/3 long term goals at time of last visit/progress note 09/21/23 and was pleased with outcomes and education provided.  Continued OT was discussed at that time and an additional appt made but pt no showed and no further contact received from pt to rescheduled appts, therefore this note is prepared to end his OT episode of care.  Remaining deficits: Pt had various functional deficits and pain related to his spinal cord injury and quadriplegia.   Education / Equipment: Pt has various need materials and education re: AE provided to continue with self-management. See tx notes for more details.   Patient discharged at this time due to end of OT episode of care/POC.  Pt will need a new referral to outpatient occupational therapy if he desires continued care in the future.     Victorino Sparrow, OT 01/24/2024, 10:10 AM  [facility] [address] [place], [place], [address] Phone: [phone]   Fax:  [phone]

## 2024-03-07 ENCOUNTER — Ambulatory Visit (INDEPENDENT_AMBULATORY_CARE_PROVIDER_SITE_OTHER): Admitting: Podiatry

## 2024-03-07 ENCOUNTER — Encounter: Payer: Self-pay | Admitting: Podiatry

## 2024-03-07 DIAGNOSIS — L600 Ingrowing nail: Secondary | ICD-10-CM

## 2024-03-07 NOTE — Patient Instructions (Signed)

## 2024-03-07 NOTE — Progress Notes (Signed)
 Subjective:   Patient ID: Curtis Mann, male   DOB: 38 y.o.   MRN: [id number]   HPI Patient presents in wheelchair paraplegic with nail disease bilateral and concerns about a loose big toenail left and second nail right   ROS      Objective:  Physical Exam  Neurovascular status intact for this patient no muscle strength noted range of motion diminished in wheelchair with nail disease 1-5 both feet with damaged ingrown left hallux and second nail right     Assessment:  Patient with difficult health history with nail disease ingrown toenail deformity     Plan:  H&P reviewed ingrown toenail deformity discussed possible permanent removal MR going to hold off currently and consider that at 1 time in future.  I went ahead today and I did do a courtesy debridement and several nails came off on their own with treatment
